# Patient Record
Sex: Male | Born: 1960 | Race: Asian | Hispanic: No | Marital: Married | Smoking: Current every day smoker
Health system: Southern US, Community
[De-identification: ages and names within clinical notes are randomized; demographics above are authoritative.]

## PROBLEM LIST (undated history)

## (undated) DIAGNOSIS — G4700 Insomnia, unspecified: Secondary | ICD-10-CM

## (undated) DIAGNOSIS — E119 Type 2 diabetes mellitus without complications: Secondary | ICD-10-CM

## (undated) DIAGNOSIS — E041 Nontoxic single thyroid nodule: Secondary | ICD-10-CM

## (undated) DIAGNOSIS — E785 Hyperlipidemia, unspecified: Secondary | ICD-10-CM

## (undated) DIAGNOSIS — I2510 Atherosclerotic heart disease of native coronary artery without angina pectoris: Secondary | ICD-10-CM

## (undated) DIAGNOSIS — E039 Hypothyroidism, unspecified: Secondary | ICD-10-CM

## (undated) DIAGNOSIS — K219 Gastro-esophageal reflux disease without esophagitis: Secondary | ICD-10-CM

## (undated) HISTORY — DX: Type 2 diabetes mellitus without complications: E11.9

## (undated) HISTORY — DX: Hypothyroidism, unspecified: E03.9

## (undated) HISTORY — DX: Hyperlipidemia, unspecified: E78.5

## (undated) HISTORY — DX: Gastro-esophageal reflux disease without esophagitis: K21.9

---

## 2002-05-21 ENCOUNTER — Emergency Department (HOSPITAL_COMMUNITY): Admission: EM | Admit: 2002-05-21 | Discharge: 2002-05-21 | Payer: Self-pay | Admitting: Emergency Medicine

## 2002-10-15 ENCOUNTER — Encounter: Payer: Self-pay | Admitting: Emergency Medicine

## 2002-10-15 ENCOUNTER — Emergency Department (HOSPITAL_COMMUNITY): Admission: EM | Admit: 2002-10-15 | Discharge: 2002-10-15 | Payer: Self-pay | Admitting: Emergency Medicine

## 2020-02-25 ENCOUNTER — Other Ambulatory Visit: Payer: Self-pay | Admitting: Internal Medicine

## 2020-02-25 DIAGNOSIS — E042 Nontoxic multinodular goiter: Secondary | ICD-10-CM

## 2020-03-02 ENCOUNTER — Ambulatory Visit
Admission: RE | Admit: 2020-03-02 | Discharge: 2020-03-02 | Disposition: A | Discharge status: Home / Self Care | Payer: BC Managed Care – PPO | Source: Ambulatory Visit | Attending: Internal Medicine | Admitting: Internal Medicine

## 2020-03-02 DIAGNOSIS — E042 Nontoxic multinodular goiter: Secondary | ICD-10-CM

## 2020-03-09 NOTE — Progress Notes (Signed)
Patient referred by Julian Hy, PA* for chest pain  Subjective:   Charles Dorsey, male    DOB: 11/28/1960, 59 y.o.   MRN: [id number]   Chief Complaint  Patient presents with  . Chest Pain  . New Patient (Initial Visit)     HPI  59 y.o. [place] male with hypertension, hyperlipidemia, type 2 DM, family history of CAD, referred for evaluation of chest pain.  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  He has noticed left-sided chest pain only with physical activity such as climbing up stairs or lifting any heavy objects.  Episodes last for 1 to 2 minutes, and resolve with rest.  While he has had these episodes about once a month for last year or so, they have been frequent, occurring on a daily basis for last week or 2.  Patient is on treatment for diabetes with Metformin, with improvement in his A1c, as reported by him.  He has been off his lipid-lowering pravastatin medication for 2 weeks.  He is not on any antihypertensive therapy at baseline, and states that his blood pressure is usually normal.  He smokes 3 cigarettes a day for last 35 years.  His father had CABG at age 47.   Past Medical History:  Diagnosis Date  . Acid reflux   . Diabetes mellitus without complication ([place])   . Hyperlipidemia   . Hypothyroidism      History reviewed. No pertinent surgical history.   Social History   Tobacco Use  Smoking Status Current Every Day Smoker  Smokeless Tobacco Never Used    Social History   Substance and Sexual Activity  Alcohol Use Not on file     Family History  Problem Relation Age of Onset  . Heart disease Father   . Diabetes Father   . Hyperlipidemia Father      Current Outpatient Medications on File Prior to Visit  Medication Sig Dispense Refill  . DEXILANT 60 MG capsule Take 1 capsule by mouth daily.    Marland Kitchen levothyroxine (SYNTHROID) 50 MCG tablet Take 50 mcg by mouth every morning.     . metFORMIN (GLUCOPHAGE) 500 MG tablet Take 500 mg by mouth 2 (two) times daily.    . pravastatin (PRAVACHOL) 10 MG tablet Take 10 mg by mouth at bedtime.     No current facility-administered medications on file prior to visit.    Cardiovascular and other pertinent studies:  EKG 03/10/2020: Sinus rhythm 72 bpm. Nonspecific T wave changes anteroseptal leads.   Recent labs: 02/01/2020: Glucose 142, BUN/Cr 19/1.0. EGFR 77. Na/K 137/4.3. Rest of the CMP normal H/H 15/47. MCV 94. Platelets 254 HbA1C N/A Chol 199, TG 112, HDL 44, LDL 133 TSH 4.3 normal    Review of Systems  Cardiovascular: Positive for chest pain. Negative for dyspnea on exertion, leg swelling, palpitations and syncope.         Vitals:   03/10/20 0831 03/10/20 0837  BP: (!) 153/98 (!) 140/92  Pulse: 73 74  Resp: 17   Temp: (!) 93.5 F (34.2 C)   SpO2: 97%     Body mass index is 23.89 kg/m. Filed Weights   03/10/20 0831  Weight: 148 lb (67.1 kg)     Objective:   Physical Exam  Constitutional: He appears well-developed and well-nourished.  Neck: No JVD present.  Cardiovascular: Normal rate, regular rhythm, normal heart sounds and intact distal pulses.  No murmur heard.  Pulmonary/Chest: Effort normal and breath sounds normal. He has no wheezes. He has no rales.  Musculoskeletal:        General: No edema.  Nursing note and vitals reviewed.        Assessment & Recommendations:   59 y.o. [place] male with hypertension, hyperlipidemia, type 2 DM, family history of CAD, referred for evaluation of chest pain.  Angina: Symptoms very typical of angina.  While he likely has stable angina for several months, his recent increase in frequency of symptoms are suggestive of unstable angina. Started aspirin 81 mg daily, Lipitor 40 mg daily, indoor 30 mg daily, and as needed sublingual nitroglycerin. I discussed management options including medical therapy and noninvasive testing versus medical therapy  and early invasive approach.  Given his unstable angina picture, early invasive approach is recommended. I have informed the patient that if he has resolution of symptoms on above medical therapy, he can contact us.  In that case, I will obtain noninvasive testing and see him for follow-up.  Otherwise, proceed with coronary angiography and possible intervention for next week.   Continue management of DM as per PCP recommendations.   Thank you for referring the patient to [place]. Please feel free to contact with any questions.  [name], MD [facility] Pager: [phone] Office: [phone]

## 2020-03-10 ENCOUNTER — Other Ambulatory Visit: Payer: Self-pay | Admitting: Internal Medicine

## 2020-03-10 ENCOUNTER — Encounter: Payer: Self-pay | Admitting: Cardiology

## 2020-03-10 ENCOUNTER — Ambulatory Visit: Payer: BC Managed Care – PPO | Admitting: Cardiology

## 2020-03-10 ENCOUNTER — Other Ambulatory Visit: Payer: Self-pay

## 2020-03-10 VITALS — BP 140/92 | HR 74 | Temp 93.5°F | Resp 17 | Ht 66.0 in | Wt 148.0 lb

## 2020-03-10 DIAGNOSIS — I200 Unstable angina: Secondary | ICD-10-CM

## 2020-03-10 DIAGNOSIS — R079 Chest pain, unspecified: Secondary | ICD-10-CM | POA: Insufficient documentation

## 2020-03-10 DIAGNOSIS — E042 Nontoxic multinodular goiter: Secondary | ICD-10-CM

## 2020-03-10 MED ORDER — ISOSORBIDE MONONITRATE ER 30 MG PO TB24: 30.0000 mg | ORAL_TABLET | Freq: Every day | ORAL | 3 refills | Status: DC

## 2020-03-10 MED ORDER — ASPIRIN EC 81 MG PO TBEC: 81.0000 mg | DELAYED_RELEASE_TABLET | Freq: Every day | ORAL | 2 refills | Status: DC

## 2020-03-10 MED ORDER — ATORVASTATIN CALCIUM 40 MG PO TABS: 40.0000 mg | ORAL_TABLET | Freq: Every day | ORAL | 2 refills | Status: DC

## 2020-03-10 MED ORDER — NITROGLYCERIN 0.4 MG SL SUBL: 0.4000 mg | SUBLINGUAL_TABLET | SUBLINGUAL | 3 refills | Status: DC | PRN

## 2020-03-15 ENCOUNTER — Other Ambulatory Visit (HOSPITAL_COMMUNITY)
Admission: RE | Admit: 2020-03-15 | Discharge: 2020-03-15 | Disposition: A | Discharge status: Home / Self Care | Payer: BC Managed Care – PPO | Source: Ambulatory Visit | Attending: Cardiology | Admitting: Cardiology

## 2020-03-15 DIAGNOSIS — Z01812 Encounter for preprocedural laboratory examination: Secondary | ICD-10-CM | POA: Diagnosis present

## 2020-03-15 DIAGNOSIS — Z20822 Contact with and (suspected) exposure to covid-19: Secondary | ICD-10-CM | POA: Diagnosis not present

## 2020-03-15 LAB — SARS CORONAVIRUS 2 (TAT 6-24 HRS): SARS Coronavirus 2: NEGATIVE

## 2020-03-16 LAB — CBC
Hematocrit: 45.9 % (ref 37.5–51.0)
Hemoglobin: 15.5 g/dL (ref 13.0–17.7)
MCH: 30.9 pg (ref 26.6–33.0)
MCHC: 33.8 g/dL (ref 31.5–35.7)
MCV: 92 fL (ref 79–97)
Platelets: 282 10*3/uL (ref 150–450)
RBC: 5.01 x10E6/uL (ref 4.14–5.80)
RDW: 12 % (ref 11.6–15.4)
WBC: 5.9 10*3/uL (ref 3.4–10.8)

## 2020-03-18 ENCOUNTER — Ambulatory Visit (HOSPITAL_COMMUNITY)
Admission: RE | Admit: 2020-03-18 | Discharge: 2020-03-19 | Disposition: A | Discharge status: Home / Self Care | Payer: BC Managed Care – PPO | Attending: Cardiology | Admitting: Cardiology

## 2020-03-18 ENCOUNTER — Encounter (HOSPITAL_COMMUNITY)
Admission: RE | Disposition: A | Discharge status: Home / Self Care | Payer: Self-pay | Source: Home / Self Care | Attending: Cardiology

## 2020-03-18 ENCOUNTER — Other Ambulatory Visit: Payer: Self-pay

## 2020-03-18 DIAGNOSIS — R079 Chest pain, unspecified: Secondary | ICD-10-CM

## 2020-03-18 DIAGNOSIS — I10 Essential (primary) hypertension: Secondary | ICD-10-CM | POA: Insufficient documentation

## 2020-03-18 DIAGNOSIS — K219 Gastro-esophageal reflux disease without esophagitis: Secondary | ICD-10-CM | POA: Diagnosis not present

## 2020-03-18 DIAGNOSIS — Z8249 Family history of ischemic heart disease and other diseases of the circulatory system: Secondary | ICD-10-CM | POA: Diagnosis not present

## 2020-03-18 DIAGNOSIS — F17210 Nicotine dependence, cigarettes, uncomplicated: Secondary | ICD-10-CM | POA: Diagnosis not present

## 2020-03-18 DIAGNOSIS — Z7982 Long term (current) use of aspirin: Secondary | ICD-10-CM | POA: Insufficient documentation

## 2020-03-18 DIAGNOSIS — I25110 Atherosclerotic heart disease of native coronary artery with unstable angina pectoris: Secondary | ICD-10-CM | POA: Diagnosis present

## 2020-03-18 DIAGNOSIS — E785 Hyperlipidemia, unspecified: Secondary | ICD-10-CM

## 2020-03-18 DIAGNOSIS — Z79890 Hormone replacement therapy (postmenopausal): Secondary | ICD-10-CM | POA: Diagnosis not present

## 2020-03-18 DIAGNOSIS — S55091A Other specified injury of ulnar artery at forearm level, right arm, initial encounter: Secondary | ICD-10-CM | POA: Diagnosis not present

## 2020-03-18 DIAGNOSIS — E039 Hypothyroidism, unspecified: Secondary | ICD-10-CM | POA: Diagnosis not present

## 2020-03-18 DIAGNOSIS — E119 Type 2 diabetes mellitus without complications: Secondary | ICD-10-CM | POA: Insufficient documentation

## 2020-03-18 DIAGNOSIS — Z79899 Other long term (current) drug therapy: Secondary | ICD-10-CM | POA: Diagnosis not present

## 2020-03-18 DIAGNOSIS — S5011XA Contusion of right forearm, initial encounter: Secondary | ICD-10-CM | POA: Insufficient documentation

## 2020-03-18 DIAGNOSIS — Z955 Presence of coronary angioplasty implant and graft: Secondary | ICD-10-CM

## 2020-03-18 DIAGNOSIS — X58XXXA Exposure to other specified factors, initial encounter: Secondary | ICD-10-CM | POA: Insufficient documentation

## 2020-03-18 DIAGNOSIS — Z7984 Long term (current) use of oral hypoglycemic drugs: Secondary | ICD-10-CM | POA: Insufficient documentation

## 2020-03-18 DIAGNOSIS — I200 Unstable angina: Secondary | ICD-10-CM

## 2020-03-18 DIAGNOSIS — Z9861 Coronary angioplasty status: Secondary | ICD-10-CM

## 2020-03-18 HISTORY — PX: LEFT HEART CATH AND CORONARY ANGIOGRAPHY: CATH118249

## 2020-03-18 HISTORY — PX: CORONARY STENT INTERVENTION: CATH118234

## 2020-03-18 HISTORY — PX: INTRAVASCULAR ULTRASOUND/IVUS: CATH118244

## 2020-03-18 LAB — BASIC METABOLIC PANEL
Anion gap: 8 (ref 5–15)
BUN: 16 mg/dL (ref 6–20)
CO2: 26 mmol/L (ref 22–32)
Calcium: 9.2 mg/dL (ref 8.9–10.3)
Chloride: 105 mmol/L (ref 98–111)
Creatinine, Ser: 0.73 mg/dL (ref 0.61–1.24)
GFR calc Af Amer: >60 mL/min (ref >60)
GFR calc non Af Amer: >60 mL/min (ref >60)
Glucose, Bld: 138 mg/dL — ABNORMAL HIGH (ref 70–99)
Potassium: 4.1 mmol/L (ref 3.5–5.1)
Sodium: 139 mmol/L (ref 135–145)

## 2020-03-18 LAB — GLUCOSE, CAPILLARY
Glucose-Capillary: 127 mg/dL — ABNORMAL HIGH (ref 70–99)
Glucose-Capillary: 81 mg/dL (ref 70–99)

## 2020-03-18 SURGERY — LEFT HEART CATH AND CORONARY ANGIOGRAPHY
Anesthesia: LOCAL

## 2020-03-18 MED ORDER — LIDOCAINE HCL (PF) 1 % IJ SOLN
INTRAMUSCULAR | Status: AC
  Filled 2020-03-18: qty 30

## 2020-03-18 MED ORDER — ACETAMINOPHEN 325 MG PO TABS: 650.0000 mg | ORAL_TABLET | ORAL | Status: DC | PRN

## 2020-03-18 MED ORDER — METOPROLOL TARTRATE 25 MG PO TABS
25.0000 mg | ORAL_TABLET | Freq: Two times a day (BID) | ORAL | Status: DC
  Administered 2020-03-18 – 2020-03-19 (×2): 25 mg via ORAL
  Filled 2020-03-18 (×2): qty 1

## 2020-03-18 MED ORDER — HEPARIN (PORCINE) IN NACL 1000-0.9 UT/500ML-% IV SOLN
INTRAVENOUS | Status: DC | PRN
  Administered 2020-03-18 (×2): 500 mL

## 2020-03-18 MED ORDER — NITROGLYCERIN 1 MG/10 ML FOR IR/CATH LAB
INTRA_ARTERIAL | Status: DC | PRN
  Administered 2020-03-18 (×3): 200 ug via INTRACORONARY

## 2020-03-18 MED ORDER — TICAGRELOR 90 MG PO TABS
ORAL_TABLET | ORAL | Status: AC
  Filled 2020-03-18: qty 1

## 2020-03-18 MED ORDER — TICAGRELOR 90 MG PO TABS
ORAL_TABLET | ORAL | Status: DC | PRN
  Administered 2020-03-18: 180 mg via ORAL

## 2020-03-18 MED ORDER — LEVOTHYROXINE SODIUM 50 MCG PO TABS
50.0000 ug | ORAL_TABLET | Freq: Every day | ORAL | Status: DC
  Administered 2020-03-19: 50 ug via ORAL
  Filled 2020-03-18: qty 1

## 2020-03-18 MED ORDER — HEPARIN SODIUM (PORCINE) 1000 UNIT/ML IJ SOLN
INTRAMUSCULAR | Status: AC
  Filled 2020-03-18: qty 1

## 2020-03-18 MED ORDER — DIPHENHYDRAMINE HCL 50 MG/ML IJ SOLN
INTRAMUSCULAR | Status: DC | PRN
  Administered 2020-03-18: 25 mg via INTRAVENOUS

## 2020-03-18 MED ORDER — SODIUM CHLORIDE 0.9% FLUSH: 3.0000 mL | INTRAVENOUS | Status: DC | PRN

## 2020-03-18 MED ORDER — ASPIRIN 81 MG PO CHEW
81.0000 mg | CHEWABLE_TABLET | ORAL | Status: AC
  Administered 2020-03-18: 81 mg via ORAL
  Filled 2020-03-18: qty 1

## 2020-03-18 MED ORDER — HEPARIN (PORCINE) IN NACL 1000-0.9 UT/500ML-% IV SOLN
INTRAVENOUS | Status: AC
  Filled 2020-03-18: qty 1000

## 2020-03-18 MED ORDER — SODIUM CHLORIDE 0.9 % WEIGHT BASED INFUSION: 1.0000 mL/kg/h | INTRAVENOUS | Status: DC

## 2020-03-18 MED ORDER — NITROGLYCERIN 1 MG/10 ML FOR IR/CATH LAB
INTRA_ARTERIAL | Status: AC
  Filled 2020-03-18: qty 10

## 2020-03-18 MED ORDER — PANTOPRAZOLE SODIUM 40 MG PO TBEC
40.0000 mg | DELAYED_RELEASE_TABLET | Freq: Every day | ORAL | Status: DC
  Administered 2020-03-18 – 2020-03-19 (×2): 40 mg via ORAL
  Filled 2020-03-18 (×2): qty 1

## 2020-03-18 MED ORDER — FENTANYL CITRATE (PF) 100 MCG/2ML IJ SOLN
INTRAMUSCULAR | Status: AC
  Filled 2020-03-18: qty 2

## 2020-03-18 MED ORDER — MIDAZOLAM HCL 2 MG/2ML IJ SOLN
INTRAMUSCULAR | Status: AC
  Filled 2020-03-18: qty 2

## 2020-03-18 MED ORDER — VERAPAMIL HCL 2.5 MG/ML IV SOLN: INTRAVENOUS | Status: DC | PRN

## 2020-03-18 MED ORDER — HEPARIN SODIUM (PORCINE) 1000 UNIT/ML IJ SOLN
INTRAMUSCULAR | Status: DC | PRN
  Administered 2020-03-18: 3500 [IU] via INTRAVENOUS
  Administered 2020-03-18: 3000 [IU] via INTRAVENOUS
  Administered 2020-03-18: 4000 [IU] via INTRAVENOUS
  Administered 2020-03-18: 3500 [IU] via INTRAVENOUS

## 2020-03-18 MED ORDER — SODIUM CHLORIDE 0.9 % WEIGHT BASED INFUSION
3.0000 mL/kg/h | INTRAVENOUS | Status: AC
  Administered 2020-03-18: 3 mL/kg/h via INTRAVENOUS

## 2020-03-18 MED ORDER — ONDANSETRON HCL 4 MG/2ML IJ SOLN: 4.0000 mg | Freq: Four times a day (QID) | INTRAMUSCULAR | Status: DC | PRN

## 2020-03-18 MED ORDER — SODIUM CHLORIDE 0.9 % IV SOLN: INTRAVENOUS | Status: AC

## 2020-03-18 MED ORDER — TICAGRELOR 90 MG PO TABS
90.0000 mg | ORAL_TABLET | Freq: Two times a day (BID) | ORAL | Status: DC
  Administered 2020-03-18 – 2020-03-19 (×2): 90 mg via ORAL
  Filled 2020-03-18 (×2): qty 1

## 2020-03-18 MED ORDER — ATORVASTATIN CALCIUM 40 MG PO TABS
40.0000 mg | ORAL_TABLET | Freq: Every day | ORAL | Status: DC
  Administered 2020-03-19: 40 mg via ORAL
  Filled 2020-03-18: qty 1

## 2020-03-18 MED ORDER — VERAPAMIL HCL 2.5 MG/ML IV SOLN
INTRAVENOUS | Status: AC
  Filled 2020-03-18: qty 2

## 2020-03-18 MED ORDER — LIDOCAINE HCL (PF) 1 % IJ SOLN
INTRAMUSCULAR | Status: DC | PRN
  Administered 2020-03-18: 2 mL

## 2020-03-18 MED ORDER — SODIUM CHLORIDE 0.9 % IV SOLN: 250.0000 mL | INTRAVENOUS | Status: DC | PRN

## 2020-03-18 MED ORDER — MIDAZOLAM HCL 2 MG/2ML IJ SOLN
INTRAMUSCULAR | Status: DC | PRN
  Administered 2020-03-18 (×3): 1 mg via INTRAVENOUS

## 2020-03-18 MED ORDER — IOHEXOL 350 MG/ML SOLN
INTRAVENOUS | Status: DC | PRN
  Administered 2020-03-18: 180 mL

## 2020-03-18 MED ORDER — HYDRALAZINE HCL 20 MG/ML IJ SOLN: 10.0000 mg | INTRAMUSCULAR | Status: AC | PRN

## 2020-03-18 MED ORDER — ASPIRIN EC 81 MG PO TBEC
81.0000 mg | DELAYED_RELEASE_TABLET | Freq: Every day | ORAL | Status: DC
  Administered 2020-03-19: 81 mg via ORAL
  Filled 2020-03-18: qty 1

## 2020-03-18 MED ORDER — SODIUM CHLORIDE 0.9% FLUSH: 3.0000 mL | Freq: Two times a day (BID) | INTRAVENOUS | Status: DC

## 2020-03-18 MED ORDER — NITROGLYCERIN 0.4 MG SL SUBL: 0.4000 mg | SUBLINGUAL_TABLET | SUBLINGUAL | Status: DC | PRN

## 2020-03-18 MED ORDER — FENTANYL CITRATE (PF) 100 MCG/2ML IJ SOLN
INTRAMUSCULAR | Status: DC | PRN
  Administered 2020-03-18 (×2): 25 ug via INTRAVENOUS
  Administered 2020-03-18: 50 ug via INTRAVENOUS

## 2020-03-18 MED ORDER — LABETALOL HCL 5 MG/ML IV SOLN: 10.0000 mg | INTRAVENOUS | Status: AC | PRN

## 2020-03-18 MED ORDER — SODIUM CHLORIDE 0.9% FLUSH
3.0000 mL | Freq: Two times a day (BID) | INTRAVENOUS | Status: DC
  Administered 2020-03-18: 3 mL via INTRAVENOUS

## 2020-03-18 MED ORDER — DIPHENHYDRAMINE HCL 50 MG/ML IJ SOLN
INTRAMUSCULAR | Status: AC
  Filled 2020-03-18: qty 1

## 2020-03-18 SURGICAL SUPPLY — 30 items
BALLN SAPPHIRE 2.0X10 (BALLOONS) ×2
BALLN SAPPHIRE 2.5X15 (BALLOONS) ×2
BALLN SAPPHIRE ~~LOC~~ 2.25X10 (BALLOONS) ×2 IMPLANT
BALLN SAPPHIRE ~~LOC~~ 3.0X12 (BALLOONS) ×2 IMPLANT
BALLN WOLVERINE 2.00X6 (BALLOONS) ×2
BALLN WOLVERINE 2.50X6 (BALLOONS) ×2
BALLOON SAPPHIRE 2.0X10 (BALLOONS) ×1 IMPLANT
BALLOON SAPPHIRE 2.5X15 (BALLOONS) ×1 IMPLANT
BALLOON WOLVERINE 2.00X6 (BALLOONS) ×1 IMPLANT
BALLOON WOLVERINE 2.50X6 (BALLOONS) ×1 IMPLANT
CATH INFINITI 5FR ANG PIGTAIL (CATHETERS) ×2 IMPLANT
CATH OPTICROSS HD (CATHETERS) ×2 IMPLANT
CATH OPTITORQUE TIG 4.0 5F (CATHETERS) ×2 IMPLANT
CATH VISTA GUIDE 6FR XBLAD3.5 (CATHETERS) ×2 IMPLANT
DEVICE RAD COMP TR BAND LRG (VASCULAR PRODUCTS) ×2 IMPLANT
GLIDESHEATH SLEND A-KIT 6F 22G (SHEATH) ×2 IMPLANT
GUIDEWIRE INQWIRE 1.5J.035X260 (WIRE) ×1 IMPLANT
INQWIRE 1.5J .035X260CM (WIRE) ×2
KIT ENCORE 26 ADVANTAGE (KITS) ×2 IMPLANT
KIT HEART LEFT (KITS) ×2 IMPLANT
KIT HEMO VALVE WATCHDOG (MISCELLANEOUS) ×2 IMPLANT
PACK CARDIAC CATHETERIZATION (CUSTOM PROCEDURE TRAY) ×2 IMPLANT
SHEATH GLIDE SLENDER 4/5FR (SHEATH) ×2 IMPLANT
SHEATH PROBE COVER 6X72 (BAG) ×2 IMPLANT
SLED PULL BACK IVUS (MISCELLANEOUS) ×2 IMPLANT
STENT RESOLUTE ONYX 2.0X12 (Permanent Stent) ×2 IMPLANT
STENT RESOLUTE ONYX 2.25X26 (Permanent Stent) ×2 IMPLANT
TRANSDUCER W/STOPCOCK (MISCELLANEOUS) ×2 IMPLANT
TUBING CIL FLEX 10 FLL-RA (TUBING) ×2 IMPLANT
WIRE ASAHI PROWATER 180CM (WIRE) ×4 IMPLANT

## 2020-03-18 NOTE — Progress Notes (Signed)
Dr. Clotilde Dieter from vascular at bedside.

## 2020-03-18 NOTE — Consult Note (Signed)
Hospital Consult    Reason for Consult:  Concern for arterial injury to right hand after ulnar artery access for PCI Referring Physician:  Dr. Rosemary Holms MRN #:  [id number]  History of Present Illness: This is a 59 y.o. male with history of hypertension, hyperlipidemia, diabetes that vascular surgery has been consulted with concern for right upper extremity arterial injury following percutaneous ulnar access for cath and PCI.  Most of the history obtained from the RN at bedside.  She reports that left ulnar access was performed for percutaneous coronary intervention.  A TR band was placed at completion of the case.  Apparently radial access was attempted? Patient was noted to have a hematoma in PACU in right forearm and a blood pressure cuff was inflated to 80 mmHg for at least 1 hour.  Vascular surgery was consulted.  He reports some weakness and numbness in left hand improving since blood pressure cuff was deflated.   Past Medical History:  Diagnosis Date  . Acid reflux   . Diabetes mellitus without complication ([facility])   . Hyperlipidemia   . Hypothyroidism     No past surgical history on file.  No Known Allergies  Prior to Admission medications   Medication Sig Start Date End Date Taking? Authorizing Provider  aspirin EC 81 MG tablet Take 1 tablet (81 mg total) by mouth daily. 03/10/20  Yes Patwardhan, Manish J, MD  DEXILANT 60 MG capsule Take 60 mg by mouth daily.  02/09/20  Yes [provider]  isosorbide mononitrate (IMDUR) 30 MG 24 hr tablet Take 1 tablet (30 mg total) by mouth daily. 03/10/20 06/08/20 Yes Patwardhan, Anabel Bene, MD  levothyroxine (SYNTHROID) 50 MCG tablet Take 50 mcg by mouth daily before breakfast.  02/20/20  Yes [provider]  metFORMIN (GLUCOPHAGE) 500 MG tablet Take 500 mg by mouth 2 (two) times daily. 03/07/20  Yes [provider]  nitroGLYCERIN (NITROSTAT) 0.4 MG SL tablet Place 1 tablet (0.4 mg total) under the tongue every 5 (five)  minutes as needed for chest pain. 03/10/20 06/08/20 Yes Patwardhan, Manish J, MD  pravastatin (PRAVACHOL) 10 MG tablet Take 10 mg by mouth at bedtime. 03/07/20  Yes [provider]  atorvastatin (LIPITOR) 40 MG tablet Take 1 tablet (40 mg total) by mouth daily. 03/10/20 03/10/21  Elder Negus, MD    Social History   Socioeconomic History  . Marital status: Married    Spouse name: Not on file  . Number of children: 5  . Years of education: Not on file  . Highest education level: Not on file  Occupational History  . Not on file  Tobacco Use  . Smoking status: Current Every Day Smoker  . Smokeless tobacco: Never Used  Substance and Sexual Activity  . Alcohol use: Yes    Comment: occ  . Drug use: Never  . Sexual activity: Not on file  Other Topics Concern  . Not on file  Social History Narrative  . Not on file   Social Determinants of Health   Financial Resource Strain:   . Difficulty of Paying Living Expenses:   Food Insecurity:   . Worried About Programme researcher, broadcasting/film/video in the Last Year:   . Barista in the Last Year:   Transportation Needs:   . Freight forwarder (Medical):   Marland Kitchen Lack of Transportation (Non-Medical):   Physical Activity:   . Days of Exercise per Week:   . Minutes of Exercise per Session:  Stress:   . Feeling of Stress :   Social Connections:   . Frequency of Communication with Friends and Family:   . Frequency of Social Gatherings with Friends and Family:   . Attends Religious Services:   . Active Member of Clubs or Organizations:   . Attends Archivist Meetings:   Marland Kitchen Marital Status:   Intimate Partner Violence:   . Fear of Current or Ex-Partner:   . Emotionally Abused:   Marland Kitchen Physically Abused:   . Sexually Abused:      Family History  Problem Relation Age of Onset  . Heart disease Father   . Diabetes Father   . Hyperlipidemia Father     ROS: [x]  Positive   [ ]  Negative   [ ]  All sytems reviewed and are  negative  Cardiovascular: []  chest pain/pressure []  palpitations []  SOB lying flat []  DOE []  pain in legs while walking []  pain in legs at rest []  pain in legs at night []  non-healing ulcers []  hx of DVT []  swelling in legs  Pulmonary: []  productive cough []  asthma/wheezing []  home O2  Neurologic: []  weakness in []  arms []  legs []  numbness in []  arms []  legs []  hx of CVA []  mini stroke [] difficulty speaking or slurred speech []  temporary loss of vision in one eye []  dizziness  Hematologic: []  hx of cancer []  bleeding problems []  problems with blood clotting easily  Endocrine:   []  diabetes []  thyroid disease  GI []  vomiting blood []  blood in stool  GU: []  CKD/renal failure []  HD--[]  M/W/F or []  T/T/S []  burning with urination []  blood in urine  Psychiatric: []  anxiety []  depression  Musculoskeletal: []  arthritis []  joint pain  Integumentary: []  rashes []  ulcers  Constitutional: []  fever []  chills   Physical Examination  Vitals:   03/18/20 1800 03/18/20 1805  BP: (!) 162/65 (!) 149/73  Pulse: (!) 59 63  Resp: 18 17  Temp:    SpO2: 100% 100%   Body mass index is 23.08 kg/m.  General:  NAD Gait: Not observed HENT: WNL, normocephalic Pulmonary: normal non-labored breathing, without Rales, rhonchi,  wheezing Cardiac: regular, without  Murmurs, rubs or gallops Abdomen: soft, NT/ND, no masses Vascular Exam/Pulses: Right radial and ulnar artery are palpable at the wrist just before the TR band.  The hand is warm with good cap refill.  There is a palmar arch signal.  His motor and sensation are improving with better grip strength since the blood pressure cuff was deflated to the forearm. Does have a hematoma in the forearm likely given fullness.  I do not think he has a compartment syndrome at the same time.  He has no pain with passive range of motion of the fingers. Musculoskeletal: no muscle wasting or atrophy     CBC    Component Value  Date/Time   WBC 5.9 03/15/2020 0830   RBC 5.01 03/15/2020 0830   HGB 15.5 03/15/2020 0830   HCT 45.9 03/15/2020 0830   PLT 282 03/15/2020 0830   MCV 92 03/15/2020 0830   MCH 30.9 03/15/2020 0830   MCHC 33.8 03/15/2020 0830   RDW 12.0 03/15/2020 0830    BMET    Component Value Date/Time   NA 139 03/18/2020 1202   K 4.1 03/18/2020 1202   CL 105 03/18/2020 1202   CO2 26 03/18/2020 1202   GLUCOSE 138 (H) 03/18/2020 1202   BUN 16 03/18/2020 1202   CREATININE 0.73 03/18/2020 1202  CALCIUM 9.2 03/18/2020 1202   GFRNONAA >60 03/18/2020 1202   GFRAA >60 03/18/2020 1202    COAGS: No results found for: INR, PROTIME   Non-Invasive Vascular Imaging:    None   ASSESSMENT/PLAN: This is a 59 y.o. male status post PCI via right ulnar artery access.  Vascular surgery was consulted with concern for vascular injury.  Noted to have forearm hematoma in recovery.  On my exam he has a palpable radial and ulnar pulse just before the TR band.  There is a good palmar arch signal.  His hand is warm.  I think his right hand weakness and numbness was from prolonged inflation of BP cuff on right forearm with likely some ischemic insult during that time.  This is improving. I have asked Dr. Merlyn Lot with hand surgery to look at his arm given concern for compartment syndrome in the forearm and I do not perform upper extremity fasciotomies.  Continue to keep his arm elevated.  We will follow closely.    Cephus Shelling, MD [facility] Office: [phone]

## 2020-03-18 NOTE — Progress Notes (Signed)
Subjective: Patient sitting up eating dinner.  States arm still with some pain, but improving.  Able to move fingers.   Objective: Vital signs in last 24 hours: Temp:  [98 F (36.7 C)-99.6 F (37.6 C)] 99.6 F (37.6 C) (04/22 2018) Pulse Rate:  [53-88] 59 (04/22 2018) Resp:  [0-88] 16 (04/22 2018) BP: (94-162)/(59-81) 118/59 (04/22 2018) SpO2:  [0 %-100 %] 97 % (04/22 2018) Weight:  [64.9 kg] 64.9 kg (04/22 1144)  Intake/Output from previous day: No intake/output data recorded. Intake/Output this shift: No intake/output data recorded.  No results for input(s): HGB in the last 72 hours. No results for input(s): WBC, RBC, HCT, PLT in the last 72 hours. Recent Labs    03/18/20 1202  NA 139  K 4.1  CL 105  CO2 26  BUN 16  CREATININE 0.73  GLUCOSE 138*  CALCIUM 9.2   No results for input(s): LABPT, INR in the last 72 hours.  Right UE: Intact sensation and capillary refill in digits.  Able to flex fingers into palm and fully extend.  Notes mild pain in forearm with full flexion or full extension.  Proximal forearm with swelling at muscle belly, but not tense.    Assessment/Plan: Right forearm swelling.  No compartment syndrome at this time.  Has had some improvement.  Will follow.     Betha Loa 03/18/2020, 8:32 PM

## 2020-03-18 NOTE — H&P (Signed)
OV 4/14     Patient referred by No ref. provider found for chest pain  Subjective:   Charles Dorsey, male    DOB: [birth year], 59 y.o.   MRN: [id number]   No chief complaint on file.    HPI  59 y.o. [place] male with hypertension, hyperlipidemia, type 2 DM, family history of CAD, referred for evaluation of chest pain.  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  He has noticed left-sided chest pain only with physical activity such as climbing up stairs or lifting any heavy objects.  Episodes last for 1 to 2 minutes, and resolve with rest.  While he has had these episodes about once a month for last year or so, they have been frequent, occurring on a daily basis for last week or 2.  Patient is on treatment for diabetes with Metformin, with improvement in his A1c, as reported by him.  He has been off his lipid-lowering pravastatin medication for 2 weeks.  He is not on any antihypertensive therapy at baseline, and states that his blood pressure is usually normal.  He smokes 3 cigarettes a day for last 35 years.  His father had CABG at age 50.   Past Medical History:  Diagnosis Date  . Acid reflux   . Diabetes mellitus without complication ([place])   . Hyperlipidemia   . Hypothyroidism      No past surgical history on file.   Social History   Tobacco Use  Smoking Status Current Every Day Smoker  Smokeless Tobacco Never Used    Social History   Substance and Sexual Activity  Alcohol Use Yes   Comment: occ     Family History  Problem Relation Age of Onset  . Heart disease Father   . Diabetes Father   . Hyperlipidemia Father      No current facility-administered medications on file prior to encounter.   Current Outpatient Medications on File Prior to Encounter  Medication Sig Dispense Refill  . DEXILANT 60 MG capsule Take 60 mg by mouth daily.     Marland Kitchen levothyroxine (SYNTHROID) 50 MCG tablet Take 50 mcg by  mouth daily before breakfast.     . metFORMIN (GLUCOPHAGE) 500 MG tablet Take 500 mg by mouth 2 (two) times daily.    . pravastatin (PRAVACHOL) 10 MG tablet Take 10 mg by mouth at bedtime.    Marland Kitchen aspirin EC 81 MG tablet Take 1 tablet (81 mg total) by mouth daily. 30 tablet 2  . atorvastatin (LIPITOR) 40 MG tablet Take 1 tablet (40 mg total) by mouth daily. 30 tablet 2  . isosorbide mononitrate (IMDUR) 30 MG 24 hr tablet Take 1 tablet (30 mg total) by mouth daily. 30 tablet 3  . nitroGLYCERIN (NITROSTAT) 0.4 MG SL tablet Place 1 tablet (0.4 mg total) under the tongue every 5 (five) minutes as needed for chest pain. 30 tablet 3    Cardiovascular and other pertinent studies:  EKG 03/10/2020: Sinus rhythm 72 bpm. Nonspecific T wave changes anteroseptal leads.   Recent labs: 02/01/2020: Glucose 142, BUN/Cr 19/1.0. EGFR 77. Na/K 137/4.3. Rest of the CMP normal H/H 15/47. MCV 94. Platelets 254 HbA1C N/A Chol 199, TG 112, HDL 44, LDL 133 TSH 4.3 normal    Review of Systems  Cardiovascular: Positive for chest pain. Negative for dyspnea on exertion, leg swelling, palpitations and syncope.         There were no vitals filed  for this visit.  There is no height or weight on file to calculate BMI. There were no vitals filed for this visit.   Objective:   Physical Exam  Constitutional: He appears well-developed and well-nourished.  Neck: No JVD present.  Cardiovascular: Normal rate, regular rhythm, normal heart sounds and intact distal pulses.  No murmur heard. Pulmonary/Chest: Effort normal and breath sounds normal. He has no wheezes. He has no rales.  Musculoskeletal:        General: No edema.  Nursing note and vitals reviewed.        Assessment & Recommendations:   59 y.o. [place] male with hypertension, hyperlipidemia, type 2 DM, family history of CAD, referred for evaluation of chest pain.  Angina: Symptoms very typical of angina.  While he likely has stable angina for  several months, his recent increase in frequency of symptoms are suggestive of unstable angina. Started aspirin 81 mg daily, Lipitor 40 mg daily, indoor 30 mg daily, and as needed sublingual nitroglycerin. I discussed management options including medical therapy and noninvasive testing versus medical therapy and early invasive approach.  Given his unstable angina picture, early invasive approach is recommended. I have informed the patient that if he has resolution of symptoms on above medical therapy, he can contact us.  In that case, I will obtain noninvasive testing and see him for follow-up.  Otherwise, proceed with coronary angiography and possible intervention for next week.   Continue management of DM as per PCP recommendations.   Thank you for referring the patient to [place]. Please feel free to contact with any questions.  [name], MD [facility] [place] Pager: [phone] Office: [phone]

## 2020-03-18 NOTE — Progress Notes (Signed)
Subjective: States arm feeling better.  Some pain, but minimal.  Objective: Vital signs in last 24 hours: Temp:  [98 F (36.7 C)-99.6 F (37.6 C)] 99.6 F (37.6 C) (04/22 2018) Pulse Rate:  [53-88] 65 (04/22 2129) Resp:  [0-88] 16 (04/22 2018) BP: (94-162)/(59-81) 129/63 (04/22 2129) SpO2:  [0 %-100 %] 97 % (04/22 2018) Weight:  [64 kg-64.9 kg] 64 kg (04/22 2018)  Intake/Output from previous day: No intake/output data recorded. Intake/Output this shift: No intake/output data recorded.  No results for input(s): HGB in the last 72 hours. No results for input(s): WBC, RBC, HCT, PLT in the last 72 hours. Recent Labs    03/18/20 1202  NA 139  K 4.1  CL 105  CO2 26  BUN 16  CREATININE 0.73  GLUCOSE 138*  CALCIUM 9.2   No results for input(s): LABPT, INR in the last 72 hours.  Intact sensation and capillary refill all digits.  Moving all fingers with nearly full ROM with small amount of pain.  Fully passively extensible fingers with minimal pain.    Assessment/Plan: No compartment syndrome.  Continues to improve.  TR band being removed by nursing staff.  Call if issues.     Charles Dorsey 03/18/2020, 11:46 PM

## 2020-03-18 NOTE — Progress Notes (Signed)
On arrival to holding area from procedural room, patient began complaining of right forearm tightness and pain. Right lower forearm tight, swollen in comparison to left forearm. Blood pressure cuff applied over entire right forearm and inflated. Notified Dr. Rosemary Holms who evaluated patient and consulted vascular surgery and ortho. Vascular surgeon coming to evaluate patient once out of surgery. Dr. Rosemary Holms asked for blood pressure cuff to be inflated to 10 mmHg above diastolic pressure. Inflated cuff to 85 mmHg. Radial and ulnar pulses are both able to be dopplered and have been marked. Dr. Rosemary Holms decreased TR band pressure to 5 cc air, no bleed back visible. Will await vascular evaluation and continue to monitor.

## 2020-03-18 NOTE — Progress Notes (Signed)
Dr. Chestine Spore and Dr. Merlyn Lot at bedside. Rt arm elevated on pillow. BP to remain off of rt arm per doctors. Sensation present rt hand and fingers. Rt hand warm. Good pleth w/probe on rt thumb.

## 2020-03-18 NOTE — Consult Note (Signed)
Charles Dorsey is an 59 y.o. male.   Chief Complaint: right forearm swelling HPI: 59 yo male s/p catheterization via ulnar artery this afternoon.  Concern arose regarding swelling in right forearm.  Consult called for evaluation possible compartment syndrome.  Has intermittently had blood pressure cuff inflated on right forearm.  Has pressure dressing at catheterization site at wrist.  He reports some discomfort in right forearm.  Seen by vascular surgery approximately 30 minutes ago.  He states the arm was more swollen initially after procedure.  Has improved since initial issue was noted.  No significant change in last 30 minutes.  Able to feel fingers and move all digits.  Patient speaks English well.  Case discussed with Monica Martinez, MD and his note from 03/18/2020 reviewed. Xrays viewed and interpreted by me: none Labs reviewed: none  Allergies: No Known Allergies  Past Medical History:  Diagnosis Date  . Acid reflux   . Diabetes mellitus without complication ([place])   . Hyperlipidemia   . Hypothyroidism     No past surgical history on file.  Family History: Family History  Problem Relation Age of Onset  . Heart disease Father   . Diabetes Father   . Hyperlipidemia Father     Social History:   reports that he has been smoking. He has never used smokeless tobacco. He reports current alcohol use. He reports that he does not use drugs.  Medications: Medications Prior to Admission  Medication Sig Dispense Refill  . aspirin EC 81 MG tablet Take 1 tablet (81 mg total) by mouth daily. 30 tablet 2  . DEXILANT 60 MG capsule Take 60 mg by mouth daily.     . isosorbide mononitrate (IMDUR) 30 MG 24 hr tablet Take 1 tablet (30 mg total) by mouth daily. 30 tablet 3  . levothyroxine (SYNTHROID) 50 MCG tablet Take 50 mcg by mouth daily before breakfast.     . metFORMIN (GLUCOPHAGE) 500 MG tablet Take 500 mg by mouth 2 (two) times daily.    . nitroGLYCERIN (NITROSTAT) 0.4 MG SL tablet  Place 1 tablet (0.4 mg total) under the tongue every 5 (five) minutes as needed for chest pain. 30 tablet 3  . pravastatin (PRAVACHOL) 10 MG tablet Take 10 mg by mouth at bedtime.    Marland Kitchen atorvastatin (LIPITOR) 40 MG tablet Take 1 tablet (40 mg total) by mouth daily. 30 tablet 2    Results for orders placed or performed during the hospital encounter of 03/18/20 (from the past 48 hour(s))  Glucose, capillary     Status: Abnormal   Collection Time: 03/18/20 11:47 AM  Result Value Ref Range   Glucose-Capillary 127 (H) 70 - 99 mg/dL    Comment: Glucose reference range applies only to samples taken after fasting for at least 8 hours.   Comment 1 Notify RN   Basic metabolic panel     Status: Abnormal   Collection Time: 03/18/20 12:02 PM  Result Value Ref Range   Sodium 139 135 - 145 mmol/L   Potassium 4.1 3.5 - 5.1 mmol/L   Chloride 105 98 - 111 mmol/L   CO2 26 22 - 32 mmol/L   Glucose, Bld 138 (H) 70 - 99 mg/dL    Comment: Glucose reference range applies only to samples taken after fasting for at least 8 hours.   BUN 16 6 - 20 mg/dL   Creatinine, Ser 0.73 0.61 - 1.24 mg/dL   Calcium 9.2 8.9 - 10.3 mg/dL   GFR calc  non Af Amer >60 >60 mL/min   GFR calc Af Amer >60 >60 mL/min   Anion gap 8 5 - 15    Comment: Performed at [facility], [address], [place], [place] [address]  Glucose, capillary     Status: None   Collection Time: 03/18/20  6:34 PM  Result Value Ref Range   Glucose-Capillary 81 70 - 99 mg/dL    Comment: Glucose reference range applies only to samples taken after fasting for at least 8 hours.    No results found.   A comprehensive review of systems was negative except for occasional headache Review of Systems: No fevers, chills, night sweats, chest pain, shortness of breath, nausea, vomiting, diarrhea, constipation, easy bleeding or bruising, dizziness, vision changes, fainting.   Blood pressure (!) 144/75, pulse 60, temperature 98 F (36.7 C), temperature  source Tympanic, resp. rate 20, height 5\' 6"  (1.676 m), weight 64.9 kg, SpO2 100 %.  General appearance: alert, cooperative and appears stated age Head: Normocephalic, without obvious abnormality, atraumatic Neck: supple, symmetrical, trachea midline Extremities: Intact sensation and capillary refill all digits.  +epl/fpl/io.  Able to move all digits.  Mild pain in forearm with full passive extension of digits.  Overall minimal pain with active or passive motion of fingers.  Proximal forearm with swelling but compartments do not feel tight. Pulses: 2+ and symmetric Skin: Skin color, texture, turgor normal. No rashes or lesions Neurologic: Grossly normal Incision/Wound: catheterization site at wrist with pressure dressing in place  Assessment/Plan Right forearm swelling s/p catheterization ulnar artery.  Do not suspect active compartment syndrome at this time.  Good motion in digits with minimal pain and minimal pain at rest.  Recommend observation at this time.  Will follow.  03/18/2020, 6:51 PM

## 2020-03-18 NOTE — Interval H&P Note (Signed)
History and Physical Interval Note:  03/18/2020 1:38 PM  TASHEEM ELMS  has presented today for surgery, with the diagnosis of unstable angina.  The various methods of treatment have been discussed with the patient and family. After consideration of risks, benefits and other options for treatment, the patient has consented to  Procedure(s): LEFT HEART CATH AND CORONARY ANGIOGRAPHY (N/A) as a surgical intervention.  The patient's history has been reviewed, patient examined, no change in status, stable for surgery.  I have reviewed the patient's chart and labs.  Questions were answered to the patient's satisfaction.    2016 Appropriate Use Criteria for Coronary Revascularization in Patients With Acute Coronary Syndrome NSTEMI/UA Intermediate Risk (TIMI Score 3-4) NSTEMI/Unstable angina, stabilized patient at Intermediate Risk (TIMI Score 3-4) Link Here: [employer] Indication:  Revascularization by PCI or CABG of 1 or more arteries in a patient with NSTEMI or unstable angina with Stabilization after presentation Intermediate risk for clinical events  A (7) Indication: 16; Score 7    Manish J Patwardhan

## 2020-03-18 NOTE — Progress Notes (Signed)
Subjective: Sleeping on arrival to room.  Easily woken.  States arm is feeling better.   Objective: Vital signs in last 24 hours: Temp:  [98 F (36.7 C)-99.6 F (37.6 C)] 99.6 F (37.6 C) (04/22 2018) Pulse Rate:  [53-88] 65 (04/22 2129) Resp:  [0-88] 16 (04/22 2018) BP: (94-162)/(59-81) 129/63 (04/22 2129) SpO2:  [0 %-100 %] 97 % (04/22 2018) Weight:  [64.9 kg] 64.9 kg (04/22 1144)  Intake/Output from previous day: No intake/output data recorded. Intake/Output this shift: No intake/output data recorded.  No results for input(s): HGB in the last 72 hours. No results for input(s): WBC, RBC, HCT, PLT in the last 72 hours. Recent Labs    03/18/20 1202  NA 139  K 4.1  CL 105  CO2 26  BUN 16  CREATININE 0.73  GLUCOSE 138*  CALCIUM 9.2   No results for input(s): LABPT, INR in the last 72 hours.  Intact sensation and capillary refill all digits.  Moving all fingers with minimal pain. Swelling in proximal forearm but compartments soft.    Assessment/Plan: No compartment syndrome.  Still with some swelling.  Improving.  Will follow.     Charles Dorsey 03/18/2020, 10:28 PM

## 2020-03-19 DIAGNOSIS — I10 Essential (primary) hypertension: Secondary | ICD-10-CM | POA: Diagnosis not present

## 2020-03-19 DIAGNOSIS — E119 Type 2 diabetes mellitus without complications: Secondary | ICD-10-CM | POA: Diagnosis not present

## 2020-03-19 DIAGNOSIS — I25110 Atherosclerotic heart disease of native coronary artery with unstable angina pectoris: Secondary | ICD-10-CM | POA: Diagnosis not present

## 2020-03-19 DIAGNOSIS — E785 Hyperlipidemia, unspecified: Secondary | ICD-10-CM | POA: Diagnosis not present

## 2020-03-19 LAB — BASIC METABOLIC PANEL
Anion gap: 10 (ref 5–15)
BUN: 12 mg/dL (ref 6–20)
CO2: 24 mmol/L (ref 22–32)
Calcium: 9.1 mg/dL (ref 8.9–10.3)
Chloride: 105 mmol/L (ref 98–111)
Creatinine, Ser: 0.86 mg/dL (ref 0.61–1.24)
GFR calc Af Amer: >60 mL/min (ref >60)
GFR calc non Af Amer: >60 mL/min (ref >60)
Glucose, Bld: 124 mg/dL — ABNORMAL HIGH (ref 70–99)
Potassium: 3.5 mmol/L (ref 3.5–5.1)
Sodium: 139 mmol/L (ref 135–145)

## 2020-03-19 LAB — CBC
HCT: 44.4 % (ref 39.0–52.0)
Hemoglobin: 15.2 g/dL (ref 13.0–17.0)
MCH: 31.1 pg (ref 26.0–34.0)
MCHC: 34.2 g/dL (ref 30.0–36.0)
MCV: 91 fL (ref 80.0–100.0)
Platelets: 262 10*3/uL (ref 150–400)
RBC: 4.88 MIL/uL (ref 4.22–5.81)
RDW: 12.1 % (ref 11.5–15.5)
WBC: 7.1 10*3/uL (ref 4.0–10.5)
nRBC: 0 % (ref 0.0–0.2)

## 2020-03-19 LAB — POCT ACTIVATED CLOTTING TIME
Activated Clotting Time: 351 seconds
Activated Clotting Time: 252 seconds
Activated Clotting Time: 235 seconds
Activated Clotting Time: 345 seconds

## 2020-03-19 MED ORDER — METOPROLOL TARTRATE 25 MG PO TABS: 25.0000 mg | ORAL_TABLET | Freq: Two times a day (BID) | ORAL | 2 refills | Status: DC

## 2020-03-19 MED ORDER — ASPIRIN EC 81 MG PO TBEC: 81.0000 mg | DELAYED_RELEASE_TABLET | Freq: Every day | ORAL | 2 refills | Status: DC

## 2020-03-19 MED ORDER — THE SENSUOUS HEART BOOK
Freq: Once | Status: AC
  Filled 2020-03-19: qty 1

## 2020-03-19 MED ORDER — ATORVASTATIN CALCIUM 40 MG PO TABS: 40.0000 mg | ORAL_TABLET | Freq: Every day | ORAL | 2 refills | Status: DC

## 2020-03-19 MED ORDER — TICAGRELOR 90 MG PO TABS: 90.0000 mg | ORAL_TABLET | Freq: Two times a day (BID) | ORAL | 2 refills | Status: DC

## 2020-03-19 MED ORDER — ANGIOPLASTY BOOK
Freq: Once | Status: AC
  Filled 2020-03-19: qty 1

## 2020-03-19 MED ORDER — NITROGLYCERIN 0.4 MG SL SUBL: 0.4000 mg | SUBLINGUAL_TABLET | SUBLINGUAL | 2 refills | Status: DC | PRN

## 2020-03-19 MED FILL — METOPROLOL TARTRATE 25 MG T: 25 | 30 days supply | Qty: 60 | Fill #0

## 2020-03-19 MED FILL — BRILINTA 90 MG TABLET: 90 | 30 days supply | Qty: 60 | Fill #0

## 2020-03-19 MED FILL — ASPIRIN LOW DOSE 81 MG TBEC: 81 | 30 days supply | Qty: 30 | Fill #0

## 2020-03-19 NOTE — Progress Notes (Signed)
TR BAND REMOVAL  LOCATION:    right radial  DEFLATED PER PROTOCOL:    Yes.    TIME BAND OFF / DRESSING APPLIED:   2300   SITE UPON ARRIVAL:    Level 2  SITE AFTER BAND REMOVAL:    Level 2  CIRCULATION SENSATION AND MOVEMENT:    Within Normal Limits   Yes.    COMMENTS:  Rt.arm with palpable pulses ,still with swelling to proximal forearm ,warm to touch ,with sensation & with capillary refill & with minimal pain .Will continue to monitor pt.

## 2020-03-19 NOTE — Care Management (Signed)
1019 03-19-20 Benefits check submitted for Brilinta. Case Manager will provide Brilinta discount card. No further needs from Case Manager at this time. Gala Lewandowsky, RN,BSN Case Manager

## 2020-03-19 NOTE — Progress Notes (Signed)
[facility]  Subjective  - numbness and weakness in right hand improved.   Objective 107/72 64 98.1 F (36.7 C) (Oral) 16 97% No intake or output data in the 24 hours ending 03/19/20 0802  Right radial and ulnar pulse palpable Hand warm Good cap refill Forearm softer - no signs of compartment syndrome  Laboratory Lab Results: Recent Labs    03/19/20 0355  WBC 7.1  HGB 15.2  HCT 44.4  PLT 262   BMET Recent Labs    03/18/20 1202 03/19/20 0355  NA 139 139  K 4.1 3.5  CL 105 105  CO2 26 24  GLUCOSE 138* 124*  BUN 16 12  CREATININE 0.73 0.86  CALCIUM 9.2 9.1    COAG No results found for: INR, PROTIME No results found for: PTT  Assessment/Planning:  Concern for arterial injury after ulnar access for PCI yesterday evening.  Continues to have palpable radial and ulnar pulse with palmar arch signal since TR band removed.  Motor and sensation improved since BP cuff removed from forearm last night.  Hand well perfused.  No signs of compartment syndrome, although some forearm hematoma as previously noted.  Forearm actually a bit softer.  Instructed to continue to elevate arm.  Discharge per cardiology.    Cephus Shelling 03/19/2020 8:02 AM --

## 2020-03-19 NOTE — Care Management (Signed)
Per Clarisse Gouge T.w/[facility]: co-pay amount for Brinlinta 90mg ,bid for a 30 day supply $47.00.  No PA required Deductible  Tier ? [facility]

## 2020-03-19 NOTE — Progress Notes (Signed)
Received pt.from cath.lab nurse ,AAO x4 ;with rt.arm swelling noted at the proximal forearm .Sensation present on rt.hand & fingers ,warm to touch & with capillary refill.Pt.is able to flex & extend fingers .,but with pain when flexion & extension of forearm.Will continue to elevated rt.arm with 2 pillows & to monitor rt.arm.

## 2020-03-19 NOTE — Discharge Instructions (Signed)
Angina  Angina is very bad discomfort or pain in the chest, neck, arm, jaw, or back. The discomfort is caused by a lack of blood in the middle layer of the heart wall (myocardium). What are the causes? This condition is caused by a buildup of fat and cholesterol (plaque) in your arteries (atherosclerosis). This buildup narrows the arteries and makes it hard for blood to flow. What increases the risk? You are more likely to develop this condition if:  You have high levels of cholesterol in your blood.  You have high blood pressure (hypertension).  You have diabetes.  You have a family history of heart disease.  You are not active, or you do not exercise enough.  You feel sad (depressed).  You have been treated with high energy rays (radiation) on the left side of your chest. Other risk factors are:  Using tobacco.  Being very overweight (obese).  Eating a diet high in unhealthy fats (saturated fats).  Having stress, or being exposed to things that cause stress.  Using drugs, such as cocaine. Women have a greater risk for angina if:  They are older than 55.  They have stopped having their period (are in postmenopause). What are the signs or symptoms? Common symptoms of this condition in both men and women may include:  Chest pain, which may: ? Feel like a crushing or squeezing in the chest. ? Feel like a tightness, pressure, fullness, or heaviness in the chest. ? Last for more than a few minutes at a time. ? Stop and come back (recur) after a few minutes.  Pain in the neck, arm, jaw, or back.  Heartburn or upset stomach (indigestion) for no reason.  Being short of breath.  Feeling sick to your stomach (nauseous).  Sudden cold sweats. Women and people with diabetes may have other symptoms that are not usual, such as feeling:  Tired (fatigue).  Worried or nervous (anxious) for no reason.  Weak for no reason.  Dizzy or passing out (fainting). How is this  treated? This condition may be treated with:  Medicines. These are given to: ? Prevent blood clots. ? Prevent heart attack. ? Relax blood vessels and improve blood flow to the heart (nitrates). ? Reduce blood pressure. ? Improve the pumping action of the heart. ? Reduce fat and cholesterol in the blood.  A procedure to widen a narrowed or blocked artery in the heart (angioplasty).  Surgery to allow blood to go around a blocked artery (coronary artery bypass surgery). Follow these instructions at home: Medicines  Take over-the-counter and prescription medicines only as told by your doctor.  Do not take these medicines unless your doctor says that you can: ? NSAIDs. These include:  Ibuprofen.  Naproxen. ? Vitamin supplements that have vitamin A, vitamin E, or both. ? Hormone therapy that contains estrogen with or without progestin. Eating and drinking   Eat a heart-healthy diet that includes: ? Lots of fresh fruits and vegetables. ? Whole grains. ? Low-fat (lean) protein. ? Low-fat dairy products.  Follow instructions from your doctor about what you cannot eat or drink. Activity  Follow an exercise program that your doctor tells you.  Talk with your doctor about joining a program to help improve the health of your heart (cardiac rehab).  When you feel tired, take a break. Plan breaks if you know you are going to feel tired. Lifestyle   Do not use any products that contain nicotine or tobacco. This includes cigarettes, e-cigarettes, and   chewing tobacco. If you need help quitting, ask your doctor.  If your doctor says you can drink alcohol: ? Limit how much you use to:  0-1 drink a day for women who are not pregnant.  0-2 drinks a day for men. ? Be aware of how much alcohol is in your drink. In the [place] one drink equals:  One 12 oz bottle of beer (355 mL).  One 5 oz glass of wine (148 mL).  One 1 oz glass of hard liquor (44 mL). General instructions  Stay  at a healthy weight. If your doctor tells you to do so, work with him or her to lose weight.  Learn to deal with stress. If you need help, ask your doctor.  Keep your vaccines up to date. Get a flu shot every year.  Talk with your doctor if you feel sad. Take a screening test to see if you are at risk for depression.  Work with your doctor to manage any other health problems that you have. These may include diabetes or high blood pressure.  Keep all follow-up visits as told by your doctor. This is important. Get help right away if:  You have pain in your chest, neck, arm, jaw, or back, and the pain: ? Lasts more than a few minutes. ? Comes back. ? Does not get better after you take medicine under your tongue (sublingual nitroglycerin). ? Keeps getting worse. ? Comes more often.  You have any of these problems for no reason: ? Sweating a lot. ? Heartburn or upset stomach. ? Shortness of breath. ? Trouble breathing. ? Feeling sick to your stomach. ? Throwing up (vomiting). ? Feeling more tired than normal. ? Feeling nervous or worrying more than normal. ? Weakness.  You are suddenly dizzy or light-headed.  You pass out. These symptoms may be an emergency. Do not wait to see if the symptoms will go away. Get medical help right away. Call your local emergency services (911 in the [place] Do not drive yourself to the hospital. Summary  Angina is very bad discomfort or pain in the chest, neck, arm, neck, or back.  Symptoms include chest pain, heartburn or upset stomach for no reason, and shortness of breath.  Women or people with diabetes may have symptoms that are not usual, such as feeling nervous or worried for no reason, weak for no reason, or tired.  Take all medicines only as told by your doctor.  You should eat a heart-healthy diet and follow an exercise program. This information is not intended to replace advice given to you by your health care provider. Make sure you  discuss any questions you have with your health care provider. Document Revised: 07/01/2018 Document Reviewed: 07/01/2018 Elsevier Patient Education  2020 [employer].  Acute Compartment Syndrome  Compartment syndrome is a painful condition that occurs when swelling and pressure build up in a body space (compartment) of the arms or legs. Groups of muscles, nerves, and blood vessels in the arms and legs are separated into various compartments. Each compartment is surrounded by tough layers of tissue (fascia). In compartment syndrome, pressure builds up within the layers of fascia and begins to push on the structures within that compartment. In acute compartment syndrome, the pressure builds up suddenly, often as the result of an injury. If pressure continues to increase, it can block the flow of blood in the smallest blood vessels (capillaries). Then the muscles in the compartment cannot get enough oxygen and nutrients and  will start to die within 4-6 hours. The nerves will begin to die within 12-24 hours. This condition is a medical emergency that must be treated with surgery. What are the causes? This condition may be caused by:  Injury. Some injuries can cause swelling or bleeding in a compartment. This can lead to compartment syndrome. Injuries that may cause this problem include: ? Broken bones, especially the long bones of the arms and legs. ? Crushing injuries. ? Penetrating injuries, such as a knife wound. ? Badly bruised muscles. ? Poisonous bites, such as a snake bite. ? Severe burns.  Blocked blood flow. This could be a result of: ? A cast or bandage that is too tight. ? A surgical procedure. Blood flow sometimes has to be stopped for a while during a surgery, usually with a tourniquet. ? Lying for too long in a position that restricts blood flow. This can happen in people who have nerve damage or if a person is unconscious for a long time. ? Medicines used to build up muscles  (anabolic steroids). ? Medicines that keep the blood from forming clots (blood thinners). What are the signs or symptoms? The most common symptom of this condition is pain. The pain:  May be far more severe than it should be for the injury you have.  May get worse: ? When moving or stretching the affected body part. ? When the area is pushed or squeezed. ? When raising (elevating) affected body part above the level of the heart.  May come with a feeling of tingling or burning.  May not get better when you take pain medicine. Other symptoms include:  A feeling of tightness or fullness in the affected area.  A loss of feeling.  Weakness in the area.  Loss of movement.  Skin becoming pale, tight, and shiny over the painful area.  Warmth and tenderness.  Tensing when the affected area is touched. How is this diagnosed? This condition may be diagnosed based on:  Your physical exam and symptoms.  Measuring the pressure in the affected area (compartment pressure measurement).  Tests to rule out other problems, such as: ? X-rays. ? Blood tests. ? Ultrasound. How is this treated? Treatment for this condition uses a procedure called fasciotomy. In this procedure, incisions are made through the fascia to relieve the pressure in the compartment and to prevent permanent damage. Before the surgery, first-aid treatment is done, which may include:  Treating any injury.  Loosening or removing any cast, bandage, or external wrap that may be causing pain.  Elevating the painful arm or leg to the same level as the heart.  Giving oxygen.  Giving fluids through an IV tube.  Pain medicine. Summary  Compartment syndrome occurs when swelling and pressure build up in a body space (compartment) of the arms or legs.  First aid treatment may include loosening or removing a cast, bandage, or wrap and elevating the painful arm or leg at the level of the heart.  In acute compartment  syndrome, the pressure builds up suddenly, often as the result of an injury.  This condition is a medical emergency that must be treated with a surgical procedure called fasciotomy. This procedure relieves the pressure and prevents permanent damage. This information is not intended to replace advice given to you by your health care provider. Make sure you discuss any questions you have with your health care provider. Document Revised: 10/26/2017 Document Reviewed: 11/02/2016 Elsevier Patient Education  2020 [employer].

## 2020-03-19 NOTE — Progress Notes (Signed)
CARDIAC REHAB PHASE I   PRE:  Rate/Rhythm: 65 SR    BP: sitting 118/61    SaO2: 99 RA  MODE:  Ambulation: 700 ft   POST:  Rate/Rhythm: 76 SR    BP: sitting 104/66     SaO2:    Tolerated ambulation well, no c/o. Discussed stent, restrictions, Brilinta, diet, smoking cessation, exercise, NTG and CRPII with pt and wife. Pt receptive. He is thinking of quitting smoking, sts he has tried numerous times. Will refer to [place] CRPII. He understands importance of Brilinta. [id number]  Harriet Masson CES, ACSM 03/19/2020 8:49 AM

## 2020-03-19 NOTE — Progress Notes (Signed)
Primary Physician/Referring:  Julian Hy, PA-C  Patient ID: Charles Dorsey, male    DOB: 04-09-1961, 59 y.o.   MRN: [id number]  Chief Complaint  Patient presents with  . Chest Pain  . Hospitalization Follow-up   HPI:    Charles Dorsey  is a 59 y.o. [place] male patient with diabetes mellitus, hypertension, hyperlipidemia, tobacco use disorder who was seen by [place] on 03/10/2020, due to symptoms suggestive of unstable angina, underwent relatively urgent angiography on 03/18/2020 and underwent angioplasty and stenting to the proximal LAD.  He now presents for follow-up.  Preprocedurally he had developed right forearm hematoma.  He was evaluated by surgery, fortunately did not develop compartment syndrome.  He now presents for a 4-day office visit, states that he feels well and has started to walk and has noticed has not had any further angina.  He is also wondering whether he could stop his Dexilant that was started for chest burning which he thinks was his angina.  Past Medical History:  Diagnosis Date  . Acid reflux   . Diabetes mellitus without complication ([place])   . Hyperlipidemia   . Hypothyroidism    Past Surgical History:  Procedure Laterality Date  . CORONARY STENT INTERVENTION N/A 03/18/2020   Procedure: CORONARY STENT INTERVENTION;  Surgeon: [name], MD;  Location: [facility];  Service: Cardiovascular;  Laterality: N/A;  . INTRAVASCULAR ULTRASOUND/IVUS N/A 03/18/2020   Procedure: Intravascular Ultrasound/IVUS;  Surgeon: [name], MD;  Location: [facility];  Service: Cardiovascular;  Laterality: N/A;  . LEFT HEART CATH AND CORONARY ANGIOGRAPHY N/A 03/18/2020   Procedure: LEFT HEART CATH AND CORONARY ANGIOGRAPHY;  Surgeon: [name], MD;  Location: [facility];  Service: Cardiovascular;  Laterality: N/A;   Family History  Problem Relation Age of Onset  . Heart disease Father   . Diabetes Father   . Hyperlipidemia  Father     Social History   Tobacco Use  . Smoking status: Current Some Day Smoker  . Smokeless tobacco: Never Used  Substance Use Topics  . Alcohol use: Yes    Comment: occ   Marital Status: Married  ROS  Review of Systems  Cardiovascular: Negative for chest pain, dyspnea on exertion and leg swelling.  Gastrointestinal: Negative for melena.   Objective  Blood pressure 133/77, pulse 73, temperature 98.2 F (36.8 C), temperature source Temporal, resp. rate 17, height 5' 6"  (1.676 m), weight 143 lb (64.9 kg), SpO2 98 %.  Vitals with BMI 03/22/2020 03/19/2020 03/19/2020  Height 5' 6"  - -  Weight 143 lbs - 141 lbs 3 oz  BMI 63.84 - 66.5  Systolic 993 570 177  Diastolic 77 61 72  Pulse 73 64 64     Physical Exam  Neck: Thyromegaly present.  Cardiovascular: Normal rate, regular rhythm, normal heart sounds and intact distal pulses. Exam reveals no gallop.  No murmur heard. No leg edema, no JVD.  Right forearm hematoma noted. Radial pulse is normal. No compartment syndrome  Pulmonary/Chest: Effort normal and breath sounds normal.  Abdominal: Soft. Bowel sounds are normal.   Laboratory examination:   Recent Labs    03/18/20 1202 03/19/20 0355  NA 139 139  K 4.1 3.5  CL 105 105  CO2 26 24  GLUCOSE 138* 124*  BUN 16 12  CREATININE 0.73 0.86  CALCIUM 9.2 9.1  GFRNONAA >60 >60  GFRAA >60 >60   estimated creatinine clearance is 85.5 mL/min (by C-G  formula based on SCr of 0.86 mg/dL).  CMP Latest Ref Rng & Units 03/19/2020 03/18/2020  Glucose 70 - 99 mg/dL 124(H) 138(H)  BUN 6 - 20 mg/dL 12 16  Creatinine 0.61 - 1.24 mg/dL 0.86 0.73  Sodium 135 - 145 mmol/L 139 139  Potassium 3.5 - 5.1 mmol/L 3.5 4.1  Chloride 98 - 111 mmol/L 105 105  CO2 22 - 32 mmol/L 24 26  Calcium 8.9 - 10.3 mg/dL 9.1 9.2   CBC Latest Ref Rng & Units 03/19/2020 03/15/2020  WBC 4.0 - 10.5 K/uL 7.1 5.9  Hemoglobin 13.0 - 17.0 g/dL 15.2 15.5  Hematocrit 39.0 - 52.0 % 44.4 45.9  Platelets 150 - 400  K/uL 262 282   Lipid Panel  No results found for: CHOL, TRIG, HDL, CHOLHDL, VLDL, LDLCALC, LDLDIRECT HEMOGLOBIN A1C No results found for: HGBA1C, MPG TSH No results for input(s): TSH in the last 8760 hours.  External labs:   Glucose 142, BUN/Cr 19/1.0. EGFR 77. Na/K 137/4.3. Rest of the CMP normal H/H 15/47. MCV 94. Platelets 254 HbA1C N/A Chol 199, TG 112, HDL 44, LDL 133 TSH 4.3 normal  Medications and allergies  No Known Allergies   Current Outpatient Medications  Medication Instructions  . aspirin EC 81 mg, Oral, Daily  . atorvastatin (LIPITOR) 40 mg, Oral, Daily  . Dexilant 60 mg, Oral, Daily  . levothyroxine (SYNTHROID) 50 mcg, Oral, Daily before breakfast  . metFORMIN (GLUCOPHAGE) 500 mg, Oral, 2 times daily  . metoprolol tartrate (LOPRESSOR) 25 mg, Oral, 2 times daily  . nitroGLYCERIN (NITROSTAT) 0.4 mg, Sublingual, Every 5 min PRN  . ticagrelor (BRILINTA) 90 mg, Oral, 2 times daily   Radiology:   No results found.  Cardiac Studies:   Coronary Angiogram 03/18/2020: LM: Normal LAD: Prox 90%, mid 80% stenoses Ramus: Small caliber, mid 80% stenosis. Not amenable to revascularization due to small caliber LCx: Medium sized OM1 with mild 30% disease        Small AV grove circumflex with mid 50% stenosis RCA: Mid 40% stenosis  Intrasvascular ultrasound (IVUS) guided successful percutaneous coronary intervention      Cutting balloon angioplasty and PTCA and stent placement      2.0 X 12 mm Resolute Onyx drug-eluting stent mid LAD     2.25 X 26 mm Resolute Onyx drug-eluting stent ostial-prox LAD        Proximal post dilatation using 3.0X15 mm [place] balloon  EKG  EKG 03/20/2019: Normal sinus rhythm at the rate of 61 bpm, normal axis.  No evidence of ischemia, normal EKG.    Assessment     ICD-10-CM   1. Coronary artery disease of native artery of native heart with stable angina pectoris (HCC)  I25.118 Lipoprotein A (LPA)    CBC    CBC    Lipoprotein A (LPA)    2. Essential hypertension  I10   3. Post PTCA  Z98.61   4. Type 1 diabetes mellitus without complication (HCC)  X64.6   5. Pure hypercholesterolemia  E78.00 Lipid Panel With LDL/HDL Ratio    Lipoprotein A (LPA)    Lipoprotein A (LPA)    Lipid Panel With LDL/HDL Ratio    No orders of the defined types were placed in this encounter.   There are no discontinued medications.  Recommendations:   Charles Dorsey  is a 59 y.o. [place] male patient with diabetes mellitus, hypertension, hyperlipidemia, tobacco use disorder who was seen by [place] on 03/10/2020, due to symptoms suggestive of  unstable angina, underwent relatively urgent angiography on 03/18/2020 and underwent angioplasty and stenting to the proximal LAD.  He now presents for follow-up.  Patient had developed right forearm hematoma, but no compartment syndrome today.  I have taken him off of work for a week.  His symptoms of angina has improved significantly.  He has not had any recurrence, he still has sublingual nitroglycerin to be taken on a as needed basis.  He is now smoking about 1 cigarette a day and he is planning to completely quit this.  I will set him up to see [place] back in 3 months, will perform CBC, lipid profile testing and LPA levels prior to his next office visit.  Adrian Prows, MD, [facility] 03/22/2020, 10:39 AM [facility] [place] Office: [phone]

## 2020-03-19 NOTE — Progress Notes (Signed)
Subjective: States arm feels better now that TR band removed.   Objective: Vital signs in last 24 hours: Temp:  [98 F (36.7 C)-99.6 F (37.6 C)] 99.6 F (37.6 C) (04/22 2018) Pulse Rate:  [53-88] 65 (04/22 2129) Resp:  [0-88] 16 (04/22 2018) BP: (94-162)/(59-81) 129/63 (04/22 2129) SpO2:  [0 %-100 %] 97 % (04/22 2018) Weight:  [64 kg-64.9 kg] 64 kg (04/22 2018)  Intake/Output from previous day: No intake/output data recorded. Intake/Output this shift: No intake/output data recorded.  No results for input(s): HGB in the last 72 hours. No results for input(s): WBC, RBC, HCT, PLT in the last 72 hours. Recent Labs    03/18/20 1202  NA 139  K 4.1  CL 105  CO2 26  BUN 16  CREATININE 0.73  GLUCOSE 138*  CALCIUM 9.2   No results for input(s): LABPT, INR in the last 72 hours.  Intact sensation and capillary refill.  Radial pulse palpable.  Full motion digits with minimal pain.    Assessment/Plan: No compartment syndrome.  Call if changes.     Betha Loa 03/19/2020, 12:07 AM

## 2020-03-20 ENCOUNTER — Encounter: Payer: Self-pay | Admitting: Cardiology

## 2020-03-20 NOTE — Discharge Summary (Signed)
Physician Discharge Summary  Patient ID: YANNIS BROCE MRN: [id number] DOB/AGE: August 07, 1961 59 y.o.  Admit date: 03/18/2020 Discharge date: 03/20/2020  Primary Discharge Diagnosis: Unstable angina Coronary artery disease Right forearm hematoma  Secondary Discharge Diagnosis: Hypertension Type 2 Diabetes mellitus Family history of early CAD   Hospital Course:   59 y.o. [place] male with hypertension, hyperlipidemia, type 2 DM, family history of CAD, with CAD with unstable angina  Patient underwent successful Prox and mid LAD intervention. Mild to moderate disease in RCA, LCx, and severe stenosis in small caliber ramus best treated medically. Post-op, patient developed right forearm hematoma with rapid increase in size. Vascular surgery and hand surgery were consultedwho monitored the patient and recommended conservative management. No compartment syndrome or indication for surgery. Patient ambulated without any chest pain.    Discharge Exam: Blood pressure 118/61, pulse 64, temperature 98.1 F (36.7 C), temperature source Oral, resp. rate 16, height 5\' 6"  (1.676 m), weight 64 kg, SpO2 97 %.  Physical Exam  Constitutional: He is oriented to person, place, and time. He appears well-developed and well-nourished. No distress.  HENT:  Head: Normocephalic and atraumatic.  Eyes: Pupils are equal, round, and reactive to light. Conjunctivae are normal.  Neck: No JVD present.  Cardiovascular: Normal rate, regular rhythm, normal heart sounds and intact distal pulses.  No murmur heard. Right forearm diffuse hematoma. With minimal tenderness 2+ radial and ulnar pulses.   Pulmonary/Chest: Effort normal and breath sounds normal. He has no wheezes. He has no rales.  Abdominal: Soft. Bowel sounds are normal. There is no rebound.  Musculoskeletal:        General: No edema.  Lymphadenopathy:    He has no cervical adenopathy.  Neurological: He is alert and oriented to person, place, and time.  No cranial nerve deficit.  Skin: Skin is warm and dry.  Psychiatric: He has a normal mood and affect.  Nursing note and vitals reviewed.   Significant Diagnostic Studies:  Coronary angiography/intervention 03/18/2020: LM: Normal LAD: Prox 90%, mid 80% stenoses Ramus: Small caliber, mid 80% stenosis. Not amenable to revascularization due to small caliber LCx: Medium sized OM1 with mild 30% disease        Small AV grove circumflex with mid 50% stenosis RCA: Mid 40% stenosis  Intrasvascular ultrasound (IVUS) guided successful percutaneous coronary intervention      Cutting balloon angioplasty and PTCA and stent placement      2.0 X 12 mm Resolute Onyx drug-eluting stent mid LAD     2.25 X 26 mm Resolute Onyx drug-eluting stent ostial-prox LAD        Proximal post dilatation using 3.0X15 mm [place] balloon   EKG 03/19/2020: Sinus rhythm. Probable left atrial enlargement.   Labs:   Lab Results  Component Value Date   WBC 7.1 03/19/2020   HGB 15.2 03/19/2020   HCT 44.4 03/19/2020   MCV 91.0 03/19/2020   PLT 262 03/19/2020    Recent Labs  Lab 03/19/20 0355  NA 139  K 3.5  CL 105  CO2 24  BUN 12  CREATININE 0.86  CALCIUM 9.1  GLUCOSE 124*   Radiology: CARDIAC CATHETERIZATION  Result Date: 03/19/2020  The left ventricular ejection fraction is 50-55% by visual estimate.  LM: Normal LAD: Prox 90%, mid 80% stenoses Ramus: Small caliber, mid 80% stenosis. Not amenable to revascularization due to small caliber LCx: Medium sized OM1 with no dsiease        Small AV grove circumflex with mid 50% stenosis  RCA: Mid 40% stenosis Intrasvascular ultrasound (IVUS) guided successful percutaneous coronary intervention     Cutting balloon angioplasty and PTCA and stent placement     2.0 X 12 mm Resolute Onyx drug-eluting stent mid LAD     2.25 X 26 mm Resolute Onyx drug-eluting stent ostial-prox LAD       Proximal post dilatation using 3.0X15 mm [place] balloon  US THYROID  Result Date:  03/02/2020 CLINICAL DATA:  Goiter. EXAM: THYROID ULTRASOUND TECHNIQUE: Ultrasound examination of the thyroid gland and adjacent soft tissues was performed. COMPARISON:  None. FINDINGS: Parenchymal Echotexture: Markedly heterogenous Isthmus: 0.2 cm Right lobe: 6.2 x 2.2 x 3.1 cm Left lobe: 5.7 x 2.0 x 3.5 cm _________________________________________________________ Estimated total number of nodules >/= 1 cm: 3 Number of spongiform nodules >/=  2 cm not described below (TR1): 0 Number of mixed cystic and solid nodules >/= 1.5 cm not described below (TR2): 0 _________________________________________________________ Nodule # 1: Location: Right; Superior Maximum size: 3.2 cm; Other 2 dimensions: 3.0 x 1.7 cm Composition: solid/almost completely solid (2) Echogenicity: isoechoic (1) Shape: not taller-than-wide (0) Margins: smooth (0) Echogenic foci: none (0) ACR TI-RADS total points: 3. ACR TI-RADS risk category: TR3 (3 points). ACR TI-RADS recommendations: **Given size (>/= 2.5 cm) and appearance, fine needle aspiration of this mildly suspicious nodule should be considered based on TI-RADS criteria. _________________________________________________________ Nodule # 2: Location: Right; Inferior Maximum size: 1.3 cm; Other 2 dimensions: 1.1 x 0.9 cm Composition: solid/almost completely solid (2) Echogenicity: hyperechoic (1) Shape: not taller-than-wide (0) Margins: ill-defined (0) Echogenic foci: none (0) ACR TI-RADS total points: 3. ACR TI-RADS risk category: TR3 (3 points). ACR TI-RADS recommendations: Given size (<1.4 cm) and appearance, this nodule does NOT meet TI-RADS criteria for biopsy or dedicated follow-up. _________________________________________________________ Nodule # 3: Location: Left; Superior Maximum size: 0.7 cm; Other 2 dimensions: 0.7 x 0.6 cm Composition: solid/almost completely solid (2) Echogenicity: hypoechoic (2) Shape: not taller-than-wide (0) Margins: smooth (0) Echogenic foci: none (0) ACR  TI-RADS total points: 4. ACR TI-RADS risk category: TR4 (4-6 points). ACR TI-RADS recommendations: Given size (<0.9 cm) and appearance, this nodule does NOT meet TI-RADS criteria for biopsy or dedicated follow-up. _________________________________________________________ Nodule # 4: Location: Left; Mid Maximum size: 3.2 cm; Other 2 dimensions: 2.9 x 1.6 cm Composition: solid/almost completely solid (2) Echogenicity: isoechoic (1) Shape: not taller-than-wide (0) Margins: ill-defined (0) Echogenic foci: none (0) ACR TI-RADS total points: 3. ACR TI-RADS risk category: TR3 (3 points). ACR TI-RADS recommendations: **Given size (>/= 2.5 cm) and appearance, fine needle aspiration of this mildly suspicious nodule should be considered based on TI-RADS criteria. _________________________________________________________ Area of subtle nodularity measured in the inferior left lobe of 0.8 cm appears likely to be a pseudo nodule. This does not meet criteria for biopsy or follow-up if categorized as a true nodule. No abnormal lymph nodes identified. IMPRESSION: 3.2 cm right superior (nodule 1) and 3.2 cm left mid (nodule 4) thyroid nodules meet criteria for fine-needle aspiration. Additional 1.3 cm right inferior and 0.7 cm left superior nodules do not meet criteria for biopsy or dedicated follow-up. The above is in keeping with the ACR TI-RADS recommendations - J Am Coll Radiol 2017;14:587-595. Electronically Signed   By: Irish Lack M.D.   On: 03/02/2020 13:34      FOLLOW UP PLANS AND APPOINTMENTS Discharge Instructions    Amb Referral to Cardiac Rehabilitation   Complete by: As directed    Diagnosis:  Coronary Stents PTCA     After initial  evaluation and assessments completed: Virtual Based Care may be provided alone or in conjunction with Phase 2 Cardiac Rehab based on patient barriers.: Yes   Diet - low sodium heart healthy   Complete by: As directed    Increase activity slowly   Complete by: As directed       Allergies as of 03/19/2020   No Known Allergies     Medication List    STOP taking these medications   isosorbide mononitrate 30 MG 24 hr tablet Commonly known as: IMDUR   pravastatin 10 MG tablet Commonly known as: PRAVACHOL     TAKE these medications   aspirin EC 81 MG tablet Take 1 tablet (81 mg total) by mouth daily.   atorvastatin 40 MG tablet Commonly known as: Lipitor Take 1 tablet (40 mg total) by mouth daily.   Dexilant 60 MG capsule Generic drug: dexlansoprazole Take 60 mg by mouth daily.   levothyroxine 50 MCG tablet Commonly known as: SYNTHROID Take 50 mcg by mouth daily before breakfast.   metFORMIN 500 MG tablet Commonly known as: GLUCOPHAGE Take 500 mg by mouth 2 (two) times daily.   metoprolol tartrate 25 MG tablet Commonly known as: LOPRESSOR Take 1 tablet (25 mg total) by mouth 2 (two) times daily.   nitroGLYCERIN 0.4 MG SL tablet Commonly known as: NITROSTAT Place 1 tablet (0.4 mg total) under the tongue every 5 (five) minutes as needed for chest pain.   ticagrelor 90 MG Tabs tablet Commonly known as: BRILINTA Take 1 tablet (90 mg total) by mouth 2 (two) times daily.      Follow-up Information    Elder Negus, MD Follow up on 03/22/2020.   Specialties: Cardiology, Radiology Why: 10:00 AM Contact information: [address] [place] [address] [phone]            Truett Mainland MD, [facility] Pager: [phone] Office: [phone] If no answer: [phone]

## 2020-03-22 ENCOUNTER — Encounter: Payer: Self-pay | Admitting: Cardiology

## 2020-03-22 ENCOUNTER — Ambulatory Visit: Payer: BC Managed Care – PPO | Admitting: Cardiology

## 2020-03-22 ENCOUNTER — Other Ambulatory Visit: Payer: Self-pay

## 2020-03-22 VITALS — BP 133/77 | HR 73 | Temp 98.2°F | Resp 17 | Ht 66.0 in | Wt 143.0 lb

## 2020-03-22 DIAGNOSIS — Z9861 Coronary angioplasty status: Secondary | ICD-10-CM

## 2020-03-22 DIAGNOSIS — I25118 Atherosclerotic heart disease of native coronary artery with other forms of angina pectoris: Secondary | ICD-10-CM

## 2020-03-22 DIAGNOSIS — E7800 Pure hypercholesterolemia, unspecified: Secondary | ICD-10-CM

## 2020-03-22 DIAGNOSIS — I10 Essential (primary) hypertension: Secondary | ICD-10-CM

## 2020-03-22 DIAGNOSIS — E109 Type 1 diabetes mellitus without complications: Secondary | ICD-10-CM

## 2020-03-22 NOTE — Patient Instructions (Signed)
Blood work 1 week prior to next OV with Dr. Rosemary Holms

## 2020-03-23 ENCOUNTER — Other Ambulatory Visit: Payer: BC Managed Care – PPO

## 2020-03-24 ENCOUNTER — Telehealth (HOSPITAL_COMMUNITY): Payer: Self-pay

## 2020-03-24 NOTE — Telephone Encounter (Signed)
Called patient to see if he is interested in the Cardiac Rehab Program. Patient stated he is not interested at this time.  Closed referral 

## 2020-03-24 NOTE — Telephone Encounter (Signed)
Pt insurance is active and benefits verified through [employer]. Co-pay $0.00, DED $1,500.00/$483.96 met, out of pocket $5,900.00/$887.73 met, co-insurance 30%. No pre-authorization required. [employer]port, 03/24/20 @ 12:10PM, NMM#[id number]  Will contact patient to see if he is interested in the Cardiac Rehab Program. If interested, patient will need to complete follow up appt. Once completed, patient will be contacted for scheduling upon review by the RN Navigator.

## 2020-04-01 ENCOUNTER — Other Ambulatory Visit (HOSPITAL_COMMUNITY)
Admission: RE | Admit: 2020-04-01 | Discharge: 2020-04-01 | Disposition: A | Discharge status: Home / Self Care | Payer: BC Managed Care – PPO | Source: Ambulatory Visit | Attending: Internal Medicine | Admitting: Internal Medicine

## 2020-04-01 ENCOUNTER — Ambulatory Visit
Admission: RE | Admit: 2020-04-01 | Discharge: 2020-04-01 | Disposition: A | Discharge status: Home / Self Care | Payer: BC Managed Care – PPO | Source: Ambulatory Visit | Attending: Internal Medicine | Admitting: Internal Medicine

## 2020-04-01 DIAGNOSIS — E042 Nontoxic multinodular goiter: Secondary | ICD-10-CM | POA: Diagnosis present

## 2020-04-02 LAB — CYTOLOGY - NON PAP

## 2020-06-14 ENCOUNTER — Ambulatory Visit: Payer: BC Managed Care – PPO | Admitting: Cardiology

## 2020-06-21 ENCOUNTER — Ambulatory Visit: Payer: BC Managed Care – PPO | Admitting: Cardiology

## 2020-09-11 DIAGNOSIS — E109 Type 1 diabetes mellitus without complications: Secondary | ICD-10-CM | POA: Insufficient documentation

## 2020-09-11 DIAGNOSIS — I10 Essential (primary) hypertension: Secondary | ICD-10-CM | POA: Insufficient documentation

## 2020-09-11 NOTE — Progress Notes (Signed)
Patient referred by Charles Hy, PA* for chest pain  Subjective:   Charles Dorsey, male    DOB: Feb 21, 1961, 59 y.o.   MRN: [id number]   Chief Complaint  Patient presents with   Coronary Artery Disease   Follow-up    3 month      HPI  59 y.o. [place] male with hypertension, hyperlipidemia, type 2 DM, family h/o early CAD, CAD s/p LAD PCI 02/2020  Patient underwent successful LAD PCI in 02/2020 due to unstable angina symptoms. He has post cath right forearm hematoma, that resolved with time and did not need any surgical intervention.   Patient has been out of his Brilinta for last couple months.  He did not get any refill filled. He is taking metoprolol, atorvastatin, aspirin.  For last few days, he has noticed pain on left and right side of his chest, lasting for 1-2-minute, unrelated to exertion.  This is different from his episode of chest pain prior to his coronary stenting.  He is able to climb up stairs and perform all work-related physical activity without any difficulty.  However, given his prior cardiac history, he is nervous. Blood pressure is elevated today.  Initial consultation HPI 02/2020: 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  He has noticed left-sided chest pain only with physical activity such as climbing up stairs or lifting any heavy objects.  Episodes last for 1 to 2 minutes, and resolve with rest.  While he has had these episodes about once a month for last year or so, they have been frequent, occurring on a daily basis for last week or 2.  Patient is on treatment for diabetes with Metformin, with improvement in his A1c, as reported by him.  He has been off his lipid-lowering pravastatin medication for 2 weeks.  He is not on any antihypertensive therapy at baseline, and states that his blood pressure is usually normal.  He smokes 3 cigarettes a day for last 35 years.  His father had CABG at age  45.   Current Outpatient Medications on File Prior to Visit  Medication Sig Dispense Refill   aspirin EC 81 MG tablet Take 1 tablet (81 mg total) by mouth daily. 30 tablet 2   atorvastatin (LIPITOR) 40 MG tablet Take 1 tablet (40 mg total) by mouth daily. 30 tablet 2   No current facility-administered medications on file prior to visit.    Cardiovascular and other pertinent studies:  EKG 09/17/2020: Sinus rhythm 79 bpm Normal EKG  Coronary intervention 03/18/2020: LM: Normal LAD: Prox 90%, mid 80% stenoses Ramus: Small caliber, mid 80% stenosis. Not amenable to revascularization due to small caliber LCx: Medium sized OM1 with mild 30% disease        Small AV grove circumflex with mid 50% stenosis RCA: Mid 40% stenosis  Intrasvascular ultrasound (IVUS) guided successful percutaneous coronary intervention      Cutting balloon angioplasty and PTCA and stent placement      2.0 X 12 mm Resolute Onyx drug-eluting stent mid LAD     2.25 X 26 mm Resolute Onyx drug-eluting stent ostial-prox LAD        Proximal post dilatation using 3.0X15 mm [place] balloon    EKG 03/10/2020: Sinus rhythm 72 bpm. Nonspecific T wave changes anteroseptal leads.   Recent labs: 02/01/2020: Glucose 142, BUN/Cr 19/1.0. EGFR 77. Na/K 137/4.3. Rest of the CMP normal H/H 15/47. MCV 94. Platelets 254 HbA1C N/A  Chol 199, TG 112, HDL 44, LDL 133 TSH 4.3 normal    Review of Systems  Cardiovascular: Positive for chest pain. Negative for dyspnea on exertion, leg swelling, palpitations and syncope.         Vitals:   09/17/20 0926  BP: (!) 147/84  Pulse: 81  Resp: 16  SpO2: 97%     Body mass index is 23.57 kg/m. Filed Weights   09/17/20 0926  Weight: 146 lb (66.2 kg)     Objective:   Physical Exam  Constitutional: He appears well-developed and well-nourished.  Neck: No JVD present.  Cardiovascular: Normal rate, regular rhythm, normal heart sounds and intact distal pulses.  No murmur  heard. Pulmonary/Chest: Effort normal and breath sounds normal. He has no wheezes. He has no rales.  Musculoskeletal:        General: No edema.  Nursing note and vitals reviewed.        Assessment & Recommendations:   59 y.o. [place] male with hypertension, hyperlipidemia, type 2 DM, family h/o early CAD, CAD s/p LAD PCI 02/2020  CAD: LAD PCI in 4/202. Small caliber vessel with overlapping stents Patient has inadvertently not been compliant with dual antiplatelet therapy, as recommended.  Since he has been off Brilinta for last couple months, I will go to discontinue it.  I will start him on Plavix 75 mg daily, in addition to aspirin 81 mg daily. Continue Lipitor 40 mg daily.  Check lipid panel. Change metoprolol tartrate 25 mg twice daily to metoprolol succinate 50 mg daily.  Added amlodipine 5 mg daily. Refill sublingual nitroglycerin for as needed use. We will go ahead and obtain exercise nuclear stress testing.  Hypertension: Elevated.  Added amlodipine.  DM: Continue follow-up with PCP.  Follow-up after stress testing and lipid panel checked.   Charles Mormon, MD [facility] Pager: [phone] Office: [phone]

## 2020-09-17 ENCOUNTER — Ambulatory Visit: Payer: BC Managed Care – PPO | Admitting: Cardiology

## 2020-09-17 ENCOUNTER — Encounter: Payer: Self-pay | Admitting: Cardiology

## 2020-09-17 ENCOUNTER — Other Ambulatory Visit: Payer: Self-pay

## 2020-09-17 VITALS — BP 147/84 | HR 81 | Resp 16 | Ht 66.0 in | Wt 146.0 lb

## 2020-09-17 DIAGNOSIS — E109 Type 1 diabetes mellitus without complications: Secondary | ICD-10-CM

## 2020-09-17 DIAGNOSIS — I10 Essential (primary) hypertension: Secondary | ICD-10-CM

## 2020-09-17 DIAGNOSIS — R079 Chest pain, unspecified: Secondary | ICD-10-CM

## 2020-09-17 DIAGNOSIS — I25118 Atherosclerotic heart disease of native coronary artery with other forms of angina pectoris: Secondary | ICD-10-CM

## 2020-09-17 MED ORDER — METOPROLOL SUCCINATE ER 50 MG PO TB24: 50.0000 mg | ORAL_TABLET | Freq: Every day | ORAL | 3 refills | Status: DC

## 2020-09-17 MED ORDER — AMLODIPINE BESYLATE 5 MG PO TABS: 5.0000 mg | ORAL_TABLET | Freq: Every day | ORAL | 3 refills | Status: DC

## 2020-09-17 MED ORDER — NITROGLYCERIN 0.4 MG SL SUBL: 0.4000 mg | SUBLINGUAL_TABLET | SUBLINGUAL | 2 refills | Status: DC | PRN

## 2020-09-17 MED ORDER — CLOPIDOGREL BISULFATE 75 MG PO TABS: 75.0000 mg | ORAL_TABLET | Freq: Every day | ORAL | 3 refills | Status: DC

## 2020-10-01 ENCOUNTER — Other Ambulatory Visit (HOSPITAL_COMMUNITY): Payer: Self-pay

## 2020-10-01 ENCOUNTER — Other Ambulatory Visit: Payer: Self-pay | Admitting: Cardiology

## 2020-10-01 DIAGNOSIS — R079 Chest pain, unspecified: Secondary | ICD-10-CM

## 2020-10-04 ENCOUNTER — Other Ambulatory Visit: Payer: BC Managed Care – PPO

## 2020-10-12 ENCOUNTER — Other Ambulatory Visit: Payer: Self-pay | Admitting: Cardiology

## 2020-10-12 DIAGNOSIS — R079 Chest pain, unspecified: Secondary | ICD-10-CM

## 2020-10-24 ENCOUNTER — Other Ambulatory Visit: Payer: Self-pay | Admitting: Cardiology

## 2020-10-24 DIAGNOSIS — R079 Chest pain, unspecified: Secondary | ICD-10-CM

## 2020-11-05 ENCOUNTER — Other Ambulatory Visit: Payer: Self-pay | Admitting: Cardiology

## 2020-11-05 DIAGNOSIS — R079 Chest pain, unspecified: Secondary | ICD-10-CM

## 2020-12-21 ENCOUNTER — Emergency Department (HOSPITAL_COMMUNITY)
Admission: EM | Admit: 2020-12-21 | Discharge: 2020-12-21 | Disposition: A | Discharge status: Home / Self Care | Payer: BC Managed Care – PPO | Attending: Emergency Medicine | Admitting: Emergency Medicine

## 2020-12-21 ENCOUNTER — Other Ambulatory Visit: Payer: Self-pay

## 2020-12-21 ENCOUNTER — Emergency Department (HOSPITAL_COMMUNITY): Payer: BC Managed Care – PPO

## 2020-12-21 DIAGNOSIS — R10814 Left lower quadrant abdominal tenderness: Secondary | ICD-10-CM | POA: Diagnosis not present

## 2020-12-21 DIAGNOSIS — F17200 Nicotine dependence, unspecified, uncomplicated: Secondary | ICD-10-CM | POA: Insufficient documentation

## 2020-12-21 DIAGNOSIS — E039 Hypothyroidism, unspecified: Secondary | ICD-10-CM | POA: Diagnosis not present

## 2020-12-21 DIAGNOSIS — I10 Essential (primary) hypertension: Secondary | ICD-10-CM | POA: Diagnosis not present

## 2020-12-21 DIAGNOSIS — Z7982 Long term (current) use of aspirin: Secondary | ICD-10-CM | POA: Diagnosis not present

## 2020-12-21 DIAGNOSIS — R109 Unspecified abdominal pain: Secondary | ICD-10-CM

## 2020-12-21 DIAGNOSIS — E119 Type 2 diabetes mellitus without complications: Secondary | ICD-10-CM | POA: Insufficient documentation

## 2020-12-21 DIAGNOSIS — Z79899 Other long term (current) drug therapy: Secondary | ICD-10-CM | POA: Diagnosis not present

## 2020-12-21 DIAGNOSIS — R748 Abnormal levels of other serum enzymes: Secondary | ICD-10-CM | POA: Diagnosis not present

## 2020-12-21 DIAGNOSIS — Z7984 Long term (current) use of oral hypoglycemic drugs: Secondary | ICD-10-CM | POA: Diagnosis not present

## 2020-12-21 LAB — LIPASE, BLOOD: Lipase: 153 U/L — ABNORMAL HIGH (ref 11–51)

## 2020-12-21 LAB — COMPREHENSIVE METABOLIC PANEL WITH GFR
ALT: 42 U/L (ref 0–44)
AST: 30 U/L (ref 15–41)
Albumin: 4.1 g/dL (ref 3.5–5.0)
Alkaline Phosphatase: 143 U/L — ABNORMAL HIGH (ref 38–126)
Anion gap: 13 (ref 5–15)
BUN: 15 mg/dL (ref 6–20)
CO2: 23 mmol/L (ref 22–32)
Calcium: 9.2 mg/dL (ref 8.9–10.3)
Chloride: 97 mmol/L — ABNORMAL LOW (ref 98–111)
Creatinine, Ser: 0.81 mg/dL (ref 0.61–1.24)
GFR, Estimated: >60 mL/min
Glucose, Bld: 225 mg/dL — ABNORMAL HIGH (ref 70–99)
Potassium: 3.9 mmol/L (ref 3.5–5.1)
Sodium: 133 mmol/L — ABNORMAL LOW (ref 135–145)
Total Bilirubin: 0.8 mg/dL (ref 0.3–1.2)
Total Protein: 7.6 g/dL (ref 6.5–8.1)

## 2020-12-21 LAB — CBC
HCT: 48.2 % (ref 39.0–52.0)
Hemoglobin: 16.6 g/dL (ref 13.0–17.0)
MCH: 31.7 pg (ref 26.0–34.0)
MCHC: 34.4 g/dL (ref 30.0–36.0)
MCV: 92 fL (ref 80.0–100.0)
Platelets: 215 10*3/uL (ref 150–400)
RBC: 5.24 MIL/uL (ref 4.22–5.81)
RDW: 12.3 % (ref 11.5–15.5)
WBC: 6.8 10*3/uL (ref 4.0–10.5)
nRBC: 0 % (ref 0.0–0.2)

## 2020-12-21 LAB — URINALYSIS, ROUTINE W REFLEX MICROSCOPIC
Bacteria, UA: NONE SEEN
Bilirubin Urine: NEGATIVE
Glucose, UA: >=500 mg/dL — AB
Hgb urine dipstick: NEGATIVE
Ketones, ur: NEGATIVE mg/dL
Leukocytes,Ua: NEGATIVE
Nitrite: NEGATIVE
Protein, ur: 100 mg/dL — AB
Specific Gravity, Urine: 1.028 (ref 1.005–1.030)
pH: 5 (ref 5.0–8.0)

## 2020-12-21 MED ORDER — HYDROCODONE-ACETAMINOPHEN 5-325 MG PO TABS
1.0000 | ORAL_TABLET | Freq: Once | ORAL | Status: AC
  Administered 2020-12-21: 1 via ORAL
  Filled 2020-12-21: qty 1

## 2020-12-21 MED ORDER — HYDROCODONE-ACETAMINOPHEN 5-325 MG PO TABS: 1.0000 | ORAL_TABLET | Freq: Four times a day (QID) | ORAL | 0 refills | Status: DC | PRN

## 2020-12-21 MED ORDER — PANTOPRAZOLE SODIUM 20 MG PO TBEC
20.0000 mg | DELAYED_RELEASE_TABLET | Freq: Once | ORAL | Status: AC
  Administered 2020-12-21: 20 mg via ORAL
  Filled 2020-12-21: qty 1

## 2020-12-21 MED ORDER — IOHEXOL 300 MG/ML  SOLN
100.0000 mL | Freq: Once | INTRAMUSCULAR | Status: AC | PRN
  Administered 2020-12-21: 100 mL via INTRAVENOUS

## 2020-12-21 MED ORDER — PANTOPRAZOLE SODIUM 20 MG PO TBEC
20.0000 mg | DELAYED_RELEASE_TABLET | Freq: Every day | ORAL | 0 refills | Status: DC
Start: 2020-12-21 — End: 2023-10-24

## 2020-12-21 NOTE — Discharge Instructions (Addendum)
??????   lipase ??????????????????????????????????? ???????????????????????????????? ??????????????????????????????????????????????? ?????????????????????????????? ?????????????? (?????????????????????????????) ?????? 24 ???????????? ???????????? ??????????????????????????????????????? ??????????? Protonix ????????????? ??????????? Vicodin ???????????????????????????????? ???????????????????????????? ??????????????????????????????????????????? ????????????? lipase ???????????????????????????????????????????? ?????????????????????????????? ????????????????????????????? ??????????????????????  ???????????????????? ????????????????????????? GI ???????? ??????????? Charles Dorsey ??????????????????????????????????????????????? kamrit lipase  robsa anak meankar kaenlaeng bantich now [place]  del ach srab nung chomngu rleak lompeng dambaung .  saum an sievphow nenam ampi ptrmean banthem samreab rueng nih .  nih kuchea kar rleak nei lompeng robsa anak .  saum phoek tuk saat ( komrong kar nhea del phcheab mk cheamuoy ) rypel 24  maong banteab .  banteabpi noh  saum nenam aharosamron del min mean cheate khlanh .  saum lebathnam Protonix  chea riengrealthngai  kadauchchea brae Vicodin  samreab kar chhuchok chab tam tamrouvkar .  vea nung mean sarsaamkhan khlang nasa  del anak tamdan cheamuoy kroupety thetam bathm robsa anak  daembi pinity lipase  robsa anak laengvinh knongorypel bonman thngaibanteab  daembi brakd tha vea min bant kaenlaeng .  chomngu rleak lompeng ach thngonthngor nasa  brasenbae minban pyeabeal .   dauchdel ban pipheaksaea haey  saum tamdan cheamuoy vechchobandet GI  robsa anak vechchobandet Charles Dorsey  samreab karvayotamlei banthem ampi kar chhukrapeah robsa anak .     Your lipase was a little bit elevated today, which may be consistent with early pancreatitis.  Please read the handout on more information for this.  This is an inflammation of your pancreas.  Please drink clear fluids (eating plan attached)  for the next 24 hours.  After that, introduce bland foods that are not fatty.  Please take the Protonix daily, as well as use the Vicodin for breakthrough pain as needed.  It will be very important that you follow-up with your primary care doctor to have your lipase rechecked in the next several days to make sure it does not continue increasing.  Pancreatitis can be very serious if not treated.  As discussed, please also follow-up with your GI doctor Dr. Al Dorsey for further evaluation of your stomach pain

## 2020-12-21 NOTE — ED Notes (Signed)
Pt d/c by MD and is provided w/ d/c instructions and follow up care, Pt is out of the ED ambulatory by self  

## 2020-12-21 NOTE — ED Provider Notes (Signed)
[facility] EMERGENCY DEPARTMENT Provider Note   CSN: [id number] Arrival date & time: 12/21/20  4098     History Chief Complaint  Patient presents with  . Abdominal Pain    Charles Dorsey is a 60 y.o. male.  HPI 60 year old male with a history of DM type II, hyperlipidemia, hypothyroidism presents to the ER with complaints of intermittent left-sided abdominal pain x3 weeks.  Patient did not require a Nurse, learning disability.  He states the 1st couple episode started around Christmas time.  States the pain is relieved with Pepto-Bismol, but will return every 4-5 hours.  He has been taking this regularly for his symptoms.  He also states that he has lost 4-5 pounds since Christmas time.  He has some nonbloody non-pussy diarrhea that has been intermittent as well.  He has lost about 4-5 pounds since December.  He is concerned that this may be colon cancer.  He does not have a family history of colon cancer.  He denies any nausea or vomiting.  No fever or chills.  No flank tenderness.  He does endorse some urinary frequency.  He does have diabetes, which he takes Metformin for.  Reports having had a procedure to go down his nose with a stomach doctor, though does not recall where or with whom this was.  Per chart review, this was done in February 2021 with Dr. Al Corpus with gastroenterology.    Past Medical History:  Diagnosis Date  . Acid reflux   . Diabetes mellitus without complication (HCC)   . Hyperlipidemia   . Hypothyroidism     Patient Active Problem List   Diagnosis Date Noted  . Type 1 diabetes mellitus without complication (HCC) 09/11/2020  . Essential hypertension 09/11/2020  . Unstable angina (HCC) 03/18/2020  . Family history of early CAD 03/18/2020  . Hyperlipidemia 03/18/2020  . Post PTCA 03/18/2020  . Exertional chest pain 03/10/2020    Past Surgical History:  Procedure Laterality Date  . CORONARY STENT INTERVENTION N/A 03/18/2020   Procedure: CORONARY STENT  INTERVENTION;  Surgeon: Elder Negus, MD;  Location: [facility] INVASIVE CV LAB;  Service: Cardiovascular;  Laterality: N/A;  . INTRAVASCULAR ULTRASOUND/IVUS N/A 03/18/2020   Procedure: Intravascular Ultrasound/IVUS;  Surgeon: Elder Negus, MD;  Location: [facility] INVASIVE CV LAB;  Service: Cardiovascular;  Laterality: N/A;  . LEFT HEART CATH AND CORONARY ANGIOGRAPHY N/A 03/18/2020   Procedure: LEFT HEART CATH AND CORONARY ANGIOGRAPHY;  Surgeon: Elder Negus, MD;  Location: [facility] INVASIVE CV LAB;  Service: Cardiovascular;  Laterality: N/A;       Family History  Problem Relation Age of Onset  . Heart disease Father   . Diabetes Father   . Hyperlipidemia Father     Social History   Tobacco Use  . Smoking status: Current Some Day Smoker  . Smokeless tobacco: Never Used  Vaping Use  . Vaping Use: Never used  Substance Use Topics  . Alcohol use: Yes    Comment: occ  . Drug use: Never    Home Medications Prior to Admission medications   Medication Sig Start Date End Date Taking? Authorizing Provider  HYDROcodone-acetaminophen (NORCO/VICODIN) 5-325 MG tablet Take 1 tablet by mouth every 6 (six) hours as needed for up to 6 doses for moderate pain. 12/21/20  Yes Mare Ferrari, PA-C  pantoprazole (PROTONIX) 20 MG tablet Take 1 tablet (20 mg total) by mouth daily. 12/21/20  Yes Trudee Grip A, PA-C  amLODipine (NORVASC) 5 MG tablet Take 1 tablet (  5 mg total) by mouth daily. 09/17/20 12/16/20  Patwardhan, Anabel Bene, MD  aspirin EC 81 MG tablet Take 1 tablet (81 mg total) by mouth daily. 03/19/20   Patwardhan, Anabel Bene, MD  atorvastatin (LIPITOR) 40 MG tablet Take 1 tablet (40 mg total) by mouth daily. 03/19/20 03/19/21  Patwardhan, Anabel Bene, MD  clopidogrel (PLAVIX) 75 MG tablet Take 1 tablet (75 mg total) by mouth daily. 09/17/20   Patwardhan, Anabel Bene, MD  metoprolol succinate (TOPROL-XL) 50 MG 24 hr tablet Take 1 tablet (50 mg total) by mouth daily. Take with or immediately following a  meal. 09/17/20 12/16/20  Patwardhan, Manish J, MD  nitroGLYCERIN (NITROSTAT) 0.4 MG SL tablet PLACE 1 TABLET UNDER TONGUE EVERY 5 MINUTES AS NEEDED CHEST PAIN 11/05/20   Patwardhan, Anabel Bene, MD    Allergies    Patient has no known allergies.  Review of Systems   Review of Systems  Constitutional: Negative for chills and fever.  HENT: Negative for ear pain and sore throat.   Eyes: Negative for pain and visual disturbance.  Respiratory: Negative for cough and shortness of breath.   Cardiovascular: Negative for chest pain and palpitations.  Gastrointestinal: Positive for abdominal pain and diarrhea. Negative for nausea, rectal pain and vomiting.  Genitourinary: Negative for dysuria and hematuria.  Musculoskeletal: Negative for arthralgias and back pain.  Skin: Negative for color change and rash.  Neurological: Negative for seizures and syncope.  All other systems reviewed and are negative.   Physical Exam Updated Vital Signs BP (!) 144/85 (BP Location: Right Arm) Comment: Simultaneous filing. User may not have seen previous data. Comment (BP Location): Simultaneous filing. User may not have seen previous data.  Pulse 84 Comment: Simultaneous filing. User may not have seen previous data.  Temp 98.4 F (36.9 C) (Oral) Comment: Simultaneous filing. User may not have seen previous data. Comment (Src): Simultaneous filing. User may not have seen previous data.  Resp 19 Comment: Simultaneous filing. User may not have seen previous data.  Wt 69.4 kg   SpO2 96% Comment: Simultaneous filing. User may not have seen previous data.  BMI 24.69 kg/m   Physical Exam Vitals and nursing note reviewed.  Constitutional:      General: He is not in acute distress.    Appearance: He is well-developed and well-nourished. He is not ill-appearing, toxic-appearing or diaphoretic.  HENT:     Head: Normocephalic and atraumatic.  Eyes:     Conjunctiva/sclera: Conjunctivae normal.  Cardiovascular:      Rate and Rhythm: Normal rate and regular rhythm.     Heart sounds: No murmur heard.   Pulmonary:     Effort: Pulmonary effort is normal. No respiratory distress.     Breath sounds: Normal breath sounds.  Abdominal:     General: Abdomen is flat. Bowel sounds are normal.     Palpations: Abdomen is soft.     Tenderness: There is abdominal tenderness in the left upper quadrant and left lower quadrant. There is no right CVA tenderness or left CVA tenderness.     Hernia: No hernia is present.     Comments: Generalized left-sided abdominal tenderness.  No flank tenderness.  Musculoskeletal:        General: No edema.     Cervical back: Neck supple.  Skin:    General: Skin is warm and dry.  Neurological:     General: No focal deficit present.     Mental Status: He is alert.  Psychiatric:  Mood and Affect: Mood and affect and mood normal.        Behavior: Behavior normal.     ED Results / Procedures / Treatments   Labs (all labs ordered are listed, but only abnormal results are displayed) Labs Reviewed  LIPASE, BLOOD - Abnormal; Notable for the following components:      Result Value   Lipase 153 (*)    All other components within normal limits  COMPREHENSIVE METABOLIC PANEL - Abnormal; Notable for the following components:   Sodium 133 (*)    Chloride 97 (*)    Glucose, Bld 225 (*)    Alkaline Phosphatase 143 (*)    All other components within normal limits  URINALYSIS, ROUTINE W REFLEX MICROSCOPIC - Abnormal; Notable for the following components:   Glucose, UA >=500 (*)    Protein, ur 100 (*)    All other components within normal limits  CBC    EKG None  Radiology CT ABDOMEN PELVIS W CONTRAST  Result Date: 12/21/2020 CLINICAL DATA:  Left upper quadrant abdominal pain. EXAM: CT ABDOMEN AND PELVIS WITH CONTRAST TECHNIQUE: Multidetector CT imaging of the abdomen and pelvis was performed using the standard protocol following bolus administration of intravenous  contrast. CONTRAST:  100 cc of Omnipaque 300 COMPARISON:  None. FINDINGS: Lower chest: 3 mm left lower lobe pulmonary nodule on 08/04. Normal heart size without pericardial or pleural effusion. Suspicion of mild distal esophageal wall thickening on 02/03. Hepatobiliary: Mild hepatic steatosis. Hepatomegaly at 18.5 cm craniocaudal. No focal liver lesion. Normal gallbladder, without biliary ductal dilatation. Pancreas: Normal, without mass or ductal dilatation. Spleen: Normal in size, without focal abnormality. Adrenals/Urinary Tract: Normal adrenal glands. Upper pole right renal 1.0 cm lesion is borderline too small to characterize, but likely a cyst or minimally complex cyst. Normal left kidney. No hydronephrosis. Normal urinary bladder. Stomach/Bowel: The proximal stomach is underdistended. Concurrent wall and fold thickening, including along the lesser curvature on images 19-22 of series 3. There is also gastric antral wall thickening on 25/3. Scattered colonic diverticula. Normal terminal ileum and appendix. Normal small bowel. Vascular/Lymphatic: Aortic atherosclerosis. No abdominopelvic adenopathy. Reproductive: Normal prostate. Other: No significant free fluid.  No free intraperitoneal air. 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 IMPRESSION: 1. Although the stomach is underdistended, apparent wall and fold thickening is felt to be greater than expected secondary to underdistention. Favor gastritis or other gastric pathology. Consider endoscopy. 2. No other explanation for left upper quadrant pain. 3. Distal esophageal wall thickening, suggesting esophagitis. 4. Hepatic steatosis and hepatomegaly. 5.  Aortic Atherosclerosis (ICD10-I70.0). 6. 3 mm left lower lobe pulmonary nodule. No follow-up needed if patient is low-risk. Non-contrast chest CT can be considered in 12 months if patient is high-risk. This recommendation follows the consensus statement: Guidelines for  Management of Incidental Pulmonary Nodules Detected on CT Images: From the Fleischner Society 2017; Radiology 2017; 284:228-243. Electronically Signed   By: Jeronimo GreavesKyle  Talbot M.D.   On: 12/21/2020 08:53    Procedures Procedures   Medications Ordered in ED Medications  pantoprazole (PROTONIX) EC tablet 20 mg (has no administration in time range)  HYDROcodone-acetaminophen (NORCO/VICODIN) 5-325 MG per tablet 1 tablet (has no administration in time range)  iohexol (OMNIPAQUE) 300 MG/ML solution 100 mL (100 mLs Intravenous Contrast Given 12/21/20 0842)    ED Course  I have reviewed the triage vital signs and the nursing notes.  Pertinent labs & imaging results that were available during my care of the patient were reviewed by  me and considered in my medical decision making (see chart for details).    MDM Rules/Calculators/A&P                         60 year old male with complaints of abdominal pain that has been intermittent for the last 3 weeks.  Overall, vitals are reassuring, not hypotensive, tachycardic, hypoxic, afebrile.  Physical exam with some generalized left side abdominal tenderness, no focal tenderness.  No flank tenderness.  Labs ordered in triage reviewed and interpreted by me -CBC without leukocytosis, normal hemoglobin -CMP with mild hyponatremia 133, glucose of 225, no elevated anion gap, normal bicarb. -Lipase of 153, no prior labs to compare this to -UA with glucose of more than 500, proteinuria, no evidence of infection or blood.  Likely consistent with diagnosis of diabetes.  CT of the abdomen ordered in triage, reviewed and interpreted by me and radiology.  This showed an under distended stomach with an apparent wall and fold thickening felt to be greater than expected secondary to under distention.  Possible gastritis or other gastric pathology, recommended endoscopy.  There is also some distal esophageal wall thickening, suggestive esophagitis.  No evidence of acute  pancreatitis.  There was also noted a 3 mm left lower lobe pulmonary nodule.  Given left-sided abdominal pain, will treat as early pancreatitis given elevated lipase.  Will provide Protonix, several pills of Vicodin as needed for breakthrough pain.  PDMP reviewed and is appropriate.  I attempted to reach out to his PCP however they do not appear to be in our messaging system.  I also referred him back to his GI doctor for further evaluation and treatment.  Return precautions discussed.  All of his questions have been answered to his satisfaction, he voiced understanding is agreeable.  At this stage in the ED course, the patient is medically screened and stable for discharge.  As discussed with Dr. Donnald Garre who is agreeable to the plan and disposition. Final Clinical Impression(s) / ED Diagnoses Final diagnoses:  Elevated lipase  Left sided abdominal pain    Rx / DC Orders ED Discharge Orders         Ordered    pantoprazole (PROTONIX) 20 MG tablet  Daily        12/21/20 2024    HYDROcodone-acetaminophen (NORCO/VICODIN) 5-325 MG tablet  Every 6 hours PRN        12/21/20 2024           Leone Brand 12/21/20 2037    Arby Barrette, MD 12/22/20 0045

## 2020-12-21 NOTE — ED Triage Notes (Signed)
C/o left side abdominal pain x3 weeks. Also c/o diarrhea and urinary frequency. Pt states that he has lost weight and is worried that he has colon ca.

## 2020-12-22 ENCOUNTER — Ambulatory Visit: Payer: BC Managed Care – PPO | Admitting: Cardiology

## 2022-01-09 IMAGING — US US THYROID
1 series · 12 of 25 positions shown · non-contrast
Comparison: None.

CLINICAL DATA: Goiter.

EXAM:
THYROID ULTRASOUND
TECHNIQUE: Ultrasound examination of the thyroid gland and adjacent soft
tissues was performed.

[Series 1: us thyroid · 0.04mm/px · 12 of 59 slices shown]
[im 3/59]
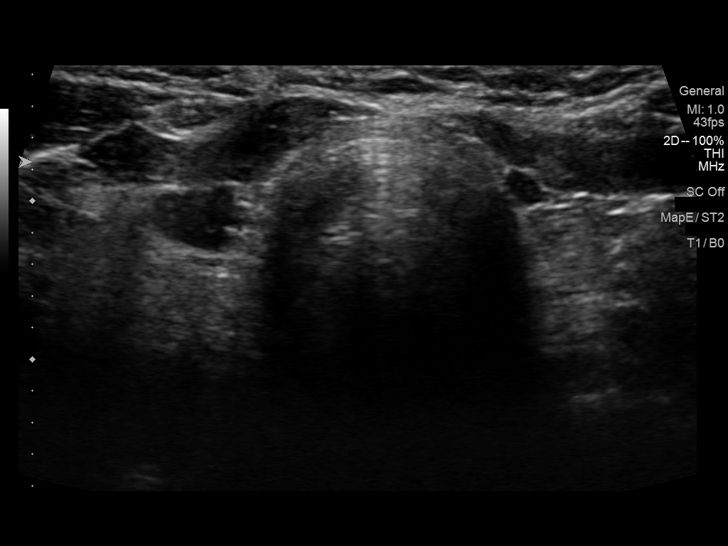
[im 8/59]
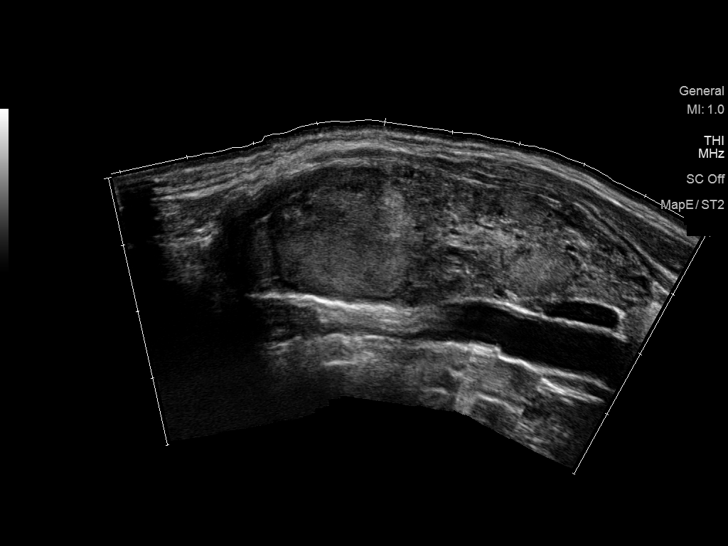
[im 13/59]
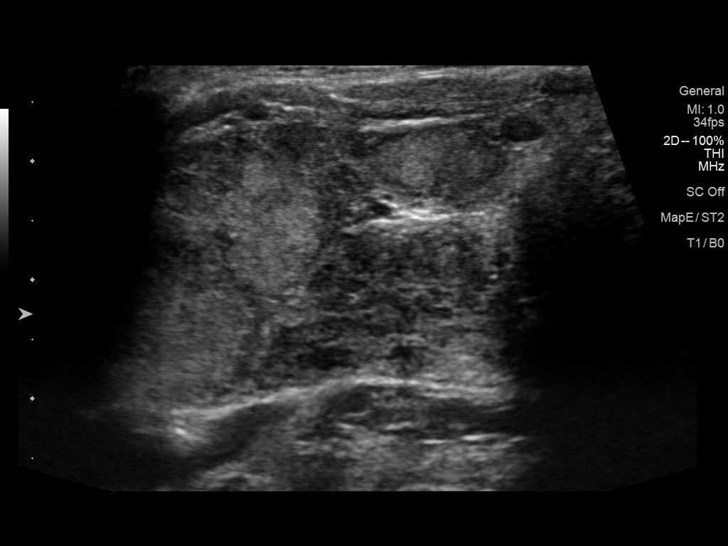
[im 17/59]
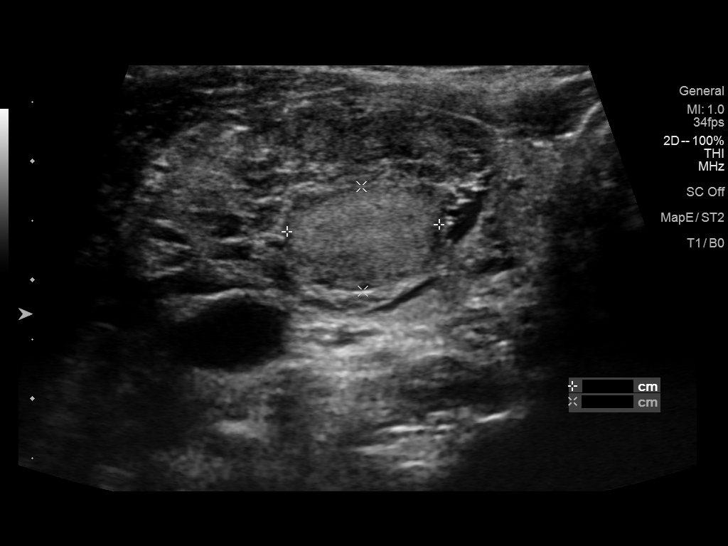
[im 22/59]
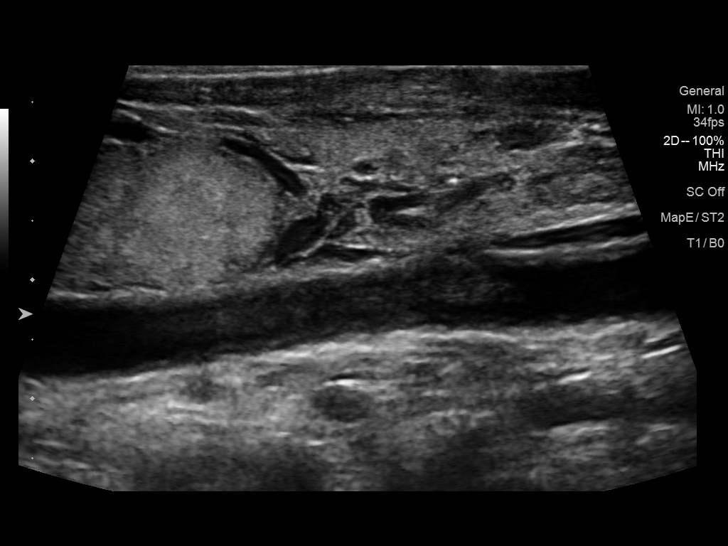
[im 27/59]
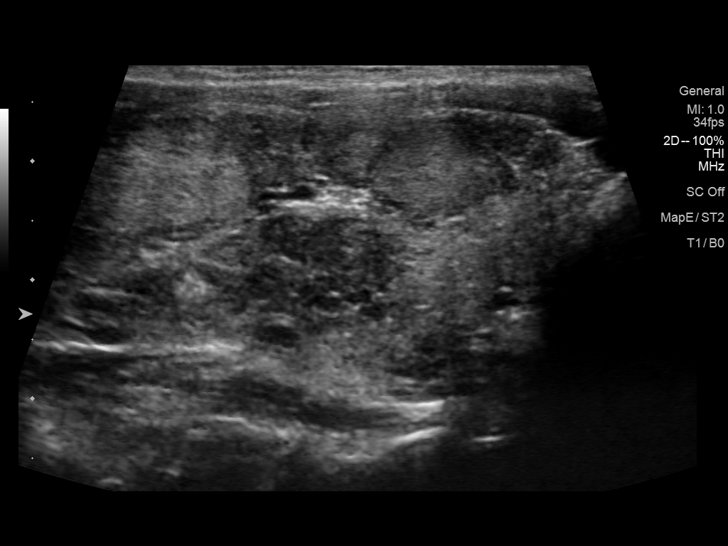
[im 32/59]
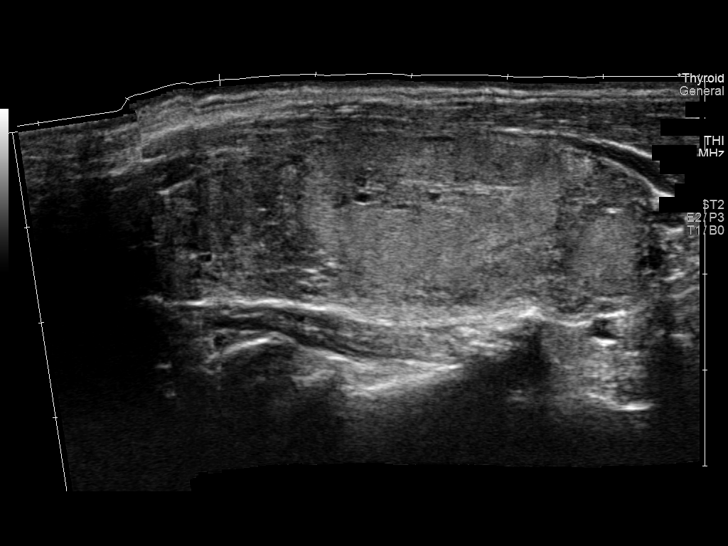
[im 37/59]
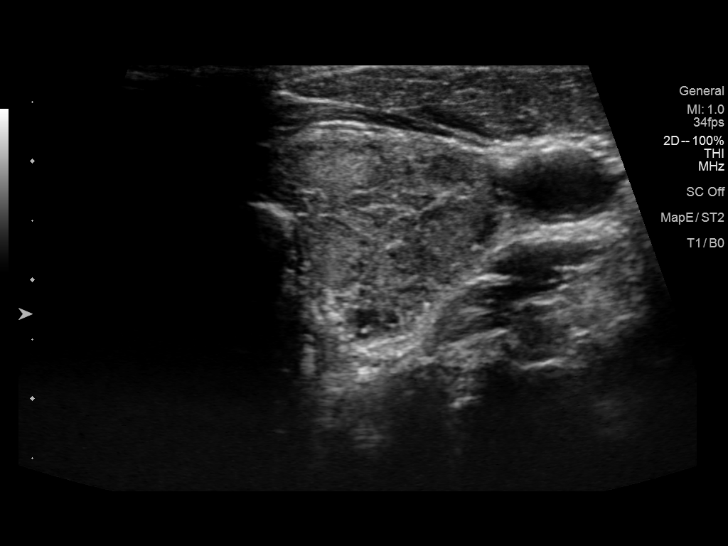
[im 42/59]
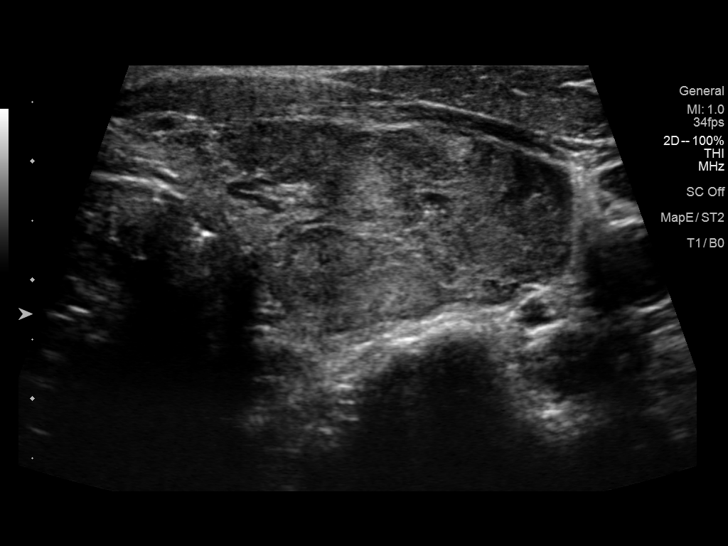
[im 46/59]
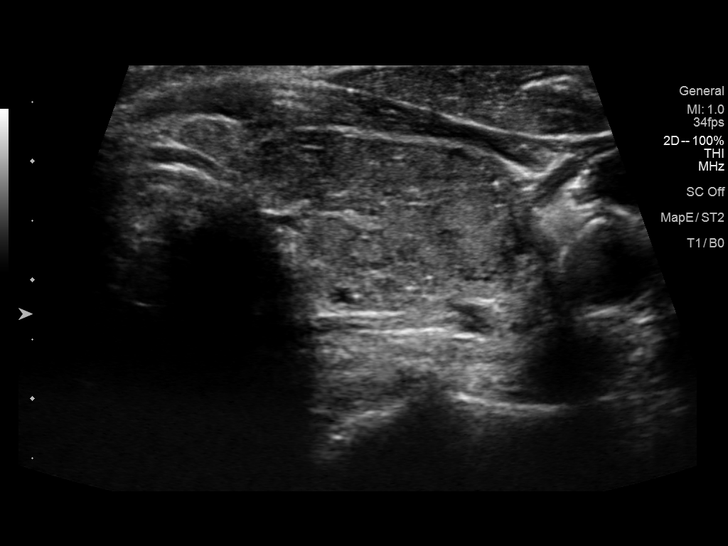
[im 51/59]
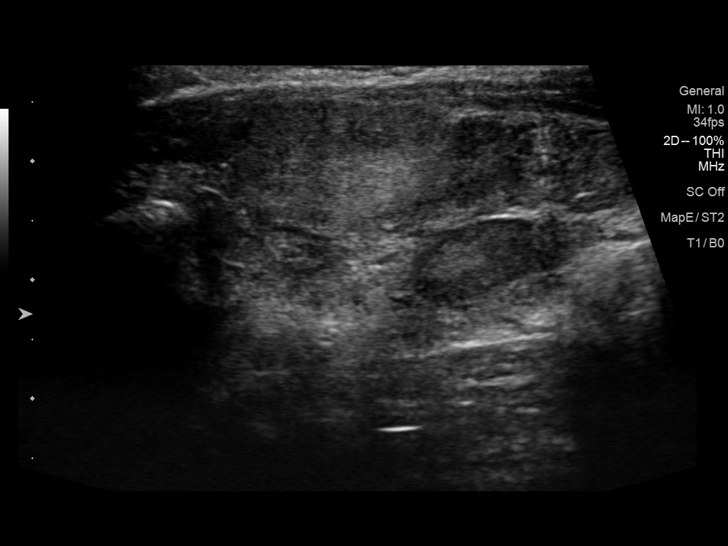
[im 56/59]
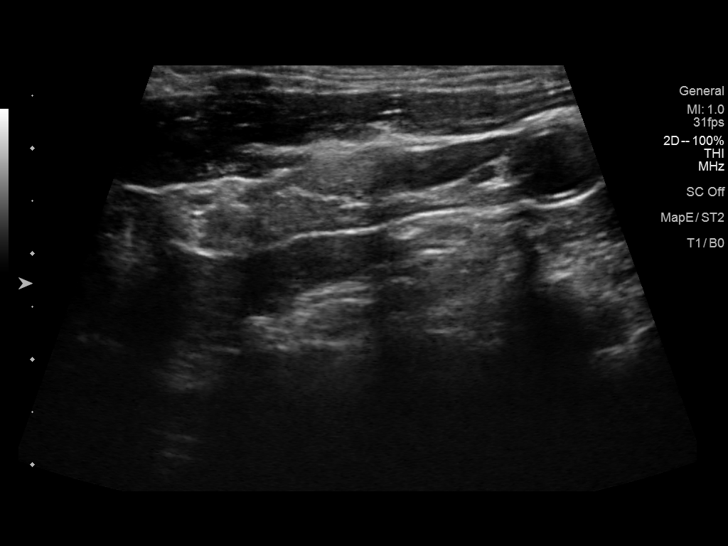

[12 of 25 positions shown; findings below may reference images not displayed]

FINDINGS: Parenchymal Echotexture: Markedly heterogenous

Isthmus: 0.2 cm

Right lobe: 6.2 x 2.2 x 3.1 cm

Left lobe: 5.7 x 2.0 x 3.5 cm

_________________________________________________________

Estimated total number of nodules >/= 1 cm: 3

Number of spongiform nodules >/=  2 cm not described below (TR1): 0

Number of mixed cystic and solid nodules >/= 1.5 cm not described
below (TR2): 0

_________________________________________________________

Nodule # 1:

Location: Right; Superior

Maximum size: 3.2 cm; Other 2 dimensions: 3.0 x 1.7 cm

Composition: solid/almost completely solid (2)

Echogenicity: isoechoic (1)

Shape: not taller-than-wide (0)

Margins: smooth (0)

Echogenic foci: none (0)

ACR TI-RADS total points: 3.

ACR TI-RADS risk category: TR3 (3 points).

ACR TI-RADS recommendations:

**Given size (>/= 2.5 cm) and appearance, fine needle aspiration of
this mildly suspicious nodule should be considered based on TI-RADS
criteria.

_________________________________________________________

Nodule # 2:

Location: Right; Inferior

Maximum size: 1.3 cm; Other 2 dimensions: 1.1 x 0.9 cm

Composition: solid/almost completely solid (2)

Echogenicity: hyperechoic (1)

Shape: not taller-than-wide (0)

Margins: ill-defined (0)

Echogenic foci: none (0)

ACR TI-RADS total points: 3.

ACR TI-RADS risk category: TR3 (3 points).

ACR TI-RADS recommendations:

Given size (<1.4 cm) and appearance, this nodule does NOT meet
TI-RADS criteria for biopsy or dedicated follow-up.

_________________________________________________________

Nodule # 3:

Location: Left; Superior

Maximum size: 0.7 cm; Other 2 dimensions: 0.7 x 0.6 cm

Composition: solid/almost completely solid (2)

Echogenicity: hypoechoic (2)

Shape: not taller-than-wide (0)

Margins: smooth (0)

Echogenic foci: none (0)

ACR TI-RADS total points: 4.

ACR TI-RADS risk category: TR4 (4-6 points).

ACR TI-RADS recommendations:

Given size (<0.9 cm) and appearance, this nodule does NOT meet
TI-RADS criteria for biopsy or dedicated follow-up.

_________________________________________________________

Nodule # 4:

Location: Left; Mid

Maximum size: 3.2 cm; Other 2 dimensions: 2.9 x 1.6 cm

Composition: solid/almost completely solid (2)

Echogenicity: isoechoic (1)

Shape: not taller-than-wide (0)

Margins: ill-defined (0)

Echogenic foci: none (0)

ACR TI-RADS total points: 3.

ACR TI-RADS risk category: TR3 (3 points).

ACR TI-RADS recommendations:

**Given size (>/= 2.5 cm) and appearance, fine needle aspiration of
this mildly suspicious nodule should be considered based on TI-RADS
criteria.

_________________________________________________________

Area of subtle nodularity measured in the inferior left lobe of
cm appears likely to be a pseudo nodule. This does not meet criteria
for biopsy or follow-up if categorized as a true nodule. No abnormal
lymph nodes identified.
IMPRESSION: 3.2 cm right superior (nodule 1) and 3.2 cm left mid (nodule 4)
thyroid nodules meet criteria for fine-needle aspiration. Additional
1.3 cm right inferior and 0.7 cm left superior nodules do not meet
criteria for biopsy or dedicated follow-up.

The above is in keeping with the ACR TI-RADS recommendations - [HOSPITAL] 1192;[DATE].

## 2022-12-25 ENCOUNTER — Ambulatory Visit: Payer: BC Managed Care – PPO | Admitting: Cardiology

## 2022-12-25 ENCOUNTER — Encounter: Payer: Self-pay | Admitting: Cardiology

## 2022-12-25 VITALS — BP 149/94 | HR 92 | Resp 16 | Ht 66.0 in | Wt 145.0 lb

## 2022-12-25 DIAGNOSIS — I10 Essential (primary) hypertension: Secondary | ICD-10-CM

## 2022-12-25 DIAGNOSIS — I25118 Atherosclerotic heart disease of native coronary artery with other forms of angina pectoris: Secondary | ICD-10-CM | POA: Insufficient documentation

## 2022-12-25 DIAGNOSIS — R079 Chest pain, unspecified: Secondary | ICD-10-CM

## 2022-12-25 MED ORDER — METOPROLOL SUCCINATE ER 50 MG PO TB24: 50.0000 mg | ORAL_TABLET | Freq: Every day | ORAL | 3 refills | Status: DC

## 2022-12-25 MED ORDER — LOSARTAN POTASSIUM 25 MG PO TABS: 25.0000 mg | ORAL_TABLET | Freq: Every day | ORAL | 3 refills | Status: DC

## 2022-12-25 MED ORDER — NITROGLYCERIN 0.4 MG SL SUBL: SUBLINGUAL_TABLET | SUBLINGUAL | 1 refills | Status: DC

## 2022-12-25 MED ORDER — ASPIRIN 81 MG PO TBEC: 81.0000 mg | DELAYED_RELEASE_TABLET | Freq: Every day | ORAL | 3 refills | Status: DC

## 2022-12-25 MED ORDER — ATORVASTATIN CALCIUM 40 MG PO TABS: 40.0000 mg | ORAL_TABLET | Freq: Every day | ORAL | 2 refills | Status: DC

## 2022-12-25 NOTE — Progress Notes (Signed)
Follow up visit  Subjective:   Charles Dorsey, male    DOB: 07/30/1961, 62 y.o.   MRN: [id number]     HPI  Chief Complaint  Patient presents with   Coronary Artery Disease   Follow-up    3 month    62 y.o. [place] male with hypertension, hyperlipidemia, type 2 DM, family h/o early CAD, CAD s/p LAD PCI 02/2020   Patient was last seen by me in 08/2020.  He has come off several medications since then, and has not kept up with follow-up.  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  He is had episodes of left-sided pain lasting for 2 minutes, sometimes at rest, sometimes with heavy physical exertion, associated with mild shortness of breath.  He is not taking any medications regularly at this time.  Unfortunately, he continues to smoke 2-3 cigarettes a day.  Blood pressure elevated today.  No recent labs available for me.  He has changed PCPs, now sees Dr. Sandi Mariscal with [facility] medical, reportedly had labs checked 2 months ago which are not available for me.    Current Outpatient Medications:    aspirin EC 81 MG tablet, Take 1 tablet (81 mg total) by mouth daily., Disp: 30 tablet, Rfl: 2   amLODipine (NORVASC) 5 MG tablet, Take 1 tablet (5 mg total) by mouth daily. (Patient not taking: Reported on 12/25/2022), Disp: 180 tablet, Rfl: 3   atorvastatin (LIPITOR) 40 MG tablet, Take 1 tablet (40 mg total) by mouth daily. (Patient not taking: Reported on 12/25/2022), Disp: 30 tablet, Rfl: 2   clopidogrel (PLAVIX) 75 MG tablet, Take 1 tablet (75 mg total) by mouth daily. (Patient not taking: Reported on 12/25/2022), Disp: 90 tablet, Rfl: 3   metoprolol succinate (TOPROL-XL) 50 MG 24 hr tablet, Take 1 tablet (50 mg total) by mouth daily. Take with or immediately following a meal. (Patient not taking: Reported on 12/25/2022), Disp: 90 tablet, Rfl: 3   nitroGLYCERIN (NITROSTAT) 0.4 MG SL tablet, PLACE 1 TABLET UNDER TONGUE EVERY 5 MINUTES  AS NEEDED CHEST PAIN (Patient not taking: Reported on 12/25/2022), Disp: 25 tablet, Rfl: 1   pantoprazole (PROTONIX) 20 MG tablet, Take 1 tablet (20 mg total) by mouth daily. (Patient not taking: Reported on 12/25/2022), Disp: 30 tablet, Rfl: 0   Cardiovascular & other pertient studies:  Reviewed external labs and tests, independently interpreted  EKG 12/25/2022: Sinus rhythm 90 bpm Normal EKG  Coronary intervention 03/18/2020: LM: Normal LAD: Prox 90%, mid 80% stenoses Ramus: Small caliber, mid 80% stenosis. Not amenable to revascularization due to small caliber LCx: Medium sized OM1 with mild 30% disease        Small AV grove circumflex with mid 50% stenosis RCA: Mid 40% stenosis   Intrasvascular ultrasound (IVUS) guided successful percutaneous coronary intervention      Cutting balloon angioplasty and PTCA and stent placement      2.0 X 12 mm Resolute Onyx drug-eluting stent mid LAD     2.25 X 26 mm Resolute Onyx drug-eluting stent ostial-prox LAD        Proximal post dilatation using 3.0X15 mm [place] balloon     Recent labs: 12/21/2020: Glucose 225, BUN/Cr 15/0.81. EGFR >60. Na/K 133/3.9. AlKP 143. Lipase 153. Rest of the CMP normal H/H 16/48. MCV 92. Platelets 215   Review of Systems  Cardiovascular:  Positive for chest pain and dyspnea on exertion. Negative for leg  swelling, palpitations and syncope.         Vitals:   12/25/22 1044 12/25/22 1050  BP: (!) 156/90 (!) 149/94  Pulse: 90 92  Resp: 16   SpO2: 96%     Body mass index is 23.4 kg/m. Filed Weights   12/25/22 1044  Weight: 145 lb (65.8 kg)     Objective:   Physical Exam Vitals and nursing note reviewed.  Constitutional:      General: He is not in acute distress. Neck:     Vascular: No JVD.  Cardiovascular:     Rate and Rhythm: Normal rate and regular rhythm.     Heart sounds: Normal heart sounds. No murmur heard. Pulmonary:     Effort: Pulmonary effort is normal.     Breath sounds: Normal breath  sounds. No wheezing or rales.  Musculoskeletal:     Right lower leg: No edema.     Left lower leg: No edema.            Visit diagnoses:   ICD-10-CM   1. Coronary artery disease of native artery of native heart with stable angina pectoris (HCC)  I25.118 EKG 12-Lead    metoprolol succinate (TOPROL-XL) 50 MG 24 hr tablet    atorvastatin (LIPITOR) 40 MG tablet    aspirin EC 81 MG tablet    losartan (COZAAR) 25 MG tablet    Lipid panel    HgB A1c    PCV ECHOCARDIOGRAM COMPLETE    PCV MYOCARDIAL PERFUSION WO LEXISCAN    nitroGLYCERIN (NITROSTAT) 0.4 MG SL tablet    Comprehensive Metabolic Panel (CMET)    2. Unstable angina (HCC)  I20.0 atorvastatin (LIPITOR) 40 MG tablet    3. Exertional chest pain  R07.9 atorvastatin (LIPITOR) 40 MG tablet    nitroGLYCERIN (NITROSTAT) 0.4 MG SL tablet       Orders Placed This Encounter  Procedures   Lipid panel   HgB A1c   Comprehensive Metabolic Panel (CMET)   PCV MYOCARDIAL PERFUSION WO LEXISCAN   EKG 12-Lead   PCV ECHOCARDIOGRAM COMPLETE     Meds ordered this encounter  Medications   metoprolol succinate (TOPROL-XL) 50 MG 24 hr tablet    Sig: Take 1 tablet (50 mg total) by mouth daily. Take with or immediately following a meal.    Dispense:  90 tablet    Refill:  3   atorvastatin (LIPITOR) 40 MG tablet    Sig: Take 1 tablet (40 mg total) by mouth daily.    Dispense:  90 tablet    Refill:  2   aspirin EC 81 MG tablet    Sig: Take 1 tablet (81 mg total) by mouth daily. Swallow whole.    Dispense:  90 tablet    Refill:  3   losartan (COZAAR) 25 MG tablet    Sig: Take 1 tablet (25 mg total) by mouth daily.    Dispense:  90 tablet    Refill:  3   nitroGLYCERIN (NITROSTAT) 0.4 MG SL tablet    Sig: PLACE 1 TABLET UNDER TONGUE EVERY 5 MINUTES AS NEEDED CHEST PAIN    Dispense:  25 tablet    Refill:  1     Assessment & Recommendations:   62 y.o.  [place] male with hypertension, hyperlipidemia, type 2 DM, family h/o early  CAD, CAD s/p LAD PCI 02/2020   CAD: Multivessel CAD s/p LAD PCI in 2021. Recent angina, dyspnea symptoms. He is not on any medical management at this time.  Resume Aspirin 81 mg daily, metoprolol succinate 50 mg daily, losartan 25 mg daily, Crestor 40 mg daily. Also prescribed prn SL NTG. Check echocardiogram, exercise nuclear stress test. Check CMP, lipid panel, A1C   PCP: Sandi Mariscal, MD Shanon Rosser, MD Pager: [phone] Office: [phone]

## 2023-01-05 ENCOUNTER — Ambulatory Visit: Payer: BC Managed Care – PPO

## 2023-01-05 DIAGNOSIS — I25118 Atherosclerotic heart disease of native coronary artery with other forms of angina pectoris: Secondary | ICD-10-CM

## 2023-01-15 ENCOUNTER — Ambulatory Visit: Payer: BC Managed Care – PPO

## 2023-01-15 DIAGNOSIS — I25118 Atherosclerotic heart disease of native coronary artery with other forms of angina pectoris: Secondary | ICD-10-CM

## 2023-02-12 ENCOUNTER — Ambulatory Visit: Payer: BC Managed Care – PPO | Admitting: Cardiology

## 2023-02-16 ENCOUNTER — Encounter: Payer: Self-pay | Admitting: Cardiology

## 2023-02-16 ENCOUNTER — Ambulatory Visit: Payer: BC Managed Care – PPO | Admitting: Cardiology

## 2023-02-16 VITALS — BP 153/76 | HR 85 | Resp 16 | Ht 66.0 in | Wt 146.0 lb

## 2023-02-16 DIAGNOSIS — I10 Essential (primary) hypertension: Secondary | ICD-10-CM

## 2023-02-16 DIAGNOSIS — R079 Chest pain, unspecified: Secondary | ICD-10-CM

## 2023-02-16 DIAGNOSIS — I25118 Atherosclerotic heart disease of native coronary artery with other forms of angina pectoris: Secondary | ICD-10-CM

## 2023-02-16 MED ORDER — ATORVASTATIN CALCIUM 40 MG PO TABS: 40.0000 mg | ORAL_TABLET | Freq: Every day | ORAL | 3 refills | Status: DC

## 2023-02-16 MED ORDER — NITROGLYCERIN 0.4 MG SL SUBL: SUBLINGUAL_TABLET | SUBLINGUAL | 3 refills | Status: AC

## 2023-02-16 MED ORDER — LOSARTAN POTASSIUM 25 MG PO TABS: 25.0000 mg | ORAL_TABLET | Freq: Every day | ORAL | 3 refills | Status: DC

## 2023-02-16 MED ORDER — ASPIRIN 81 MG PO TBEC: 81.0000 mg | DELAYED_RELEASE_TABLET | Freq: Every day | ORAL | 3 refills | Status: DC

## 2023-02-16 MED ORDER — METOPROLOL SUCCINATE ER 50 MG PO TB24: 50.0000 mg | ORAL_TABLET | Freq: Every day | ORAL | 3 refills | Status: DC

## 2023-02-16 NOTE — Progress Notes (Signed)
Follow up visit  Subjective:   Charles Dorsey, male    DOB: [birth year], 62 y.o.   MRN: [id number]     HPI  Chief Complaint  Patient presents with   Coronary Artery Disease   Follow-up    4-6 week    62 y.o. [place] male with hypertension, hyperlipidemia, type 2 DM, family h/o early CAD, CAD s/p LAD PCI 02/2020   Paitet has not had any recurrent chest pain or dyspnea symptoms. Reviewed recent test results with the patient, details below.   He has noticed abdominal discomfort at 3 AM. He has f/u w/GI at [facility].   OV 11/2022: Patient was last seen by me in 08/2020.  He has come off several medications since then, and has not kept up with follow-up.  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  He is had episodes of left-sided pain lasting for 2 minutes, sometimes at rest, sometimes with heavy physical exertion, associated with mild shortness of breath.  He is not taking any medications regularly at this time.  Unfortunately, he continues to smoke 2-3 cigarettes a day.  Blood pressure elevated today.  No recent labs available for me.  He has changed PCPs, now sees Dr. Sandi Mariscal with [facility] medical, reportedly had labs checked 2 months ago which are not available for me.    Current Outpatient Medications:    aspirin EC 81 MG tablet, Take 1 tablet (81 mg total) by mouth daily. Swallow whole., Disp: 90 tablet, Rfl: 3   atorvastatin (LIPITOR) 40 MG tablet, Take 1 tablet (40 mg total) by mouth daily., Disp: 90 tablet, Rfl: 2   losartan (COZAAR) 25 MG tablet, Take 1 tablet (25 mg total) by mouth daily., Disp: 90 tablet, Rfl: 3   metoprolol succinate (TOPROL-XL) 50 MG 24 hr tablet, Take 1 tablet (50 mg total) by mouth daily. Take with or immediately following a meal., Disp: 90 tablet, Rfl: 3   nitroGLYCERIN (NITROSTAT) 0.4 MG SL tablet, PLACE 1 TABLET UNDER TONGUE EVERY 5 MINUTES AS NEEDED CHEST PAIN, Disp: 25 tablet, Rfl: 1    pantoprazole (PROTONIX) 20 MG tablet, Take 1 tablet (20 mg total) by mouth daily., Disp: 30 tablet, Rfl: 0   Cardiovascular & other pertient studies:  Reviewed external labs and tests, independently interpreted  Exercise nuclear stress test 01/15/2023: Myocardial perfusion is normal. Overall LV systolic function is normal without regional wall motion abnormalities. Stress LV EF: 81%. Low risk study. Normal ECG stress. The patient exercised for 9 minutes and 22 seconds of a Bruce protocol, achieving approximately 10.77 METs and 87% MPHR. Peak EKG/ECG demonstrated sinus tachycardia. The heart rate response was normal. The blood pressure response was normal. No previous exam available for comparison.  Echocardiogram 01/06/2023:  Left ventricle cavity is normal in size and wall thickness. Normal global  wall motion. Normal LV systolic function with EF 64%. Normal diastolic  filling pattern.  No significant valvular abnormality.  No evidence of pulmonary hypertension.    EKG 12/25/2022: Sinus rhythm 90 bpm Normal EKG  Coronary intervention 03/18/2020: LM: Normal LAD: Prox 90%, mid 80% stenoses Ramus: Small caliber, mid 80% stenosis. Not amenable to revascularization due to small caliber LCx: Medium sized OM1 with mild 30% disease        Small AV grove circumflex with mid 50% stenosis RCA: Mid 40% stenosis   Intrasvascular ultrasound (IVUS) guided successful percutaneous coronary intervention  Cutting balloon angioplasty and PTCA and stent placement      2.0 X 12 mm Resolute Onyx drug-eluting stent mid LAD     2.25 X 26 mm Resolute Onyx drug-eluting stent ostial-prox LAD        Proximal post dilatation using 3.0X15 mm [place] balloon     Recent labs: 12/21/2020: Glucose 225, BUN/Cr 15/0.81. EGFR >60. Na/K 133/3.9. AlKP 143. Lipase 153. Rest of the CMP normal H/H 16/48. MCV 92. Platelets 215   Review of Systems  Cardiovascular:  Positive for chest pain and dyspnea on exertion.  Negative for leg swelling, palpitations and syncope.         Vitals:   02/16/23 1317  BP: (!) 153/76  Pulse: 85  Resp: 16  SpO2: 98%    There is no height or weight on file to calculate BMI. Filed Weights   02/16/23 1317  Weight: 146 lb (66.2 kg)     Objective:   Physical Exam Vitals and nursing note reviewed.  Constitutional:      General: He is not in acute distress. Neck:     Vascular: No JVD.  Cardiovascular:     Rate and Rhythm: Normal rate and regular rhythm.     Heart sounds: Normal heart sounds. No murmur heard. Pulmonary:     Effort: Pulmonary effort is normal.     Breath sounds: Normal breath sounds. No wheezing or rales.  Musculoskeletal:     Right lower leg: No edema.     Left lower leg: No edema.             Visit diagnoses:   ICD-10-CM   1. Coronary artery disease of native artery of native heart with stable angina pectoris ([place])  I25.118     2. Essential hypertension  I10         Meds ordered this encounter  Medications   metoprolol succinate (TOPROL-XL) 50 MG 24 hr tablet    Sig: Take 1 tablet (50 mg total) by mouth daily. Take with or immediately following a meal.    Dispense:  90 tablet    Refill:  3   losartan (COZAAR) 25 MG tablet    Sig: Take 1 tablet (25 mg total) by mouth daily.    Dispense:  90 tablet    Refill:  3   atorvastatin (LIPITOR) 40 MG tablet    Sig: Take 1 tablet (40 mg total) by mouth daily.    Dispense:  90 tablet    Refill:  3   aspirin EC 81 MG tablet    Sig: Take 1 tablet (81 mg total) by mouth daily. Swallow whole.    Dispense:  90 tablet    Refill:  3   nitroGLYCERIN (NITROSTAT) 0.4 MG SL tablet    Sig: PLACE 1 TABLET UNDER TONGUE EVERY 5 MINUTES AS NEEDED CHEST PAIN    Dispense:  30 tablet    Refill:  3     Assessment & Recommendations:   62 y.o.  [place] male with hypertension, hyperlipidemia, type 2 DM, family h/o early CAD, CAD s/p LAD PCI 02/2020   CAD: Multivessel CAD s/p LAD PCI in  2021. Normal EF, no ischemia on stress test (12/2022). Continue Aspirin 81 mg daily, metoprolol succinate 50 mg daily, losartan 25 mg daily, Crestor 40 mg daily. Also prescribed prn SL NTG. Labs pending F/u w/GI re: diffuse abdominal pain  Hypertension: Depending on lab and subsequent BOP readings, losartan could be increased to 50 mg daily.  F/u in 3 months    [name], MD Pager: [phone] Office: [phone]

## 2023-10-07 ENCOUNTER — Inpatient Hospital Stay (HOSPITAL_COMMUNITY)
Admission: EM | Admit: 2023-10-07 | Discharge: 2023-10-19 | DRG: 082 | Disposition: A | Discharge status: Rehabilitation Facility | Payer: BC Managed Care – PPO | Attending: Surgery | Admitting: Surgery

## 2023-10-07 ENCOUNTER — Emergency Department (HOSPITAL_COMMUNITY): Payer: Self-pay

## 2023-10-07 ENCOUNTER — Encounter (HOSPITAL_COMMUNITY): Payer: Self-pay

## 2023-10-07 DIAGNOSIS — Z833 Family history of diabetes mellitus: Secondary | ICD-10-CM

## 2023-10-07 DIAGNOSIS — S02602A Fracture of unspecified part of body of left mandible, initial encounter for closed fracture: Secondary | ICD-10-CM | POA: Diagnosis not present

## 2023-10-07 DIAGNOSIS — I10 Essential (primary) hypertension: Secondary | ICD-10-CM | POA: Diagnosis present

## 2023-10-07 DIAGNOSIS — E871 Hypo-osmolality and hyponatremia: Secondary | ICD-10-CM | POA: Diagnosis present

## 2023-10-07 DIAGNOSIS — S066XAA Traumatic subarachnoid hemorrhage with loss of consciousness status unknown, initial encounter: Secondary | ICD-10-CM | POA: Diagnosis present

## 2023-10-07 DIAGNOSIS — Z8249 Family history of ischemic heart disease and other diseases of the circulatory system: Secondary | ICD-10-CM

## 2023-10-07 DIAGNOSIS — E785 Hyperlipidemia, unspecified: Secondary | ICD-10-CM | POA: Diagnosis present

## 2023-10-07 DIAGNOSIS — F1010 Alcohol abuse, uncomplicated: Secondary | ICD-10-CM | POA: Diagnosis present

## 2023-10-07 DIAGNOSIS — S0219XA Other fracture of base of skull, initial encounter for closed fracture: Secondary | ICD-10-CM | POA: Diagnosis present

## 2023-10-07 DIAGNOSIS — Z7982 Long term (current) use of aspirin: Secondary | ICD-10-CM | POA: Diagnosis not present

## 2023-10-07 DIAGNOSIS — K219 Gastro-esophageal reflux disease without esophagitis: Secondary | ICD-10-CM | POA: Diagnosis present

## 2023-10-07 DIAGNOSIS — W11XXXA Fall on and from ladder, initial encounter: Secondary | ICD-10-CM | POA: Diagnosis present

## 2023-10-07 DIAGNOSIS — E1165 Type 2 diabetes mellitus with hyperglycemia: Secondary | ICD-10-CM | POA: Diagnosis not present

## 2023-10-07 DIAGNOSIS — E876 Hypokalemia: Secondary | ICD-10-CM | POA: Diagnosis present

## 2023-10-07 DIAGNOSIS — G936 Cerebral edema: Secondary | ICD-10-CM | POA: Diagnosis present

## 2023-10-07 DIAGNOSIS — S069X9S Unspecified intracranial injury with loss of consciousness of unspecified duration, sequela: Secondary | ICD-10-CM | POA: Diagnosis not present

## 2023-10-07 DIAGNOSIS — Z83438 Family history of other disorder of lipoprotein metabolism and other lipidemia: Secondary | ICD-10-CM

## 2023-10-07 DIAGNOSIS — E039 Hypothyroidism, unspecified: Secondary | ICD-10-CM | POA: Diagnosis present

## 2023-10-07 DIAGNOSIS — E440 Moderate protein-calorie malnutrition: Secondary | ICD-10-CM | POA: Insufficient documentation

## 2023-10-07 DIAGNOSIS — I2510 Atherosclerotic heart disease of native coronary artery without angina pectoris: Secondary | ICD-10-CM | POA: Diagnosis present

## 2023-10-07 DIAGNOSIS — Y903 Blood alcohol level of 60-79 mg/100 ml: Secondary | ICD-10-CM | POA: Diagnosis present

## 2023-10-07 DIAGNOSIS — R451 Restlessness and agitation: Secondary | ICD-10-CM

## 2023-10-07 DIAGNOSIS — G44311 Acute post-traumatic headache, intractable: Secondary | ICD-10-CM | POA: Diagnosis not present

## 2023-10-07 DIAGNOSIS — Z794 Long term (current) use of insulin: Secondary | ICD-10-CM | POA: Diagnosis not present

## 2023-10-07 DIAGNOSIS — S065XAA Traumatic subdural hemorrhage with loss of consciousness status unknown, initial encounter: Secondary | ICD-10-CM | POA: Diagnosis present

## 2023-10-07 DIAGNOSIS — R410 Disorientation, unspecified: Secondary | ICD-10-CM | POA: Diagnosis present

## 2023-10-07 DIAGNOSIS — E119 Type 2 diabetes mellitus without complications: Secondary | ICD-10-CM | POA: Diagnosis present

## 2023-10-07 DIAGNOSIS — S069XAD Unspecified intracranial injury with loss of consciousness status unknown, subsequent encounter: Secondary | ICD-10-CM | POA: Diagnosis not present

## 2023-10-07 DIAGNOSIS — R7989 Other specified abnormal findings of blood chemistry: Secondary | ICD-10-CM | POA: Diagnosis not present

## 2023-10-07 DIAGNOSIS — G510 Bell's palsy: Secondary | ICD-10-CM | POA: Diagnosis present

## 2023-10-07 DIAGNOSIS — S069X9A Unspecified intracranial injury with loss of consciousness of unspecified duration, initial encounter: Secondary | ICD-10-CM | POA: Diagnosis not present

## 2023-10-07 DIAGNOSIS — Y92009 Unspecified place in unspecified non-institutional (private) residence as the place of occurrence of the external cause: Secondary | ICD-10-CM | POA: Diagnosis not present

## 2023-10-07 DIAGNOSIS — W19XXXA Unspecified fall, initial encounter: Principal | ICD-10-CM

## 2023-10-07 DIAGNOSIS — S062XAA Diffuse traumatic brain injury with loss of consciousness status unknown, initial encounter: Secondary | ICD-10-CM | POA: Diagnosis present

## 2023-10-07 DIAGNOSIS — F17210 Nicotine dependence, cigarettes, uncomplicated: Secondary | ICD-10-CM | POA: Diagnosis present

## 2023-10-07 DIAGNOSIS — I609 Nontraumatic subarachnoid hemorrhage, unspecified: Secondary | ICD-10-CM

## 2023-10-07 DIAGNOSIS — Z23 Encounter for immunization: Secondary | ICD-10-CM | POA: Diagnosis not present

## 2023-10-07 DIAGNOSIS — S069X9D Unspecified intracranial injury with loss of consciousness of unspecified duration, subsequent encounter: Secondary | ICD-10-CM | POA: Diagnosis not present

## 2023-10-07 DIAGNOSIS — R5381 Other malaise: Secondary | ICD-10-CM | POA: Diagnosis not present

## 2023-10-07 DIAGNOSIS — Z789 Other specified health status: Secondary | ICD-10-CM

## 2023-10-07 HISTORY — DX: Nontoxic single thyroid nodule: E04.1

## 2023-10-07 HISTORY — DX: Insomnia, unspecified: G47.00

## 2023-10-07 HISTORY — DX: Atherosclerotic heart disease of native coronary artery without angina pectoris: I25.10

## 2023-10-07 LAB — GLUCOSE, CAPILLARY
Glucose-Capillary: 220 mg/dL — ABNORMAL HIGH (ref 70–99)
Glucose-Capillary: 220 mg/dL — ABNORMAL HIGH (ref 70–99)

## 2023-10-07 LAB — COMPREHENSIVE METABOLIC PANEL
ALT: 56 U/L — ABNORMAL HIGH (ref 0–44)
AST: 78 U/L — ABNORMAL HIGH (ref 15–41)
Albumin: 3.8 g/dL (ref 3.5–5.0)
Alkaline Phosphatase: 134 U/L — ABNORMAL HIGH (ref 38–126)
Anion gap: 11 (ref 5–15)
BUN: 16 mg/dL (ref 6–20)
CO2: 20 mmol/L — ABNORMAL LOW (ref 22–32)
Calcium: 8.8 mg/dL — ABNORMAL LOW (ref 8.9–10.3)
Chloride: 105 mmol/L (ref 98–111)
Creatinine, Ser: 0.71 mg/dL (ref 0.61–1.24)
GFR, Estimated: >60 mL/min (ref >60)
Glucose, Bld: 238 mg/dL — ABNORMAL HIGH (ref 70–99)
Potassium: 3.6 mmol/L (ref 3.5–5.1)
Sodium: 136 mmol/L (ref 135–145)
Total Bilirubin: 1 mg/dL (ref <1.2)
Total Protein: 6.8 g/dL (ref 6.5–8.1)

## 2023-10-07 LAB — I-STAT CHEM 8, ED
BUN: 20 mg/dL (ref 6–20)
Calcium, Ion: 1 mmol/L — ABNORMAL LOW (ref 1.15–1.40)
Chloride: 105 mmol/L (ref 98–111)
Creatinine, Ser: 0.8 mg/dL (ref 0.61–1.24)
Glucose, Bld: 239 mg/dL — ABNORMAL HIGH (ref 70–99)
HCT: 46 % (ref 39.0–52.0)
Hemoglobin: 15.6 g/dL (ref 13.0–17.0)
Potassium: 3.5 mmol/L (ref 3.5–5.1)
Sodium: 139 mmol/L (ref 135–145)
TCO2: 21 mmol/L — ABNORMAL LOW (ref 22–32)

## 2023-10-07 LAB — CBC
HCT: 43.2 % (ref 39.0–52.0)
Hemoglobin: 15.2 g/dL (ref 13.0–17.0)
MCH: 31.7 pg (ref 26.0–34.0)
MCHC: 35.2 g/dL (ref 30.0–36.0)
MCV: 90 fL (ref 80.0–100.0)
Platelets: 228 10*3/uL (ref 150–400)
RBC: 4.8 MIL/uL (ref 4.22–5.81)
RDW: 11.9 % (ref 11.5–15.5)
WBC: 8 10*3/uL (ref 4.0–10.5)
nRBC: 0 % (ref 0.0–0.2)

## 2023-10-07 LAB — PROTIME-INR
INR: 0.9 (ref 0.8–1.2)
Prothrombin Time: 12.7 s (ref 11.4–15.2)

## 2023-10-07 LAB — MRSA NEXT GEN BY PCR, NASAL: MRSA by PCR Next Gen: NOT DETECTED

## 2023-10-07 LAB — SAMPLE TO BLOOD BANK

## 2023-10-07 LAB — I-STAT CG4 LACTIC ACID, ED: Lactic Acid, Venous: 3.4 mmol/L (ref 0.5–1.9)

## 2023-10-07 LAB — ETHANOL: Alcohol, Ethyl (B): 62 mg/dL — ABNORMAL HIGH (ref <10)

## 2023-10-07 LAB — CBG MONITORING, ED: Glucose-Capillary: 238 mg/dL — ABNORMAL HIGH (ref 70–99)

## 2023-10-07 MED ORDER — DIPHENHYDRAMINE HCL 50 MG/ML IJ SOLN
INTRAMUSCULAR | Status: AC
  Filled 2023-10-07: qty 1

## 2023-10-07 MED ORDER — DOCUSATE SODIUM 100 MG PO CAPS
100.0000 mg | ORAL_CAPSULE | Freq: Two times a day (BID) | ORAL | Status: DC
Start: 2023-10-07 — End: 2023-10-19
  Administered 2023-10-08 – 2023-10-19 (×19): 100 mg via ORAL
  Filled 2023-10-07 (×21): qty 1

## 2023-10-07 MED ORDER — HALOPERIDOL LACTATE 5 MG/ML IJ SOLN
2.5000 mg | Freq: Once | INTRAMUSCULAR | Status: AC
  Administered 2023-10-07: 2.5 mg via INTRAVENOUS

## 2023-10-07 MED ORDER — METOPROLOL TARTRATE 5 MG/5ML IV SOLN
5.0000 mg | Freq: Four times a day (QID) | INTRAVENOUS | Status: DC | PRN
  Administered 2023-10-13: 5 mg via INTRAVENOUS
  Filled 2023-10-07: qty 5

## 2023-10-07 MED ORDER — DEXMEDETOMIDINE HCL IN NACL 400 MCG/100ML IV SOLN
0.0000 ug/kg/h | INTRAVENOUS | Status: DC
  Administered 2023-10-07 – 2023-10-08 (×2): 0.4 ug/kg/h via INTRAVENOUS
  Administered 2023-10-08: 0.5 ug/kg/h via INTRAVENOUS
  Filled 2023-10-07 (×2): qty 100

## 2023-10-07 MED ORDER — PANTOPRAZOLE SODIUM 40 MG PO TBEC: 40.0000 mg | DELAYED_RELEASE_TABLET | Freq: Every day | ORAL | Status: DC

## 2023-10-07 MED ORDER — LORAZEPAM 2 MG/ML IJ SOLN
2.0000 mg | Freq: Once | INTRAMUSCULAR | Status: AC
  Administered 2023-10-07: 2 mg via INTRAVENOUS

## 2023-10-07 MED ORDER — LORAZEPAM 2 MG/ML IJ SOLN
0.0000 mg | Freq: Two times a day (BID) | INTRAMUSCULAR | Status: AC
  Administered 2023-10-10: 2 mg via INTRAVENOUS
  Administered 2023-10-11: 1 mg via INTRAVENOUS
  Filled 2023-10-07 (×3): qty 1

## 2023-10-07 MED ORDER — ACETAMINOPHEN 650 MG RE SUPP
650.0000 mg | Freq: Four times a day (QID) | RECTAL | Status: DC
  Administered 2023-10-08 (×2): 650 mg via RECTAL
  Filled 2023-10-07 (×2): qty 1

## 2023-10-07 MED ORDER — LORAZEPAM 2 MG/ML IJ SOLN
INTRAMUSCULAR | Status: AC
  Filled 2023-10-07: qty 1

## 2023-10-07 MED ORDER — DIPHENHYDRAMINE HCL 50 MG/ML IJ SOLN
50.0000 mg | Freq: Once | INTRAMUSCULAR | Status: AC
Start: 2023-10-07 — End: 2023-10-07
  Administered 2023-10-07: 50 mg via INTRAVENOUS

## 2023-10-07 MED ORDER — INSULIN ASPART 100 UNIT/ML IJ SOLN
0.0000 [IU] | INTRAMUSCULAR | Status: DC
  Administered 2023-10-07: 3 [IU] via SUBCUTANEOUS
  Administered 2023-10-08 (×2): 2 [IU] via SUBCUTANEOUS
  Administered 2023-10-08: 1 [IU] via SUBCUTANEOUS
  Administered 2023-10-08: 3 [IU] via SUBCUTANEOUS
  Administered 2023-10-08: 2 [IU] via SUBCUTANEOUS
  Administered 2023-10-08 – 2023-10-09 (×2): 3 [IU] via SUBCUTANEOUS
  Administered 2023-10-09: 5 [IU] via SUBCUTANEOUS
  Administered 2023-10-09: 2 [IU] via SUBCUTANEOUS
  Administered 2023-10-09: 1 [IU] via SUBCUTANEOUS
  Administered 2023-10-09: 2 [IU] via SUBCUTANEOUS
  Administered 2023-10-09 (×2): 5 [IU] via SUBCUTANEOUS
  Administered 2023-10-10: 2 [IU] via SUBCUTANEOUS
  Administered 2023-10-10 (×2): 1 [IU] via SUBCUTANEOUS
  Administered 2023-10-10: 2 [IU] via SUBCUTANEOUS
  Administered 2023-10-11 (×2): 1 [IU] via SUBCUTANEOUS
  Administered 2023-10-11 (×2): 2 [IU] via SUBCUTANEOUS
  Administered 2023-10-11: 1 [IU] via SUBCUTANEOUS
  Administered 2023-10-12 – 2023-10-13 (×7): 2 [IU] via SUBCUTANEOUS
  Administered 2023-10-13 (×2): 1 [IU] via SUBCUTANEOUS
  Administered 2023-10-13: 2 [IU] via SUBCUTANEOUS
  Administered 2023-10-13 – 2023-10-14 (×3): 1 [IU] via SUBCUTANEOUS
  Administered 2023-10-14: 2 [IU] via SUBCUTANEOUS
  Administered 2023-10-14: 5 [IU] via SUBCUTANEOUS
  Administered 2023-10-15 (×2): 2 [IU] via SUBCUTANEOUS
  Administered 2023-10-15: 1 [IU] via SUBCUTANEOUS
  Administered 2023-10-15 – 2023-10-16 (×2): 3 [IU] via SUBCUTANEOUS
  Administered 2023-10-16: 1 [IU] via SUBCUTANEOUS
  Administered 2023-10-16: 2 [IU] via SUBCUTANEOUS
  Administered 2023-10-16: 1 [IU] via SUBCUTANEOUS
  Administered 2023-10-17: 2 [IU] via SUBCUTANEOUS
  Administered 2023-10-17: 3 [IU] via SUBCUTANEOUS
  Administered 2023-10-17: 2 [IU] via SUBCUTANEOUS
  Administered 2023-10-17: 3 [IU] via SUBCUTANEOUS
  Administered 2023-10-17: 1 [IU] via SUBCUTANEOUS
  Administered 2023-10-17: 2 [IU] via SUBCUTANEOUS
  Administered 2023-10-18: 1 [IU] via SUBCUTANEOUS
  Administered 2023-10-18: 3 [IU] via SUBCUTANEOUS
  Administered 2023-10-18 (×2): 1 [IU] via SUBCUTANEOUS
  Administered 2023-10-18: 3 [IU] via SUBCUTANEOUS
  Administered 2023-10-18: 2 [IU] via SUBCUTANEOUS
  Administered 2023-10-19: 5 [IU] via SUBCUTANEOUS
  Administered 2023-10-19: 1 [IU] via SUBCUTANEOUS
  Administered 2023-10-19: 2 [IU] via SUBCUTANEOUS

## 2023-10-07 MED ORDER — METHOCARBAMOL 500 MG PO TABS
500.0000 mg | ORAL_TABLET | Freq: Three times a day (TID) | ORAL | Status: AC
  Administered 2023-10-08 – 2023-10-10 (×7): 500 mg via ORAL
  Filled 2023-10-07 (×7): qty 1

## 2023-10-07 MED ORDER — HYDRALAZINE HCL 20 MG/ML IJ SOLN
10.0000 mg | INTRAMUSCULAR | Status: DC | PRN
  Filled 2023-10-07: qty 1

## 2023-10-07 MED ORDER — LABETALOL HCL 5 MG/ML IV SOLN
20.0000 mg | Freq: Once | INTRAVENOUS | Status: AC
  Administered 2023-10-07: 20 mg via INTRAVENOUS
  Filled 2023-10-07: qty 4

## 2023-10-07 MED ORDER — POLYETHYLENE GLYCOL 3350 17 G PO PACK: 17.0000 g | PACK | Freq: Every day | ORAL | Status: DC | PRN

## 2023-10-07 MED ORDER — MORPHINE SULFATE (PF) 2 MG/ML IV SOLN
2.0000 mg | INTRAVENOUS | Status: DC | PRN
  Administered 2023-10-10: 2 mg via INTRAVENOUS
  Filled 2023-10-07: qty 1

## 2023-10-07 MED ORDER — PANTOPRAZOLE SODIUM 40 MG IV SOLR
40.0000 mg | Freq: Every day | INTRAVENOUS | Status: DC
  Administered 2023-10-07: 40 mg via INTRAVENOUS
  Filled 2023-10-07: qty 10

## 2023-10-07 MED ORDER — ONDANSETRON 4 MG PO TBDP: 4.0000 mg | ORAL_TABLET | Freq: Four times a day (QID) | ORAL | Status: DC | PRN

## 2023-10-07 MED ORDER — ACETAMINOPHEN 500 MG PO TABS
1000.0000 mg | ORAL_TABLET | Freq: Four times a day (QID) | ORAL | Status: DC
  Administered 2023-10-08 – 2023-10-09 (×3): 1000 mg via ORAL
  Administered 2023-10-09: 500 mg via ORAL
  Administered 2023-10-09: 1000 mg via ORAL
  Administered 2023-10-09: 500 mg via ORAL
  Administered 2023-10-09 – 2023-10-18 (×31): 1000 mg via ORAL
  Filled 2023-10-07 (×38): qty 2

## 2023-10-07 MED ORDER — METHOCARBAMOL 1000 MG/10ML IJ SOLN
500.0000 mg | Freq: Three times a day (TID) | INTRAMUSCULAR | Status: AC
Start: 2023-10-07 — End: 2023-10-10
  Administered 2023-10-07 – 2023-10-08 (×2): 500 mg via INTRAVENOUS
  Filled 2023-10-07 (×3): qty 10

## 2023-10-07 MED ORDER — IOHEXOL 350 MG/ML SOLN
75.0000 mL | Freq: Once | INTRAVENOUS | Status: AC | PRN
  Administered 2023-10-07: 75 mL via INTRAVENOUS

## 2023-10-07 MED ORDER — LORAZEPAM 2 MG/ML IJ SOLN
INTRAMUSCULAR | Status: AC
Start: 2023-10-07 — End: ?
  Filled 2023-10-07: qty 1

## 2023-10-07 MED ORDER — DIPHENHYDRAMINE HCL 50 MG/ML IJ SOLN
INTRAMUSCULAR | Status: AC
Start: 2023-10-07 — End: ?
  Filled 2023-10-07: qty 1

## 2023-10-07 MED ORDER — SODIUM CHLORIDE 0.9 % IV SOLN: INTRAVENOUS | Status: AC

## 2023-10-07 MED ORDER — DEXMEDETOMIDINE HCL IN NACL 400 MCG/100ML IV SOLN
0.0000 ug/kg/h | INTRAVENOUS | Status: DC
  Administered 2023-10-07: 0.4 ug/kg/h via INTRAVENOUS
  Filled 2023-10-07: qty 100

## 2023-10-07 MED ORDER — CLEVIDIPINE BUTYRATE 0.5 MG/ML IV EMUL
0.0000 mg/h | INTRAVENOUS | Status: DC
  Administered 2023-10-07: 2 mg/h via INTRAVENOUS
  Filled 2023-10-07: qty 100

## 2023-10-07 MED ORDER — MORPHINE SULFATE (PF) 4 MG/ML IV SOLN: 4.0000 mg | INTRAVENOUS | Status: DC | PRN

## 2023-10-07 MED ORDER — ONDANSETRON HCL 4 MG/2ML IJ SOLN
4.0000 mg | Freq: Four times a day (QID) | INTRAMUSCULAR | Status: DC | PRN
  Administered 2023-10-10 – 2023-10-17 (×7): 4 mg via INTRAVENOUS
  Filled 2023-10-07 (×7): qty 2

## 2023-10-07 MED ORDER — LORAZEPAM 2 MG/ML IJ SOLN
1.0000 mg | INTRAMUSCULAR | Status: AC | PRN
  Administered 2023-10-07 (×2): 1 mg via INTRAVENOUS
  Filled 2023-10-07: qty 1

## 2023-10-07 MED ORDER — ACETAMINOPHEN 500 MG PO TABS
1000.0000 mg | ORAL_TABLET | Freq: Four times a day (QID) | ORAL | Status: DC
Start: 2023-10-07 — End: 2023-10-07
  Filled 2023-10-07: qty 2

## 2023-10-07 MED ORDER — CIPROFLOXACIN-DEXAMETHASONE 0.3-0.1 % OT SUSP
4.0000 [drp] | Freq: Once | OTIC | Status: AC
  Administered 2023-10-07: 4 [drp] via OTIC
  Filled 2023-10-07: qty 7.5

## 2023-10-07 MED ORDER — CIPROFLOXACIN-DEXAMETHASONE 0.3-0.1 % OT SUSP
2.0000 [drp] | Freq: Two times a day (BID) | OTIC | Status: DC
  Administered 2023-10-07 – 2023-10-13 (×12): 2 [drp] via OTIC
  Filled 2023-10-07: qty 7.5
  Filled 2023-10-07: qty 0.1

## 2023-10-07 MED ORDER — LORAZEPAM 2 MG/ML IJ SOLN
0.0000 mg | Freq: Four times a day (QID) | INTRAMUSCULAR | Status: AC
  Administered 2023-10-08 – 2023-10-09 (×2): 1 mg via INTRAVENOUS
  Filled 2023-10-07 (×4): qty 1

## 2023-10-07 MED ORDER — TETANUS-DIPHTH-ACELL PERTUSSIS 5-2.5-18.5 LF-MCG/0.5 IM SUSY
0.5000 mL | PREFILLED_SYRINGE | Freq: Once | INTRAMUSCULAR | Status: AC
  Administered 2023-10-07: 0.5 mL via INTRAMUSCULAR
  Filled 2023-10-07: qty 0.5

## 2023-10-07 MED ORDER — CHLORHEXIDINE GLUCONATE CLOTH 2 % EX PADS
6.0000 | MEDICATED_PAD | Freq: Every day | CUTANEOUS | Status: DC
Start: 2023-10-07 — End: 2023-10-19
  Administered 2023-10-07 – 2023-10-19 (×12): 6 via TOPICAL

## 2023-10-07 MED ORDER — LORAZEPAM 1 MG PO TABS
1.0000 mg | ORAL_TABLET | ORAL | Status: AC | PRN
  Filled 2023-10-07: qty 1

## 2023-10-07 NOTE — ED Provider Notes (Signed)
[place] EMERGENCY DEPARTMENT AT [facility] Provider Note   CSN: [id number] Arrival date & time: 10/07/23  1309     History  Chief Complaint  Patient presents with  . Fall    Charles Dorsey is a 62 y.o. male.  62 year old male with a history of CAD on aspirin, type 2 diabetes, hyperlipidemia, hypothyroidism, and alcohol use who presents emergency department after a fall.  History obtained per EMS and the patient's family.  Reports that he was standing on a object with wheels that was approximately 3 feet tall when he slipped and fell.  Did hit his head.  Has bleeding from his left ear.  Was placed in a c-collar by EMS but is not following commands and appears to be confused.  Patient unable to give additional history.  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       Home Medications Prior to Admission medications   Medication Sig Start Date End Date Taking? Authorizing Provider  acetaminophen (TYLENOL) 500 MG tablet Take 500 mg by mouth every 6 (six) hours as needed for headache.   Yes [provider]  aspirin EC 81 MG tablet Take 1 tablet (81 mg total) by mouth daily. Swallow whole. 02/16/23  Yes Patwardhan, Manish J, MD  nitroGLYCERIN (NITROSTAT) 0.4 MG SL tablet PLACE 1 TABLET UNDER TONGUE EVERY 5 MINUTES AS NEEDED CHEST PAIN 02/16/23  Yes Patwardhan, Manish J, MD  atorvastatin (LIPITOR) 40 MG tablet Take 1 tablet (40 mg total) by mouth daily. Patient not taking: Reported on 10/07/2023 02/16/23 11/13/23  Elder Negus, MD  losartan (COZAAR) 25 MG tablet Take 1 tablet (25 mg total) by mouth daily. Patient not taking: Reported on 10/07/2023 02/16/23 02/11/24  Elder Negus, MD  metoprolol succinate (TOPROL-XL) 50 MG 24 hr tablet Take 1 tablet (50 mg total) by mouth daily. Take with or immediately following a meal. Patient not taking: Reported on  10/07/2023 02/16/23 02/11/24  Elder Negus, MD  pantoprazole (PROTONIX) 20 MG tablet Take 1 tablet (20 mg total) by mouth daily. Patient not taking: Reported on 10/07/2023 12/21/20   Mare Ferrari, PA-C      Allergies    Patient has no known allergies.    Review of Systems   Review of Systems  Physical Exam Updated Vital Signs BP 135/80   Pulse 83   Temp 98.7 F (37.1 C) (Axillary)   Resp 20   Ht 5\' 8"  (1.727 m)   Wt 61.2 kg   SpO2 96%   BMI 20.53 kg/m  Physical Exam Vitals and nursing note reviewed.  Constitutional:      General: He is in acute distress.     Appearance: He is well-developed. He is ill-appearing.     Comments: Agitated trying to sit up and get out of stretcher and not cooperating with exam.  Patient airway intact and he is able to state his name and that the c-collar is uncomfortable.   HENT:     Head: Normocephalic.     Right Ear: Ear canal and external ear normal.     Left Ear: External ear normal.     Ears:     Comments: Blood coming from left ear    Nose: Nose normal.  Eyes:     Extraocular Movements: Extraocular movements intact.     Conjunctiva/sclera: Conjunctivae normal.  Pupils: Pupils are equal, round, and reactive to light.     Comments: Pupils 3 mm bilaterally  Neck:     Comments: C-collar in place Cardiovascular:     Rate and Rhythm: Normal rate and regular rhythm.     Heart sounds: Normal heart sounds.     Comments: Radial DP pulses 2+ bilaterally Pulmonary:     Effort: Pulmonary effort is normal. No respiratory distress.     Breath sounds: Normal breath sounds.  Abdominal:     General: There is no distension.     Palpations: Abdomen is soft. There is no mass.     Tenderness: There is no abdominal tenderness. There is no guarding.  Musculoskeletal:     Right lower leg: No edema.     Left lower leg: No edema.     Comments: No midline T-spine or L-spine step-offs.  Patient not cooperating enough to see if he is tender to  palpation in these regions.  Good rectal tone.  Skin:    General: Skin is warm and dry.  Neurological:     Mental Status: He is alert.     Comments: GCS of 14 for confusion.  Moving all 4 extremities.  Not cooperating with neurologic exam otherwise.   Psychiatric:        Mood and Affect: Mood normal.        Behavior: Behavior normal.     ED Results / Procedures / Treatments   Labs (all labs ordered are listed, but only abnormal results are displayed) Labs Reviewed  COMPREHENSIVE METABOLIC PANEL - Abnormal; Notable for the following components:      Result Value   CO2 20 (*)    Glucose, Bld 238 (*)    Calcium 8.8 (*)    AST 78 (*)    ALT 56 (*)    Alkaline Phosphatase 134 (*)    All other components within normal limits  ETHANOL - Abnormal; Notable for the following components:   Alcohol, Ethyl (B) 62 (*)    All other components within normal limits  I-STAT CHEM 8, ED - Abnormal; Notable for the following components:   Glucose, Bld 239 (*)    Calcium, Ion 1.00 (*)    TCO2 21 (*)    All other components within normal limits  I-STAT CG4 LACTIC ACID, ED - Abnormal; Notable for the following components:   Lactic Acid, Venous 3.4 (*)    All other components within normal limits  CBG MONITORING, ED - Abnormal; Notable for the following components:   Glucose-Capillary 238 (*)    All other components within normal limits  MRSA NEXT GEN BY PCR, NASAL  CBC  PROTIME-INR  URINALYSIS, ROUTINE W REFLEX MICROSCOPIC  RAPID URINE DRUG SCREEN, HOSP PERFORMED  CBC  BASIC METABOLIC PANEL  HIV ANTIBODY (ROUTINE TESTING W REFLEX)  HEMOGLOBIN A1C  SAMPLE TO BLOOD BANK    EKG None  Radiology CT Temporal Bones Wo Contrast  Result Date: 10/07/2023 CLINICAL DATA:  L ear bleeding and trauma EXAM: CT TEMPORAL BONES WITHOUT CONTRAST TECHNIQUE: Axial and coronal plane CT imaging of the petrous temporal bones was performed with thin-collimation image reconstruction. No intravenous contrast  was administered. Multiplanar CT image reconstructions were also generated. RADIATION DOSE REDUCTION: This exam was performed according to the departmental dose-optimization program which includes automated exposure control, adjustment of the mA and/or kV according to patient size and/or use of iterative reconstruction technique. COMPARISON:  None Available. FINDINGS: RIGHT TEMPORAL BONE External auditory canal:  Normal. Middle ear cavity: Normally aerated. The scutum and ossicles are normal. The tegmen tympani is intact. Inner ear structures: The cochlea, vestibule and semicircular canals are normal. The vestibular aqueduct is not enlarged. Internal auditory and facial nerve canals:  Normal Mastoid air cells: Normally aerated. No osseous erosion. LEFT TEMPORAL BONE There is a longitudinal fracture through the left temporal bone that extends into the left TMJ. The fracture extends through the scutum (series 15, image 110), but without evidence of ossicular disruption inner ear structures are spared. There are blood products surrounding the ossicles. External auditory canal: There are layering blood products in the left external auditory canal Middle ear cavity: There are blood products in the left middle ear cavity surrounding the ossicles. Ossicles are intact. Fracture extends through the tegmen tympani and tegmen mastoideum. Inner ear structures: The cochlea, vestibule and semicircular canals are normal. The vestibular aqueduct is not enlarged. Internal auditory and facial nerve canals:  Normal. Mastoid air cells: There are blood products in the left mastoid air cells. Vascular: Normal non-contrast appearance of the carotid canals, jugular bulbs and sigmoid plates. Limited intracranial:  See separately dictated CT head Visible orbits/paranasal sinuses: No acute or significant finding. Soft tissues: Normal. IMPRESSION: 1. Longitudinal fracture through the left temporal bone that extends into the left TMJ. The  fracture extends through the scutum, tegmen tympani, and tegmen mastoideum. There is hemotympanum without evidence of ossicular disruption or inner ear involvement. 2. Superiorly the fracture extends across into the left parietal bone. Findings were discussed with Dr. Jarold Motto on 10/07/23 at 3:00 PM. Findings on same day CT head and CT cervical spine were also discussed at this time. Electronically Signed   By: Lorenza Cambridge M.D.   On: 10/07/2023 15:01   CT CHEST ABDOMEN PELVIS W CONTRAST  Result Date: 10/07/2023 CLINICAL DATA:  Patient was on a cart that was about 3 feet tall and fell from a height of about 6p. Confusion with altered mental status. Bleeding from left ear. EXAM: CT CHEST, ABDOMEN, AND PELVIS WITH CONTRAST TECHNIQUE: Multidetector CT imaging of the chest, abdomen and pelvis was performed following the standard protocol during bolus administration of intravenous contrast. RADIATION DOSE REDUCTION: This exam was performed according to the departmental dose-optimization program which includes automated exposure control, adjustment of the mA and/or kV according to patient size and/or use of iterative reconstruction technique. CONTRAST:  75mL OMNIPAQUE IOHEXOL 350 MG/ML SOLN COMPARISON:  CT AP 12/21/20 FINDINGS: CT CHEST FINDINGS Cardiovascular: Heart size is normal. Aortic atherosclerosis and coronary artery calcifications. No pericardial effusion. Thoracic aorta appears intact. Mediastinum/Nodes: Enlarged and heterogeneous thyroid gland is identified with dominant nodule in the left lobe measuring 2.9 cm The trachea appears patent and midline. Normal appearance of the esophagus. No mediastinal or hilar adenopathy. Lungs/Pleura: No pleural effusion. No airspace consolidation, atelectasis, or pneumothorax. Dependent change with ground-glass attenuation within the posterior lower lobes. Small scattered lung nodules are identified, including -5 mm left lower lobe nodule, image 108/5. -4 mm subpleural  nodule in the posterior left base, image 101/5 -5 mm right middle lobe lung nodule., image 81/5. Musculoskeletal: No acute fracture identified thoracic vertebral body heights are well preserved. No rib fractures identified. CT ABDOMEN PELVIS FINDINGS Hepatobiliary: No focal liver abnormality is seen. No gallstones, gallbladder wall thickening, or biliary dilatation. Pancreas: Unremarkable. No pancreatic ductal dilatation or surrounding inflammatory changes. Spleen: Normal in size without focal abnormality. Adrenals/Urinary Tract: No adrenal hemorrhage or renal injury identified. Punctate left renal calculus. Benign Bosniak class  1 cyst arises off the upper pole of right kidney measuring 1.8 cm. No follow-up imaging recommended. Bladder is normal. Bladder is unremarkable. Stomach/Bowel: Stomach is within normal limits. Small bowel loops are normal in course and caliber without evidence for bowel wall thickening, inflammation or distension. The appendix is visualized and appears normal. Decompressed colon with diffuse mucosal enhancement, and mild wall thickening with hyperemia of the Vasa recta. Intramural fatty deposition is identified involving the ascending colon and proximal transverse colon. No surrounding inflammatory fat stranding or free fluid identified. Sparing of the rectum. Vascular/Lymphatic: Aortic atherosclerosis. The abdominal vascularity is patent. No signs of abdominopelvic adenopathy. Reproductive: Prostate is unremarkable. Other: Small fat containing inguinal hernias are noted bilaterally. There is no free fluid identified. No signs of pneumoperitoneum. Musculoskeletal: No acute or significant osseous findings. IMPRESSION: 1. No acute traumatic injury identified within the chest, abdomen or pelvis. 2. Decompressed colon with diffuse mucosal enhancement, mild wall thickening with hyperemia of the Vasa recta. Intramural fatty deposition is identified involving the ascending colon and proximal  transverse colon. No surrounding inflammatory fat stranding or free fluid identified. Findings are favored to represent a nonspecific infectious or inflammatory colitis. 3. Enlarged and heterogeneous thyroid gland with dominant nodule in the left lobe measuring 2.9 cm. This has been evaluated on previous imaging. (ref: J Am Coll Radiol. 2015 Feb;12(2): 143-50). 4. Coronary artery calcifications. 5. Punctate left renal calculus. 6. Multiple pulmonary nodules. Most significant: Left solid pulmonary nodule measuring 5 mm. Per Fleischner Society Guidelines, no routine follow-up imaging is recommended. These guidelines do not apply to immunocompromised patients and patients with cancer. Follow up in patients with significant comorbidities as clinically warranted. For lung cancer screening, adhere to Lung-RADS guidelines. Reference: Radiology. 2017; 284(1):228-43. 7.  Aortic Atherosclerosis (ICD10-I70.0). Electronically Signed   By: Signa Kell M.D.   On: 10/07/2023 14:48   CT HEAD WO CONTRAST  Result Date: 10/07/2023 CLINICAL DATA:  Head trauma, moderate-severe; Polytrauma, blunt EXAM: CT HEAD WITHOUT CONTRAST CT CERVICAL SPINE WITHOUT CONTRAST TECHNIQUE: Multidetector CT imaging of the head and cervical spine was performed following the standard protocol without intravenous contrast. Multiplanar CT image reconstructions of the cervical spine were also generated. RADIATION DOSE REDUCTION: This exam was performed according to the departmental dose-optimization program which includes automated exposure control, adjustment of the mA and/or kV according to patient size and/or use of iterative reconstruction technique. COMPARISON:  None Available. FINDINGS: CT HEAD FINDINGS Brain: There is subarachnoid hemorrhage along the right cerebral convexity. There is also a or mm subdural hematoma along the right cerebral convexity. Subdural blood products extend along the right tentorial leaflet in the posterior falx no  mastoid. No hydrocephalus. No evidence of intraventricular extension. No CT evidence of an acute cortical infarct. Vascular: No hyperdense vessel or unexpected calcification. Skull: There is a longitudinal fracture of the left temporal bone with blood products in the left-sided mastoid air cells and in the middle ear. Fracture extends into the left parietal bone. See separately dictated temporal bone CT for better characterization. Sinuses/Orbits: Pansinus mucosal thickening. Orbits are unremarkable. Other: None. CT CERVICAL SPINE FINDINGS Alignment: Normal. Skull base and vertebrae: No acute fracture. No primary bone lesion or focal pathologic process. Soft tissues and spinal canal: No prevertebral fluid or swelling. No visible canal hematoma. Disc levels: There is an eccentric right disc bulge at C5-C6 with moderate spinal canal narrowing. Upper chest: Negative. Other: The thyroid is enlarged with a dominant thyroid nodule measuring up to 2.7 cm. This was  likely previously biopsied. Recommend correlation with prior biopsy results. IMPRESSION: 1. Subarachnoid hemorrhage along the right cerebral convexity. No evidence of intraventricular extension. No hydrocephalus. 2. Subdural hematoma along the right cerebral convexity measuring up to 0.4 cm. Subdural blood products extend along the right tentorial leaflet and posterior falx. No significant mass effect. 3. Longitudinal fracture of the left temporal bone with blood products in the left-sided mastoid air cells and middle ear. Fracture extends into the left parietal bone. See separately dictated temporal bone CT for better characterization. 4. No acute fracture or traumatic listhesis of the cervical spine. 5. Eccentric right disc bulge at C5-C6 with moderate spinal canal narrowing. Electronically Signed   By: Lorenza Cambridge M.D.   On: 10/07/2023 14:47   CT CERVICAL SPINE WO CONTRAST  Result Date: 10/07/2023 CLINICAL DATA:  Head trauma, moderate-severe;  Polytrauma, blunt EXAM: CT HEAD WITHOUT CONTRAST CT CERVICAL SPINE WITHOUT CONTRAST TECHNIQUE: Multidetector CT imaging of the head and cervical spine was performed following the standard protocol without intravenous contrast. Multiplanar CT image reconstructions of the cervical spine were also generated. RADIATION DOSE REDUCTION: This exam was performed according to the departmental dose-optimization program which includes automated exposure control, adjustment of the mA and/or kV according to patient size and/or use of iterative reconstruction technique. COMPARISON:  None Available. FINDINGS: CT HEAD FINDINGS Brain: There is subarachnoid hemorrhage along the right cerebral convexity. There is also a or mm subdural hematoma along the right cerebral convexity. Subdural blood products extend along the right tentorial leaflet in the posterior falx no mastoid. No hydrocephalus. No evidence of intraventricular extension. No CT evidence of an acute cortical infarct. Vascular: No hyperdense vessel or unexpected calcification. Skull: There is a longitudinal fracture of the left temporal bone with blood products in the left-sided mastoid air cells and in the middle ear. Fracture extends into the left parietal bone. See separately dictated temporal bone CT for better characterization. Sinuses/Orbits: Pansinus mucosal thickening. Orbits are unremarkable. Other: None. CT CERVICAL SPINE FINDINGS Alignment: Normal. Skull base and vertebrae: No acute fracture. No primary bone lesion or focal pathologic process. Soft tissues and spinal canal: No prevertebral fluid or swelling. No visible canal hematoma. Disc levels: There is an eccentric right disc bulge at C5-C6 with moderate spinal canal narrowing. Upper chest: Negative. Other: The thyroid is enlarged with a dominant thyroid nodule measuring up to 2.7 cm. This was likely previously biopsied. Recommend correlation with prior biopsy results. IMPRESSION: 1. Subarachnoid hemorrhage  along the right cerebral convexity. No evidence of intraventricular extension. No hydrocephalus. 2. Subdural hematoma along the right cerebral convexity measuring up to 0.4 cm. Subdural blood products extend along the right tentorial leaflet and posterior falx. No significant mass effect. 3. Longitudinal fracture of the left temporal bone with blood products in the left-sided mastoid air cells and middle ear. Fracture extends into the left parietal bone. See separately dictated temporal bone CT for better characterization. 4. No acute fracture or traumatic listhesis of the cervical spine. 5. Eccentric right disc bulge at C5-C6 with moderate spinal canal narrowing. Electronically Signed   By: Lorenza Cambridge M.D.   On: 10/07/2023 14:47   DG Chest Port 1 View  Result Date: 10/07/2023 CLINICAL DATA:  Trauma fall EXAM: PORTABLE CHEST 1 VIEW COMPARISON:  None Available. FINDINGS: The heart size and mediastinal contours are within normal limits. Both lungs are clear. The visualized skeletal structures are unremarkable. IMPRESSION: No acute abnormality of the lungs in AP portable projection. Electronically Signed   By:  Jearld Lesch M.D.   On: 10/07/2023 14:02   DG Pelvis Portable  Result Date: 10/07/2023 CLINICAL DATA:  Fall. EXAM: PORTABLE PELVIS 1-2 VIEWS COMPARISON:  None Available. FINDINGS: No acute fracture or dislocation.  Sacroiliac joints are symmetric. IMPRESSION: No acute osseous abnormality. Electronically Signed   By: Jeronimo Greaves M.D.   On: 10/07/2023 14:02    Procedures Procedures    Medications Ordered in ED Medications  labetalol (NORMODYNE) injection 20 mg (20 mg Intravenous Given 10/07/23 1400)    And  clevidipine (CLEVIPREX) infusion 0.5 mg/mL (0 mg/hr Intravenous Stopped 10/07/23 1801)  acetaminophen (TYLENOL) tablet 1,000 mg (1,000 mg Oral Not Given 10/07/23 1813)  methocarbamol (ROBAXIN) tablet 500 mg ( Oral See Alternative 10/07/23 1941)    Or  methocarbamol (ROBAXIN)  injection 500 mg (500 mg Intravenous Given 10/07/23 1941)  docusate sodium (COLACE) capsule 100 mg (has no administration in time range)  polyethylene glycol (MIRALAX / GLYCOLAX) packet 17 g (has no administration in time range)  ondansetron (ZOFRAN-ODT) disintegrating tablet 4 mg (has no administration in time range)    Or  ondansetron (ZOFRAN) injection 4 mg (has no administration in time range)  metoprolol tartrate (LOPRESSOR) injection 5 mg (has no administration in time range)  hydrALAZINE (APRESOLINE) injection 10 mg (has no administration in time range)  0.9 %  sodium chloride infusion ( Intravenous Infusion Verify 10/07/23 2000)  morphine (PF) 2 MG/ML injection 2 mg (has no administration in time range)  morphine (PF) 4 MG/ML injection 4 mg (has no administration in time range)  pantoprazole (PROTONIX) EC tablet 40 mg ( Oral See Alternative 10/07/23 1926)    Or  pantoprazole (PROTONIX) injection 40 mg (40 mg Intravenous Given 10/07/23 1926)  LORazepam (ATIVAN) tablet 1-4 mg ( Oral See Alternative 10/07/23 2026)    Or  LORazepam (ATIVAN) injection 1-4 mg (1 mg Intravenous Given 10/07/23 2026)  LORazepam (ATIVAN) injection 0-4 mg ( Intravenous Not Given 10/07/23 1927)    Followed by  LORazepam (ATIVAN) injection 0-4 mg (has no administration in time range)  dexmedetomidine (PRECEDEX) 400 MCG/100ML (4 mcg/mL) infusion (0.4 mcg/kg/hr  61.2 kg Intravenous Infusion Verify 10/07/23 1900)  insulin aspart (novoLOG) injection 0-9 Units (has no administration in time range)  ciprofloxacin-dexamethasone (CIPRODEX) 0.3-0.1 % OTIC (EAR) suspension 2 drop (has no administration in time range)  dexmedetomidine (PRECEDEX) 400 MCG/100ML (4 mcg/mL) infusion (0.4 mcg/kg/hr  61.2 kg Intravenous Infusion Verify 10/07/23 2000)  Chlorhexidine Gluconate Cloth 2 % PADS 6 each (6 each Topical Given 10/07/23 1813)  haloperidol lactate (HALDOL) injection 2.5 mg (2.5 mg Intravenous Given 10/07/23 1320)  Tdap  (BOOSTRIX) injection 0.5 mL (0.5 mLs Intramuscular Given 10/07/23 1409)  haloperidol lactate (HALDOL) injection 2.5 mg (2.5 mg Intravenous Given 10/07/23 1316)  LORazepam (ATIVAN) injection 2 mg (2 mg Intravenous Given 10/07/23 1345)  diphenhydrAMINE (BENADRYL) injection 50 mg (50 mg Intravenous Given 10/07/23 1340)  iohexol (OMNIPAQUE) 350 MG/ML injection 75 mL (75 mLs Intravenous Contrast Given 10/07/23 1401)  ciprofloxacin-dexamethasone (CIPRODEX) 0.3-0.1 % OTIC (EAR) suspension 4 drop (4 drops Left EAR Given 10/07/23 1528)    ED Course/ Medical Decision Making/ A&P Clinical Course as of 10/07/23 2042  Sun Oct 07, 2023  1420 Dr Franky Macho will see the patient.  Does not recommend any strict blood pressure goals or Keppra at this time.  Does not need any medications due to the fact that he is on aspirin. [RP]  1507 Dr Elijah Birk consulted for temporal bone fracture.  Did reevaluate the patient  and think that his face may be moving slightly less on the left side and did communicate this to Dr. Elijah Birk but his exam is severely limited because he is not following commands. Recommends starting ciprodex drops.  [RP]  1602 Dr Janee Morn from trauma surgery to admit the patient. [RP]    Clinical Course User Index [RP] Rondel Baton, MD                                 Medical Decision Making Amount and/or Complexity of Data Reviewed Labs: ordered. Radiology: ordered.  Risk Prescription drug management. Decision regarding hospitalization.   Charles Dorsey is a 62 y.o. male with comorbidities that complicate the patient evaluation including diabetes, alcohol use, and CAD on aspirin who presents to the emergency department with confusion and head trauma after a fall  Initial Ddx:  TBI, concussion, C-spine injury, temporal bone fracture, facial nerve paralysis, intoxication, traumatic thoracic/intra-abdominal injury  MDM/Course:  Patient presents to the emergency department after a fall from a  3 foot object.  Does have bleeding from his left ear.  No other obvious signs of trauma.  Was very agitated on arrival and required Haldol followed by Ativan and Benadryl to facilitate his workup.  Did also require physical restraints.  He had a head CT which shows subdural hematoma and subarachnoid hemorrhage without significant mass effect or intraventricular extension.  Neurosurgery was consulted who will evaluate the patient.  Does not recommend Keppra or any reversal medications at this time.  ENT also consulted for the temporal bone fracture and seems the patient may be moving his left face slightly left though this is a severely limited exam due to has not occasion.  Patient will be admitted to trauma surgery.  Neurosurgery recommends repeat CT scan in the morning.  Patient did become agitated and eventually required Precedex drip to keep him calm.  This patient presents to the ED for concern of complaints listed in HPI, this involves an extensive number of treatment options, and is a complaint that carries with it a high risk of complications and morbidity. Disposition including potential need for admission considered.   Dispo: ICU  Additional history obtained from spouse Records reviewed Outpatient Clinic Notes The following labs were independently interpreted: Chemistry and show  mildly elevated LFTs likely due to alcohol use I independently reviewed the following imaging with scope of interpretation limited to determining acute life threatening conditions related to emergency care: CT Head and agree with the radiologist interpretation with the following exceptions: none I personally reviewed and interpreted cardiac monitoring: normal sinus rhythm  I personally reviewed and interpreted the pt's EKG: see above for interpretation  I have reviewed the patients home medications and made adjustments as needed Consults: Neurosurgery, Trauma Surgery, and ENT Social Determinants of health:  Alcohol  use  Portions of this note were generated with Dragon dictation software. Dictation errors may occur despite best attempts at proofreading.    CRITICAL CARE Performed by: Rondel Baton   Total critical care time: 60 minutes  Critical care time was exclusive of separately billable procedures and treating other patients.  Critical care was necessary to treat or prevent imminent or life-threatening deterioration.  Critical care was time spent personally by me on the following activities: development of treatment plan with patient and/or surrogate as well as nursing, discussions with consultants, evaluation of patient's response to treatment, examination of patient, obtaining history  from patient or surrogate, ordering and performing treatments and interventions, ordering and review of laboratory studies, ordering and review of radiographic studies, pulse oximetry and re-evaluation of patient's condition.   Final Clinical Impression(s) / ED Diagnoses Final diagnoses:  Fall, initial encounter  Subarachnoid hemorrhage (HCC)  Subdural hematoma (HCC)  Agitation  Alcohol use  Closed fracture of temporal bone, initial encounter [facility])    Rx / DC Orders ED Discharge Orders     None         Rondel Baton, MD 10/07/23 2041

## 2023-10-07 NOTE — ED Notes (Signed)
Pt urinated on himself and had liquidy BM. Pt cleaned. Full linen change. Pt resting in bed comfortably.

## 2023-10-07 NOTE — ED Notes (Signed)
Attempted to call 4N for report. Unable to take it at this time.

## 2023-10-07 NOTE — ED Notes (Signed)
RN Aware of elevated BP

## 2023-10-07 NOTE — TOC CAGE-AID Note (Signed)
Transition of Care [facility]) - CAGE-AID Screening   Patient Details  Name: Charles Dorsey MRN: [id number] Date of Birth: November 05, 1961   Hewitt Shorts, RN Trauma Response Nurse Phone Number: [phone] 10/07/2023, 3:45 PM    CAGE-AID Screening: Substance Abuse Screening unable to be completed due to: : Patient unable to participate (confused - has TBI-- hx of ETOH use -- will need resources on discharge)

## 2023-10-07 NOTE — ED Notes (Signed)
Trauma Response Nurse Documentation   Charles Dorsey is a 62 y.o. male arriving to [facility] ED via [facility]  On aspirin 81 mg daily. Trauma was activated as a Level 2 by charge RN based on the following trauma criteria GCS 10-14 associated with trauma or AVPU < A.  Patient cleared for CT by Dr. Eloise Harman . Pt transported to CT with trauma response nurse and primary RN present to monitor. RN remained with the patient throughout their absence from the department for clinical observation.   GCS E-3/ V- 3/ M - 5  -- 11  History   Past Medical History:  Diagnosis Date   Acid reflux    Diabetes mellitus without complication (HCC)    Hyperlipidemia    Hypothyroidism      Past Surgical History:  Procedure Laterality Date   CORONARY STENT INTERVENTION N/A 03/18/2020   Procedure: CORONARY STENT INTERVENTION;  Surgeon: Elder Negus, MD;  Location: [facility] INVASIVE CV LAB;  Service: Cardiovascular;  Laterality: N/A;   CORONARY ULTRASOUND/IVUS N/A 03/18/2020   Procedure: Intravascular Ultrasound/IVUS;  Surgeon: Elder Negus, MD;  Location: [facility] INVASIVE CV LAB;  Service: Cardiovascular;  Laterality: N/A;   LEFT HEART CATH AND CORONARY ANGIOGRAPHY N/A 03/18/2020   Procedure: LEFT HEART CATH AND CORONARY ANGIOGRAPHY;  Surgeon: Elder Negus, MD;  Location: [facility] INVASIVE CV LAB;  Service: Cardiovascular;  Laterality: N/A;       Initial Focused Assessment (If applicable, or please see trauma documentation):  Airway - clear Breathing - unlabored Circulation - 2+ pulses peripherally- Bleeding noted form left ear.  GCS - 11   CT's Completed:   CT Head, CT Maxillofacial, CT C-Spine, CT Chest w/ contrast, and CT abdomen/pelvis w/ contrast   Interventions:  Labs Xrays CT scans Admit  Plan for disposition:  Admission to ICU   Consults completed:  Neurosurgeon at 1600 -- Dr. Franky Macho ENT - Elijah Birk at [phone]  Event Summary:  Charles Dorsey off an approx 55ft ladder per EMS, hit  head on concrete. On arrival, pt was uncooperative, unable to follow commands, GCS 11- Pupils - 2 equal and reactive,  Etoh odor on breath, hx of daily use-- CIWA ordered.   Is able to MAE x 4, will occasionally follow commands when asked to move legs.  Transported to CT with C-collar on, C-spine precautions maintained with all transfers.    [name]  Trauma Response RN  Please call TRN at [phone] for further assistance.

## 2023-10-07 NOTE — H&P (Signed)
Charles MCMANIGAL is an 62 y.o. male.   Chief Complaint: fall HPI: 62yo M with past history as below as well as daily alcohol intake was standing on a cart and staining a deck when he fell down striking his head.  He was brought in by EMS and evaluated as a level 2 trauma.  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  I was asked to see him for admission.  His family is at the bedside and assists with the history.  Past Medical History:  Diagnosis Date   Acid reflux    Diabetes mellitus without complication (HCC)    Hyperlipidemia    Hypothyroidism     Past Surgical History:  Procedure Laterality Date   CORONARY STENT INTERVENTION N/A 03/18/2020   Procedure: CORONARY STENT INTERVENTION;  Surgeon: Elder Negus, MD;  Location: [facility];  Service: Cardiovascular;  Laterality: N/A;   CORONARY ULTRASOUND/IVUS N/A 03/18/2020   Procedure: Intravascular Ultrasound/IVUS;  Surgeon: Elder Negus, MD;  Location: [facility];  Service: Cardiovascular;  Laterality: N/A;   LEFT HEART CATH AND CORONARY ANGIOGRAPHY N/A 03/18/2020   Procedure: LEFT HEART CATH AND CORONARY ANGIOGRAPHY;  Surgeon: Elder Negus, MD;  Location: [facility];  Service: Cardiovascular;  Laterality: N/A;    Family History  Problem Relation Age of Onset   Heart disease Father    Diabetes Father    Hyperlipidemia Father    Social History:  reports that he has been smoking cigarettes. He has never used smokeless tobacco. He reports current alcohol use. He reports that he does not use drugs.  Allergies: No Known Allergies  (Not in a hospital admission)   Results for orders placed or performed during the hospital encounter of 10/07/23 (from the past 48 hour(s))  Comprehensive metabolic panel     Status: Abnormal   Collection Time: 10/07/23  1:15 PM  Result Value Ref Range   Sodium 136 135 - 145 mmol/L    Potassium 3.6 3.5 - 5.1 mmol/L    Comment: HEMOLYSIS AT THIS LEVEL MAY AFFECT RESULT   Chloride 105 98 - 111 mmol/L   CO2 20 (L) 22 - 32 mmol/L   Glucose, Bld 238 (H) 70 - 99 mg/dL    Comment: Glucose reference range applies only to samples taken after fasting for at least 8 hours.   BUN 16 6 - 20 mg/dL   Creatinine, Ser 1.61 0.61 - 1.24 mg/dL   Calcium 8.8 (L) 8.9 - 10.3 mg/dL   Total Protein 6.8 6.5 - 8.1 g/dL   Albumin 3.8 3.5 - 5.0 g/dL   AST 78 (H) 15 - 41 U/L    Comment: HEMOLYSIS AT THIS LEVEL MAY AFFECT RESULT   ALT 56 (H) 0 - 44 U/L    Comment: HEMOLYSIS AT THIS LEVEL MAY AFFECT RESULT   Alkaline Phosphatase 134 (H) 38 - 126 U/L   Total Bilirubin 1.0 <1.2 mg/dL    Comment: HEMOLYSIS AT THIS LEVEL MAY AFFECT RESULT   GFR, Estimated >60 >60 mL/min    Comment: (NOTE) Calculated using the CKD-EPI Creatinine Equation (2021)    Anion gap 11 5 - 15    Comment: Performed at [facility], [address] [place], [place] [address]  CBC     Status: None   Collection Time: 10/07/23  1:15 PM  Result Value Ref Range   WBC 8.0 4.0 -  10.5 K/uL   RBC 4.80 4.22 - 5.81 MIL/uL   Hemoglobin 15.2 13.0 - 17.0 g/dL   HCT 62.1 30.8 - 65.7 %   MCV 90.0 80.0 - 100.0 fL   MCH 31.7 26.0 - 34.0 pg   MCHC 35.2 30.0 - 36.0 g/dL   RDW 84.6 96.2 - 95.2 %   Platelets 228 150 - 400 K/uL   nRBC 0.0 0.0 - 0.2 %    Comment: Performed at [facility], [address], [place], [place] [address]  Ethanol     Status: Abnormal   Collection Time: 10/07/23  1:15 PM  Result Value Ref Range   Alcohol, Ethyl (B) 62 (H) <10 mg/dL    Comment: (NOTE) Lowest detectable limit for serum alcohol is 10 mg/dL.  For medical purposes only. Performed at [facility], [address] [place], [place] [address]   Protime-INR     Status: None   Collection Time: 10/07/23  1:15 PM  Result Value Ref Range   Prothrombin Time 12.7 11.4 - 15.2 seconds   INR 0.9 0.8 - 1.2    Comment: (NOTE) INR goal  varies based on device and disease states. Performed at [facility], [address] [place], [place] [address]   Sample to Blood Bank     Status: None   Collection Time: 10/07/23  1:15 PM  Result Value Ref Range   Blood Bank Specimen SAMPLE AVAILABLE FOR TESTING    Sample Expiration      10/10/2023,2359 Performed at [facility], [address], [place], [place] [address]   CBG monitoring, ED     Status: Abnormal   Collection Time: 10/07/23  1:18 PM  Result Value Ref Range   Glucose-Capillary 238 (H) 70 - 99 mg/dL    Comment: Glucose reference range applies only to samples taken after fasting for at least 8 hours.  I-Stat Chem 8, ED     Status: Abnormal   Collection Time: 10/07/23  1:23 PM  Result Value Ref Range   Sodium 139 135 - 145 mmol/L   Potassium 3.5 3.5 - 5.1 mmol/L   Chloride 105 98 - 111 mmol/L   BUN 20 6 - 20 mg/dL   Creatinine, Ser 3.47 0.61 - 1.24 mg/dL   Glucose, Bld 425 (H) 70 - 99 mg/dL    Comment: Glucose reference range applies only to samples taken after fasting for at least 8 hours.   Calcium, Ion 1.00 (L) 1.15 - 1.40 mmol/L   TCO2 21 (L) 22 - 32 mmol/L   Hemoglobin 15.6 13.0 - 17.0 g/dL   HCT 95.6 38.7 - 56.4 %  I-Stat Lactic Acid, ED     Status: Abnormal   Collection Time: 10/07/23  1:24 PM  Result Value Ref Range   Lactic Acid, Venous 3.4 (HH) 0.5 - 1.9 mmol/L   Comment NOTIFIED PHYSICIAN    CT Temporal Bones Wo Contrast  Result Date: 10/07/2023 CLINICAL DATA:  L ear bleeding and trauma EXAM: CT TEMPORAL BONES WITHOUT CONTRAST TECHNIQUE: Axial and coronal plane CT imaging of the petrous temporal bones was performed with thin-collimation image reconstruction. No intravenous contrast was administered. Multiplanar CT image reconstructions were also generated. RADIATION DOSE REDUCTION: This exam was performed according to the departmental dose-optimization program which includes automated exposure control, adjustment of the mA and/or kV  according to patient size and/or use of iterative reconstruction technique. COMPARISON:  None Available. FINDINGS: RIGHT TEMPORAL BONE External auditory canal:  Normal. Middle ear cavity: Normally aerated. The scutum and ossicles are normal. The tegmen tympani is intact. Inner ear structures: The cochlea, vestibule and semicircular canals are normal. The vestibular aqueduct is not enlarged. Internal auditory and facial nerve canals:  Normal Mastoid air cells: Normally aerated. No osseous erosion. LEFT TEMPORAL BONE There is a longitudinal fracture through the left temporal bone that extends into the left TMJ. The fracture extends through the scutum (series 15, image 110), but without evidence of ossicular disruption inner ear structures are spared. There are blood products surrounding the ossicles. External auditory canal: There are layering blood products in the left external auditory canal Middle ear cavity: There are blood products in the left middle ear cavity surrounding the ossicles. Ossicles are intact. Fracture extends through the tegmen tympani and tegmen mastoideum. Inner ear structures: The cochlea, vestibule and semicircular canals are normal. The vestibular aqueduct is not enlarged. Internal auditory and facial nerve canals:  Normal. Mastoid air cells: There are blood products in the left mastoid air cells. Vascular: Normal non-contrast appearance of the carotid canals, jugular bulbs and sigmoid plates. Limited intracranial:  See separately dictated CT head Visible orbits/paranasal sinuses: No acute or significant finding. Soft tissues: Normal. IMPRESSION: 1. Longitudinal fracture through the left temporal bone that extends into the left TMJ. The fracture extends through the scutum, tegmen tympani, and tegmen mastoideum. There is hemotympanum without evidence of ossicular disruption or inner ear involvement. 2. Superiorly the fracture extends across into the left parietal bone. Findings were discussed  with Dr. Jarold Motto on 10/07/23 at 3:00 PM. Findings on same day CT head and CT cervical spine were also discussed at this time. Electronically Signed   By: Lorenza Cambridge M.D.   On: 10/07/2023 15:01   CT CHEST ABDOMEN PELVIS W CONTRAST  Result Date: 10/07/2023 CLINICAL DATA:  Patient was on a cart that was about 3 feet tall and fell from a height of about 6p. Confusion with altered mental status. Bleeding from left ear. EXAM: CT CHEST, ABDOMEN, AND PELVIS WITH CONTRAST TECHNIQUE: Multidetector CT imaging of the chest, abdomen and pelvis was performed following the standard protocol during bolus administration of intravenous contrast. RADIATION DOSE REDUCTION: This exam was performed according to the departmental dose-optimization program which includes automated exposure control, adjustment of the mA and/or kV according to patient size and/or use of iterative reconstruction technique. CONTRAST:  75mL OMNIPAQUE IOHEXOL 350 MG/ML SOLN COMPARISON:  CT AP 12/21/20 FINDINGS: CT CHEST FINDINGS Cardiovascular: Heart size is normal. Aortic atherosclerosis and coronary artery calcifications. No pericardial effusion. Thoracic aorta appears intact. Mediastinum/Nodes: Enlarged and heterogeneous thyroid gland is identified with dominant nodule in the left lobe measuring 2.9 cm The trachea appears patent and midline. Normal appearance of the esophagus. No mediastinal or hilar adenopathy. Lungs/Pleura: No pleural effusion. No airspace consolidation, atelectasis, or pneumothorax. Dependent change with ground-glass attenuation within the posterior lower lobes. Small scattered lung nodules are identified, including -5 mm left lower lobe nodule, image 108/5. -4 mm subpleural nodule in the posterior left base, image 101/5 -5 mm right middle lobe lung nodule., image 81/5. Musculoskeletal: No acute fracture identified thoracic vertebral body heights are well preserved. No rib fractures identified. CT ABDOMEN PELVIS FINDINGS  Hepatobiliary: No focal liver abnormality is seen. No gallstones, gallbladder wall thickening, or biliary dilatation. Pancreas: Unremarkable. No pancreatic ductal dilatation or surrounding inflammatory changes. Spleen: Normal in size without focal abnormality. Adrenals/Urinary Tract: No adrenal hemorrhage or renal injury identified. Punctate left renal calculus. Benign Bosniak class  1 cyst arises off the upper pole of right kidney measuring 1.8 cm. No follow-up imaging recommended. Bladder is normal. Bladder is unremarkable. Stomach/Bowel: Stomach is within normal limits. Small bowel loops are normal in course and caliber without evidence for bowel wall thickening, inflammation or distension. The appendix is visualized and appears normal. Decompressed colon with diffuse mucosal enhancement, and mild wall thickening with hyperemia of the Vasa recta. Intramural fatty deposition is identified involving the ascending colon and proximal transverse colon. No surrounding inflammatory fat stranding or free fluid identified. Sparing of the rectum. Vascular/Lymphatic: Aortic atherosclerosis. The abdominal vascularity is patent. No signs of abdominopelvic adenopathy. Reproductive: Prostate is unremarkable. Other: Small fat containing inguinal hernias are noted bilaterally. There is no free fluid identified. No signs of pneumoperitoneum. Musculoskeletal: No acute or significant osseous findings. IMPRESSION: 1. No acute traumatic injury identified within the chest, abdomen or pelvis. 2. Decompressed colon with diffuse mucosal enhancement, mild wall thickening with hyperemia of the Vasa recta. Intramural fatty deposition is identified involving the ascending colon and proximal transverse colon. No surrounding inflammatory fat stranding or free fluid identified. Findings are favored to represent a nonspecific infectious or inflammatory colitis. 3. Enlarged and heterogeneous thyroid gland with dominant nodule in the left lobe  measuring 2.9 cm. This has been evaluated on previous imaging. (ref: J Am Coll Radiol. 2015 Feb;12(2): 143-50). 4. Coronary artery calcifications. 5. Punctate left renal calculus. 6. Multiple pulmonary nodules. Most significant: Left solid pulmonary nodule measuring 5 mm. Per Fleischner Society Guidelines, no routine follow-up imaging is recommended. These guidelines do not apply to immunocompromised patients and patients with cancer. Follow up in patients with significant comorbidities as clinically warranted. For lung cancer screening, adhere to Lung-RADS guidelines. Reference: Radiology. 2017; 284(1):228-43. 7.  Aortic Atherosclerosis (ICD10-I70.0). Electronically Signed   By: Signa Kell M.D.   On: 10/07/2023 14:48   CT HEAD WO CONTRAST  Result Date: 10/07/2023 CLINICAL DATA:  Head trauma, moderate-severe; Polytrauma, blunt EXAM: CT HEAD WITHOUT CONTRAST CT CERVICAL SPINE WITHOUT CONTRAST TECHNIQUE: Multidetector CT imaging of the head and cervical spine was performed following the standard protocol without intravenous contrast. Multiplanar CT image reconstructions of the cervical spine were also generated. RADIATION DOSE REDUCTION: This exam was performed according to the departmental dose-optimization program which includes automated exposure control, adjustment of the mA and/or kV according to patient size and/or use of iterative reconstruction technique. COMPARISON:  None Available. FINDINGS: CT HEAD FINDINGS Brain: There is subarachnoid hemorrhage along the right cerebral convexity. There is also a or mm subdural hematoma along the right cerebral convexity. Subdural blood products extend along the right tentorial leaflet in the posterior falx no mastoid. No hydrocephalus. No evidence of intraventricular extension. No CT evidence of an acute cortical infarct. Vascular: No hyperdense vessel or unexpected calcification. Skull: There is a longitudinal fracture of the left temporal bone with blood  products in the left-sided mastoid air cells and in the middle ear. Fracture extends into the left parietal bone. See separately dictated temporal bone CT for better characterization. Sinuses/Orbits: Pansinus mucosal thickening. Orbits are unremarkable. Other: None. CT CERVICAL SPINE FINDINGS Alignment: Normal. Skull base and vertebrae: No acute fracture. No primary bone lesion or focal pathologic process. Soft tissues and spinal canal: No prevertebral fluid or swelling. No visible canal hematoma. Disc levels: There is an eccentric right disc bulge at C5-C6 with moderate spinal canal narrowing. Upper chest: Negative. Other: The thyroid is enlarged with a dominant thyroid nodule measuring up to 2.7 cm. This was  likely previously biopsied. Recommend correlation with prior biopsy results. IMPRESSION: 1. Subarachnoid hemorrhage along the right cerebral convexity. No evidence of intraventricular extension. No hydrocephalus. 2. Subdural hematoma along the right cerebral convexity measuring up to 0.4 cm. Subdural blood products extend along the right tentorial leaflet and posterior falx. No significant mass effect. 3. Longitudinal fracture of the left temporal bone with blood products in the left-sided mastoid air cells and middle ear. Fracture extends into the left parietal bone. See separately dictated temporal bone CT for better characterization. 4. No acute fracture or traumatic listhesis of the cervical spine. 5. Eccentric right disc bulge at C5-C6 with moderate spinal canal narrowing. Electronically Signed   By: Lorenza Cambridge M.D.   On: 10/07/2023 14:47   CT CERVICAL SPINE WO CONTRAST  Result Date: 10/07/2023 CLINICAL DATA:  Head trauma, moderate-severe; Polytrauma, blunt EXAM: CT HEAD WITHOUT CONTRAST CT CERVICAL SPINE WITHOUT CONTRAST TECHNIQUE: Multidetector CT imaging of the head and cervical spine was performed following the standard protocol without intravenous contrast. Multiplanar CT image  reconstructions of the cervical spine were also generated. RADIATION DOSE REDUCTION: This exam was performed according to the departmental dose-optimization program which includes automated exposure control, adjustment of the mA and/or kV according to patient size and/or use of iterative reconstruction technique. COMPARISON:  None Available. FINDINGS: CT HEAD FINDINGS Brain: There is subarachnoid hemorrhage along the right cerebral convexity. There is also a or mm subdural hematoma along the right cerebral convexity. Subdural blood products extend along the right tentorial leaflet in the posterior falx no mastoid. No hydrocephalus. No evidence of intraventricular extension. No CT evidence of an acute cortical infarct. Vascular: No hyperdense vessel or unexpected calcification. Skull: There is a longitudinal fracture of the left temporal bone with blood products in the left-sided mastoid air cells and in the middle ear. Fracture extends into the left parietal bone. See separately dictated temporal bone CT for better characterization. Sinuses/Orbits: Pansinus mucosal thickening. Orbits are unremarkable. Other: None. CT CERVICAL SPINE FINDINGS Alignment: Normal. Skull base and vertebrae: No acute fracture. No primary bone lesion or focal pathologic process. Soft tissues and spinal canal: No prevertebral fluid or swelling. No visible canal hematoma. Disc levels: There is an eccentric right disc bulge at C5-C6 with moderate spinal canal narrowing. Upper chest: Negative. Other: The thyroid is enlarged with a dominant thyroid nodule measuring up to 2.7 cm. This was likely previously biopsied. Recommend correlation with prior biopsy results. IMPRESSION: 1. Subarachnoid hemorrhage along the right cerebral convexity. No evidence of intraventricular extension. No hydrocephalus. 2. Subdural hematoma along the right cerebral convexity measuring up to 0.4 cm. Subdural blood products extend along the right tentorial leaflet and  posterior falx. No significant mass effect. 3. Longitudinal fracture of the left temporal bone with blood products in the left-sided mastoid air cells and middle ear. Fracture extends into the left parietal bone. See separately dictated temporal bone CT for better characterization. 4. No acute fracture or traumatic listhesis of the cervical spine. 5. Eccentric right disc bulge at C5-C6 with moderate spinal canal narrowing. Electronically Signed   By: Lorenza Cambridge M.D.   On: 10/07/2023 14:47   DG Chest Port 1 View  Result Date: 10/07/2023 CLINICAL DATA:  Trauma fall EXAM: PORTABLE CHEST 1 VIEW COMPARISON:  None Available. FINDINGS: The heart size and mediastinal contours are within normal limits. Both lungs are clear. The visualized skeletal structures are unremarkable. IMPRESSION: No acute abnormality of the lungs in AP portable projection. Electronically Signed   By:  Jearld Lesch M.D.   On: 10/07/2023 14:02   DG Pelvis Portable  Result Date: 10/07/2023 CLINICAL DATA:  Fall. EXAM: PORTABLE PELVIS 1-2 VIEWS COMPARISON:  None Available. FINDINGS: No acute fracture or dislocation.  Sacroiliac joints are symmetric. IMPRESSION: No acute osseous abnormality. Electronically Signed   By: Jeronimo Greaves M.D.   On: 10/07/2023 14:02    Review of Systems  Unable to perform ROS: Mental status change    Blood pressure 129/76, pulse 86, temperature 98.8 F (37.1 C), temperature source Axillary, resp. rate (!) 22, height 5\' 8"  (1.727 m), weight 61.2 kg, SpO2 97%. Physical Exam HENT:     Ears:     Comments: Some bleeding from the left ear    Nose: Nose normal.     Mouth/Throat:     Mouth: Mucous membranes are dry.  Eyes:     General: No scleral icterus.    Pupils: Pupils are equal, round, and reactive to light.  Cardiovascular:     Rate and Rhythm: Normal rate and regular rhythm.     Pulses: Normal pulses.  Pulmonary:     Effort: Pulmonary effort is normal.     Breath sounds: Normal breath sounds. No  wheezing.  Abdominal:     General: There is no distension.     Tenderness: There is no abdominal tenderness.  Musculoskeletal:        General: No tenderness or deformity.     Cervical back: No tenderness.  Skin:    General: Skin is warm.  Neurological:     Comments: GCS [id number]=9 Mild agitation, moving all extremities spontaneously      Assessment/Plan Fall from a cart  TBI/SDH/SAH -Dr. Franky Macho is consulting, he does not recommend Keppra, TBI team therapies, follow-up head CT in AM. Left temporal bone fracture, L TMJ FX -Dr. Elijah Birk is consulting, Ciprodex drops ETOH use daily - CIWA DM HTN  Admit to ICU High MDM  Liz Malady, MD 10/07/2023, 4:22 PM

## 2023-10-07 NOTE — Consult Note (Signed)
Reason for Consult:left temporal bone fracture, traumatic subarachnoid hemorrhage Referring Physician: Eloise Harman, robertt  Charles Dorsey is an 62 y.o. male.  HPI: whom while painting his deck this afternoon, fell on concrete striking his head. Bleeding noted from left ear. Heat CT showed traumatic SAH, and fluid in the mastoid along with a left temporal bone fracture. Called for evaluation. Given benadryl and haldol to complete his workup.  Past Medical History:  Diagnosis Date   Acid reflux    Diabetes mellitus without complication (HCC)    Hyperlipidemia    Hypothyroidism     Past Surgical History:  Procedure Laterality Date   CORONARY STENT INTERVENTION N/A 03/18/2020   Procedure: CORONARY STENT INTERVENTION;  Surgeon: Elder Negus, MD;  Location: [facility];  Service: Cardiovascular;  Laterality: N/A;   CORONARY ULTRASOUND/IVUS N/A 03/18/2020   Procedure: Intravascular Ultrasound/IVUS;  Surgeon: Elder Negus, MD;  Location: [facility];  Service: Cardiovascular;  Laterality: N/A;   LEFT HEART CATH AND CORONARY ANGIOGRAPHY N/A 03/18/2020   Procedure: LEFT HEART CATH AND CORONARY ANGIOGRAPHY;  Surgeon: Elder Negus, MD;  Location: [facility];  Service: Cardiovascular;  Laterality: N/A;    Family History  Problem Relation Age of Onset   Heart disease Father    Diabetes Father    Hyperlipidemia Father     Social History:  reports that he has been smoking cigarettes. He has never used smokeless tobacco. He reports current alcohol use. He reports that he does not use drugs.  Allergies: No Known Allergies  Medications: I have reviewed the patient's current medications.  Results for orders placed or performed during the hospital encounter of 10/07/23 (from the past 48 hour(s))  Comprehensive metabolic panel     Status: Abnormal   Collection Time: 10/07/23  1:15 PM  Result Value Ref Range   Sodium 136 135 - 145 mmol/L   Potassium 3.6 3.5 -  5.1 mmol/L    Comment: HEMOLYSIS AT THIS LEVEL MAY AFFECT RESULT   Chloride 105 98 - 111 mmol/L   CO2 20 (L) 22 - 32 mmol/L   Glucose, Bld 238 (H) 70 - 99 mg/dL    Comment: Glucose reference range applies only to samples taken after fasting for at least 8 hours.   BUN 16 6 - 20 mg/dL   Creatinine, Ser 2.44 0.61 - 1.24 mg/dL   Calcium 8.8 (L) 8.9 - 10.3 mg/dL   Total Protein 6.8 6.5 - 8.1 g/dL   Albumin 3.8 3.5 - 5.0 g/dL   AST 78 (H) 15 - 41 U/L    Comment: HEMOLYSIS AT THIS LEVEL MAY AFFECT RESULT   ALT 56 (H) 0 - 44 U/L    Comment: HEMOLYSIS AT THIS LEVEL MAY AFFECT RESULT   Alkaline Phosphatase 134 (H) 38 - 126 U/L   Total Bilirubin 1.0 <1.2 mg/dL    Comment: HEMOLYSIS AT THIS LEVEL MAY AFFECT RESULT   GFR, Estimated >60 >60 mL/min    Comment: (NOTE) Calculated using the CKD-EPI Creatinine Equation (2021)    Anion gap 11 5 - 15    Comment: Performed at [facility], [address] [place], [place] [address]  CBC     Status: None   Collection Time: 10/07/23  1:15 PM  Result Value Ref Range   WBC 8.0 4.0 - 10.5 K/uL   RBC 4.80 4.22 - 5.81 MIL/uL   Hemoglobin 15.2 13.0 - 17.0 g/dL   HCT 25.3 66.4 -  52.0 %   MCV 90.0 80.0 - 100.0 fL   MCH 31.7 26.0 - 34.0 pg   MCHC 35.2 30.0 - 36.0 g/dL   RDW 40.3 47.4 - 25.9 %   Platelets 228 150 - 400 K/uL   nRBC 0.0 0.0 - 0.2 %    Comment: Performed at [facility], [address], [place], [place] [address]  Ethanol     Status: Abnormal   Collection Time: 10/07/23  1:15 PM  Result Value Ref Range   Alcohol, Ethyl (B) 62 (H) <10 mg/dL    Comment: (NOTE) Lowest detectable limit for serum alcohol is 10 mg/dL.  For medical purposes only. Performed at [facility], [address] [place], [place] [address]   Protime-INR     Status: None   Collection Time: 10/07/23  1:15 PM  Result Value Ref Range   Prothrombin Time 12.7 11.4 - 15.2 seconds   INR 0.9 0.8 - 1.2    Comment: (NOTE) INR goal varies based on device  and disease states. Performed at [facility], [address], [place], [place] [address]   Sample to Blood Bank     Status: None   Collection Time: 10/07/23  1:15 PM  Result Value Ref Range   Blood Bank Specimen SAMPLE AVAILABLE FOR TESTING    Sample Expiration      10/10/2023,2359 Performed at [facility], [address] [place], [place] [address]   CBG monitoring, ED     Status: Abnormal   Collection Time: 10/07/23  1:18 PM  Result Value Ref Range   Glucose-Capillary 238 (H) 70 - 99 mg/dL    Comment: Glucose reference range applies only to samples taken after fasting for at least 8 hours.  I-Stat Chem 8, ED     Status: Abnormal   Collection Time: 10/07/23  1:23 PM  Result Value Ref Range   Sodium 139 135 - 145 mmol/L   Potassium 3.5 3.5 - 5.1 mmol/L   Chloride 105 98 - 111 mmol/L   BUN 20 6 - 20 mg/dL   Creatinine, Ser 0.63 0.61 - 1.24 mg/dL   Glucose, Bld 016 (H) 70 - 99 mg/dL    Comment: Glucose reference range applies only to samples taken after fasting for at least 8 hours.   Calcium, Ion 1.00 (L) 1.15 - 1.40 mmol/L   TCO2 21 (L) 22 - 32 mmol/L   Hemoglobin 15.6 13.0 - 17.0 g/dL   HCT 01.0 93.2 - 35.5 %  I-Stat Lactic Acid, ED     Status: Abnormal   Collection Time: 10/07/23  1:24 PM  Result Value Ref Range   Lactic Acid, Venous 3.4 (HH) 0.5 - 1.9 mmol/L   Comment NOTIFIED PHYSICIAN     CT Temporal Bones Wo Contrast  Result Date: 10/07/2023 CLINICAL DATA:  L ear bleeding and trauma EXAM: CT TEMPORAL BONES WITHOUT CONTRAST TECHNIQUE: Axial and coronal plane CT imaging of the petrous temporal bones was performed with thin-collimation image reconstruction. No intravenous contrast was administered. Multiplanar CT image reconstructions were also generated. RADIATION DOSE REDUCTION: This exam was performed according to the departmental dose-optimization program which includes automated exposure control, adjustment of the mA and/or kV according to patient size  and/or use of iterative reconstruction technique. COMPARISON:  None Available. FINDINGS: RIGHT TEMPORAL BONE External auditory canal: Normal. Middle ear cavity: Normally aerated. The scutum and ossicles are normal. The tegmen tympani is intact. Inner ear structures: The cochlea, vestibule  and semicircular canals are normal. The vestibular aqueduct is not enlarged. Internal auditory and facial nerve canals:  Normal Mastoid air cells: Normally aerated. No osseous erosion. LEFT TEMPORAL BONE There is a longitudinal fracture through the left temporal bone that extends into the left TMJ. The fracture extends through the scutum (series 15, image 110), but without evidence of ossicular disruption inner ear structures are spared. There are blood products surrounding the ossicles. External auditory canal: There are layering blood products in the left external auditory canal Middle ear cavity: There are blood products in the left middle ear cavity surrounding the ossicles. Ossicles are intact. Fracture extends through the tegmen tympani and tegmen mastoideum. Inner ear structures: The cochlea, vestibule and semicircular canals are normal. The vestibular aqueduct is not enlarged. Internal auditory and facial nerve canals:  Normal. Mastoid air cells: There are blood products in the left mastoid air cells. Vascular: Normal non-contrast appearance of the carotid canals, jugular bulbs and sigmoid plates. Limited intracranial:  See separately dictated CT head Visible orbits/paranasal sinuses: No acute or significant finding. Soft tissues: Normal. IMPRESSION: 1. Longitudinal fracture through the left temporal bone that extends into the left TMJ. The fracture extends through the scutum, tegmen tympani, and tegmen mastoideum. There is hemotympanum without evidence of ossicular disruption or inner ear involvement. 2. Superiorly the fracture extends across into the left parietal bone. Findings were discussed with Dr. Jarold Dorsey on  10/07/23 at 3:00 PM. Findings on same day CT head and CT cervical spine were also discussed at this time. Electronically Signed   By: Charles Dorsey M.D.   On: 10/07/2023 15:01   CT CHEST ABDOMEN PELVIS W CONTRAST  Result Date: 10/07/2023 CLINICAL DATA:  Patient was on a cart that was about 3 feet tall and fell from a height of about 6p. Confusion with altered mental status. Bleeding from left ear. EXAM: CT CHEST, ABDOMEN, AND PELVIS WITH CONTRAST TECHNIQUE: Multidetector CT imaging of the chest, abdomen and pelvis was performed following the standard protocol during bolus administration of intravenous contrast. RADIATION DOSE REDUCTION: This exam was performed according to the departmental dose-optimization program which includes automated exposure control, adjustment of the mA and/or kV according to patient size and/or use of iterative reconstruction technique. CONTRAST:  75mL OMNIPAQUE IOHEXOL 350 MG/ML SOLN COMPARISON:  CT AP 12/21/20 FINDINGS: CT CHEST FINDINGS Cardiovascular: Heart size is normal. Aortic atherosclerosis and coronary artery calcifications. No pericardial effusion. Thoracic aorta appears intact. Mediastinum/Nodes: Enlarged and heterogeneous thyroid gland is identified with dominant nodule in the left lobe measuring 2.9 cm The trachea appears patent and midline. Normal appearance of the esophagus. No mediastinal or hilar adenopathy. Lungs/Pleura: No pleural effusion. No airspace consolidation, atelectasis, or pneumothorax. Dependent change with ground-glass attenuation within the posterior lower lobes. Small scattered lung nodules are identified, including -5 mm left lower lobe nodule, image 108/5. -4 mm subpleural nodule in the posterior left base, image 101/5 -5 mm right middle lobe lung nodule., image 81/5. Musculoskeletal: No acute fracture identified thoracic vertebral body heights are well preserved. No rib fractures identified. CT ABDOMEN PELVIS FINDINGS Hepatobiliary: No focal liver  abnormality is seen. No gallstones, gallbladder wall thickening, or biliary dilatation. Pancreas: Unremarkable. No pancreatic ductal dilatation or surrounding inflammatory changes. Spleen: Normal in size without focal abnormality. Adrenals/Urinary Tract: No adrenal hemorrhage or renal injury identified. Punctate left renal calculus. Benign Bosniak class 1 cyst arises off the upper pole of right kidney measuring 1.8 cm. No follow-up imaging recommended. Bladder is normal. Bladder is unremarkable.  Stomach/Bowel: Stomach is within normal limits. Small bowel loops are normal in course and caliber without evidence for bowel wall thickening, inflammation or distension. The appendix is visualized and appears normal. Decompressed colon with diffuse mucosal enhancement, and mild wall thickening with hyperemia of the Vasa recta. Intramural fatty deposition is identified involving the ascending colon and proximal transverse colon. No surrounding inflammatory fat stranding or free fluid identified. Sparing of the rectum. Vascular/Lymphatic: Aortic atherosclerosis. The abdominal vascularity is patent. No signs of abdominopelvic adenopathy. Reproductive: Prostate is unremarkable. Other: Small fat containing inguinal hernias are noted bilaterally. There is no free fluid identified. No signs of pneumoperitoneum. Musculoskeletal: No acute or significant osseous findings. IMPRESSION: 1. No acute traumatic injury identified within the chest, abdomen or pelvis. 2. Decompressed colon with diffuse mucosal enhancement, mild wall thickening with hyperemia of the Vasa recta. Intramural fatty deposition is identified involving the ascending colon and proximal transverse colon. No surrounding inflammatory fat stranding or free fluid identified. Findings are favored to represent a nonspecific infectious or inflammatory colitis. 3. Enlarged and heterogeneous thyroid gland with dominant nodule in the left lobe measuring 2.9 cm. This has been  evaluated on previous imaging. (ref: J Am Coll Radiol. 2015 Feb;12(2): 143-50). 4. Coronary artery calcifications. 5. Punctate left renal calculus. 6. Multiple pulmonary nodules. Most significant: Left solid pulmonary nodule measuring 5 mm. Per Fleischner Society Guidelines, no routine follow-up imaging is recommended. These guidelines do not apply to immunocompromised patients and patients with cancer. Follow up in patients with significant comorbidities as clinically warranted. For lung cancer screening, adhere to Lung-RADS guidelines. Reference: Radiology. 2017; 284(1):228-43. 7.  Aortic Atherosclerosis (ICD10-I70.0). Electronically Signed   By: Charles Dorsey M.D.   On: 10/07/2023 14:48   CT HEAD WO CONTRAST  Result Date: 10/07/2023 CLINICAL DATA:  Head trauma, moderate-severe; Polytrauma, blunt EXAM: CT HEAD WITHOUT CONTRAST CT CERVICAL SPINE WITHOUT CONTRAST TECHNIQUE: Multidetector CT imaging of the head and cervical spine was performed following the standard protocol without intravenous contrast. Multiplanar CT image reconstructions of the cervical spine were also generated. RADIATION DOSE REDUCTION: This exam was performed according to the departmental dose-optimization program which includes automated exposure control, adjustment of the mA and/or kV according to patient size and/or use of iterative reconstruction technique. COMPARISON:  None Available. FINDINGS: CT HEAD FINDINGS Brain: There is subarachnoid hemorrhage along the right cerebral convexity. There is also a or mm subdural hematoma along the right cerebral convexity. Subdural blood products extend along the right tentorial leaflet in the posterior falx no mastoid. No hydrocephalus. No evidence of intraventricular extension. No CT evidence of an acute cortical infarct. Vascular: No hyperdense vessel or unexpected calcification. Skull: There is a longitudinal fracture of the left temporal bone with blood products in the left-sided mastoid air  cells and in the middle ear. Fracture extends into the left parietal bone. See separately dictated temporal bone CT for better characterization. Sinuses/Orbits: Pansinus mucosal thickening. Orbits are unremarkable. Other: None. CT CERVICAL SPINE FINDINGS Alignment: Normal. Skull base and vertebrae: No acute fracture. No primary bone lesion or focal pathologic process. Soft tissues and spinal canal: No prevertebral fluid or swelling. No visible canal hematoma. Disc levels: There is an eccentric right disc bulge at C5-C6 with moderate spinal canal narrowing. Upper chest: Negative. Other: The thyroid is enlarged with a dominant thyroid nodule measuring up to 2.7 cm. This was likely previously biopsied. Recommend correlation with prior biopsy results. IMPRESSION: 1. Subarachnoid hemorrhage along the right cerebral convexity. No evidence of intraventricular extension.  No hydrocephalus. 2. Subdural hematoma along the right cerebral convexity measuring up to 0.4 cm. Subdural blood products extend along the right tentorial leaflet and posterior falx. No significant mass effect. 3. Longitudinal fracture of the left temporal bone with blood products in the left-sided mastoid air cells and middle ear. Fracture extends into the left parietal bone. See separately dictated temporal bone CT for better characterization. 4. No acute fracture or traumatic listhesis of the cervical spine. 5. Eccentric right disc bulge at C5-C6 with moderate spinal canal narrowing. Electronically Signed   By: Charles Dorsey M.D.   On: 10/07/2023 14:47   CT CERVICAL SPINE WO CONTRAST  Result Date: 10/07/2023 CLINICAL DATA:  Head trauma, moderate-severe; Polytrauma, blunt EXAM: CT HEAD WITHOUT CONTRAST CT CERVICAL SPINE WITHOUT CONTRAST TECHNIQUE: Multidetector CT imaging of the head and cervical spine was performed following the standard protocol without intravenous contrast. Multiplanar CT image reconstructions of the cervical spine were also  generated. RADIATION DOSE REDUCTION: This exam was performed according to the departmental dose-optimization program which includes automated exposure control, adjustment of the mA and/or kV according to patient size and/or use of iterative reconstruction technique. COMPARISON:  None Available. FINDINGS: CT HEAD FINDINGS Brain: There is subarachnoid hemorrhage along the right cerebral convexity. There is also a or mm subdural hematoma along the right cerebral convexity. Subdural blood products extend along the right tentorial leaflet in the posterior falx no mastoid. No hydrocephalus. No evidence of intraventricular extension. No CT evidence of an acute cortical infarct. Vascular: No hyperdense vessel or unexpected calcification. Skull: There is a longitudinal fracture of the left temporal bone with blood products in the left-sided mastoid air cells and in the middle ear. Fracture extends into the left parietal bone. See separately dictated temporal bone CT for better characterization. Sinuses/Orbits: Pansinus mucosal thickening. Orbits are unremarkable. Other: None. CT CERVICAL SPINE FINDINGS Alignment: Normal. Skull base and vertebrae: No acute fracture. No primary bone lesion or focal pathologic process. Soft tissues and spinal canal: No prevertebral fluid or swelling. No visible canal hematoma. Disc levels: There is an eccentric right disc bulge at C5-C6 with moderate spinal canal narrowing. Upper chest: Negative. Other: The thyroid is enlarged with a dominant thyroid nodule measuring up to 2.7 cm. This was likely previously biopsied. Recommend correlation with prior biopsy results. IMPRESSION: 1. Subarachnoid hemorrhage along the right cerebral convexity. No evidence of intraventricular extension. No hydrocephalus. 2. Subdural hematoma along the right cerebral convexity measuring up to 0.4 cm. Subdural blood products extend along the right tentorial leaflet and posterior falx. No significant mass effect. 3.  Longitudinal fracture of the left temporal bone with blood products in the left-sided mastoid air cells and middle ear. Fracture extends into the left parietal bone. See separately dictated temporal bone CT for better characterization. 4. No acute fracture or traumatic listhesis of the cervical spine. 5. Eccentric right disc bulge at C5-C6 with moderate spinal canal narrowing. Electronically Signed   By: Charles Dorsey M.D.   On: 10/07/2023 14:47   DG Chest Port 1 View  Result Date: 10/07/2023 CLINICAL DATA:  Trauma fall EXAM: PORTABLE CHEST 1 VIEW COMPARISON:  None Available. FINDINGS: The heart size and mediastinal contours are within normal limits. Both lungs are clear. The visualized skeletal structures are unremarkable. IMPRESSION: No acute abnormality of the lungs in AP portable projection. Electronically Signed   By: Charles Dorsey M.D.   On: 10/07/2023 14:02   DG Pelvis Portable  Result Date: 10/07/2023 CLINICAL DATA:  Fall. EXAM:  PORTABLE PELVIS 1-2 VIEWS COMPARISON:  None Available. FINDINGS: No acute fracture or dislocation.  Sacroiliac joints are symmetric. IMPRESSION: No acute osseous abnormality. Electronically Signed   By: Charles Dorsey M.D.   On: 10/07/2023 14:02    Review of Systems Blood pressure 129/76, pulse 86, temperature 98.8 F (37.1 C), temperature source Axillary, resp. rate (!) 22, height 5\' 8"  (1.727 m), weight 61.2 kg, SpO2 97%. Physical Exam Constitutional:      General: He is in acute distress.  HENT:     Head: Normocephalic.     Comments: Blood in left ear Neurological:     Mental Status: He is lethargic.     Comments: Left seventh nerve deficit Unable to test coordination, strength given lethargy Not following all commands Is moving all extremities Able to poorly phonate Charles Dorsey, no spontaneous speech     Assessment/Plan: JESSEN HAILU is a 62 y.o. male Whom fell earlier today. Has sustained a closed head injury evidenced by the Tsah, and the  temporal bone fracture. 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  Repeat head CT in AM unless neurological exam changes  Coletta Memos 10/07/2023, 4:00 PM

## 2023-10-07 NOTE — Progress Notes (Signed)
Orthopedic Tech Progress Note Patient Details:  Charles Dorsey January 27, 1961 [id number]  Patient ID: Charles Dorsey, male   DOB: September 06, 1961, 62 y.o.   MRN: [id number] Level II; not currently needed. [place] A Ladasia Sircy 10/07/2023, 1:42 PM

## 2023-10-07 NOTE — ED Triage Notes (Addendum)
Pt BIB [facility] from home. Pt was on a cart that about 3 feet tall and fell from a height of about 6 feet total. Upon EMS arrival pt was confused and only A&Ox1. Pt hit his left side of his head. Hematoma noticed. Pt also bleeding from left ear. GCS 13. EMS denies any blood thinners. Pt was nauseated in route, EMS gave 4mg  of Zofran. C-Collar in place upon arrival to ED.   EMS  BP 186/98 P 92 20 R 100 % RA O2 225 CBG 18 LAC

## 2023-10-07 NOTE — ED Notes (Signed)
EDP at bedside and removed C-Collar.

## 2023-10-07 NOTE — Consult Note (Signed)
Reason for Consult: Left temporal bone fracture Referring Physician: ER doctor  Charles VALLES is an 62 y.o. male.  HPI: 62 year old male fell off a ladder and landed on his head.  Suffered left temporal bone fracture and other traumatic brain injury.  Neurosurgery has seen the patient and I was consulted for advice regarding his temporal bone fracture.  Past Medical History:  Diagnosis Date   Acid reflux    Diabetes mellitus without complication (HCC)    Hyperlipidemia    Hypothyroidism     Past Surgical History:  Procedure Laterality Date   CORONARY STENT INTERVENTION N/A 03/18/2020   Procedure: CORONARY STENT INTERVENTION;  Surgeon: Elder Negus, MD;  Location: [facility];  Service: Cardiovascular;  Laterality: N/A;   CORONARY ULTRASOUND/IVUS N/A 03/18/2020   Procedure: Intravascular Ultrasound/IVUS;  Surgeon: Elder Negus, MD;  Location: [facility];  Service: Cardiovascular;  Laterality: N/A;   LEFT HEART CATH AND CORONARY ANGIOGRAPHY N/A 03/18/2020   Procedure: LEFT HEART CATH AND CORONARY ANGIOGRAPHY;  Surgeon: Elder Negus, MD;  Location: [facility];  Service: Cardiovascular;  Laterality: N/A;    Family History  Problem Relation Age of Onset   Heart disease Father    Diabetes Father    Hyperlipidemia Father     Social History:  reports that he has been smoking cigarettes. He has never used smokeless tobacco. He reports current alcohol use. He reports that he does not use drugs.  Allergies: No Known Allergies  Medications: I have reviewed the patient's current medications.  Results for orders placed or performed during the hospital encounter of 10/07/23 (from the past 48 hour(s))  Comprehensive metabolic panel     Status: Abnormal   Collection Time: 10/07/23  1:15 PM  Result Value Ref Range   Sodium 136 135 - 145 mmol/L   Potassium 3.6 3.5 - 5.1 mmol/L    Comment: HEMOLYSIS AT THIS LEVEL MAY AFFECT RESULT   Chloride 105 98 -  111 mmol/L   CO2 20 (L) 22 - 32 mmol/L   Glucose, Bld 238 (H) 70 - 99 mg/dL    Comment: Glucose reference range applies only to samples taken after fasting for at least 8 hours.   BUN 16 6 - 20 mg/dL   Creatinine, Ser 3.29 0.61 - 1.24 mg/dL   Calcium 8.8 (L) 8.9 - 10.3 mg/dL   Total Protein 6.8 6.5 - 8.1 g/dL   Albumin 3.8 3.5 - 5.0 g/dL   AST 78 (H) 15 - 41 U/L    Comment: HEMOLYSIS AT THIS LEVEL MAY AFFECT RESULT   ALT 56 (H) 0 - 44 U/L    Comment: HEMOLYSIS AT THIS LEVEL MAY AFFECT RESULT   Alkaline Phosphatase 134 (H) 38 - 126 U/L   Total Bilirubin 1.0 <1.2 mg/dL    Comment: HEMOLYSIS AT THIS LEVEL MAY AFFECT RESULT   GFR, Estimated >60 >60 mL/min    Comment: (NOTE) Calculated using the CKD-EPI Creatinine Equation (2021)    Anion gap 11 5 - 15    Comment: Performed at [facility], [address] [place], [place] [address]  CBC     Status: None   Collection Time: 10/07/23  1:15 PM  Result Value Ref Range   WBC 8.0 4.0 - 10.5 K/uL   RBC 4.80 4.22 - 5.81 MIL/uL   Hemoglobin 15.2 13.0 - 17.0 g/dL   HCT 16.6 06.3 - 01.6 %   MCV 90.0 80.0 - 100.0  fL   MCH 31.7 26.0 - 34.0 pg   MCHC 35.2 30.0 - 36.0 g/dL   RDW 46.9 62.9 - 52.8 %   Platelets 228 150 - 400 K/uL   nRBC 0.0 0.0 - 0.2 %    Comment: Performed at [facility], [address], [place], [place] [address]  Ethanol     Status: Abnormal   Collection Time: 10/07/23  1:15 PM  Result Value Ref Range   Alcohol, Ethyl (B) 62 (H) <10 mg/dL    Comment: (NOTE) Lowest detectable limit for serum alcohol is 10 mg/dL.  For medical purposes only. Performed at [facility], [address], [place], [place] [address]   Protime-INR     Status: None   Collection Time: 10/07/23  1:15 PM  Result Value Ref Range   Prothrombin Time 12.7 11.4 - 15.2 seconds   INR 0.9 0.8 - 1.2    Comment: (NOTE) INR goal varies based on device and disease states. Performed at [facility], [address] [place],  [place] [address]   Sample to Blood Bank     Status: None   Collection Time: 10/07/23  1:15 PM  Result Value Ref Range   Blood Bank Specimen SAMPLE AVAILABLE FOR TESTING    Sample Expiration      10/10/2023,2359 Performed at [facility], [address], [place], [place] [address]   CBG monitoring, ED     Status: Abnormal   Collection Time: 10/07/23  1:18 PM  Result Value Ref Range   Glucose-Capillary 238 (H) 70 - 99 mg/dL    Comment: Glucose reference range applies only to samples taken after fasting for at least 8 hours.  I-Stat Chem 8, ED     Status: Abnormal   Collection Time: 10/07/23  1:23 PM  Result Value Ref Range   Sodium 139 135 - 145 mmol/L   Potassium 3.5 3.5 - 5.1 mmol/L   Chloride 105 98 - 111 mmol/L   BUN 20 6 - 20 mg/dL   Creatinine, Ser 4.74 0.61 - 1.24 mg/dL   Glucose, Bld 259 (H) 70 - 99 mg/dL    Comment: Glucose reference range applies only to samples taken after fasting for at least 8 hours.   Calcium, Ion 1.00 (L) 1.15 - 1.40 mmol/L   TCO2 21 (L) 22 - 32 mmol/L   Hemoglobin 15.6 13.0 - 17.0 g/dL   HCT 56.3 87.5 - 64.3 %  I-Stat Lactic Acid, ED     Status: Abnormal   Collection Time: 10/07/23  1:24 PM  Result Value Ref Range   Lactic Acid, Venous 3.4 (HH) 0.5 - 1.9 mmol/L   Comment NOTIFIED PHYSICIAN     CT Temporal Bones Wo Contrast  Result Date: 10/07/2023 CLINICAL DATA:  L ear bleeding and trauma EXAM: CT TEMPORAL BONES WITHOUT CONTRAST TECHNIQUE: Axial and coronal plane CT imaging of the petrous temporal bones was performed with thin-collimation image reconstruction. No intravenous contrast was administered. Multiplanar CT image reconstructions were also generated. RADIATION DOSE REDUCTION: This exam was performed according to the departmental dose-optimization program which includes automated exposure control, adjustment of the mA and/or kV according to patient size and/or use of iterative reconstruction technique. COMPARISON:  None Available. FINDINGS:  RIGHT TEMPORAL BONE External auditory canal: Normal. Middle ear cavity: Normally aerated. The scutum and ossicles are normal. The tegmen tympani is intact. Inner ear structures: The cochlea, vestibule and semicircular canals are normal. The vestibular aqueduct is  not enlarged. Internal auditory and facial nerve canals:  Normal Mastoid air cells: Normally aerated. No osseous erosion. LEFT TEMPORAL BONE There is a longitudinal fracture through the left temporal bone that extends into the left TMJ. The fracture extends through the scutum (series 15, image 110), but without evidence of ossicular disruption inner ear structures are spared. There are blood products surrounding the ossicles. External auditory canal: There are layering blood products in the left external auditory canal Middle ear cavity: There are blood products in the left middle ear cavity surrounding the ossicles. Ossicles are intact. Fracture extends through the tegmen tympani and tegmen mastoideum. Inner ear structures: The cochlea, vestibule and semicircular canals are normal. The vestibular aqueduct is not enlarged. Internal auditory and facial nerve canals:  Normal. Mastoid air cells: There are blood products in the left mastoid air cells. Vascular: Normal non-contrast appearance of the carotid canals, jugular bulbs and sigmoid plates. Limited intracranial:  See separately dictated CT head Visible orbits/paranasal sinuses: No acute or significant finding. Soft tissues: Normal. IMPRESSION: 1. Longitudinal fracture through the left temporal bone that extends into the left TMJ. The fracture extends through the scutum, tegmen tympani, and tegmen mastoideum. There is hemotympanum without evidence of ossicular disruption or inner ear involvement. 2. Superiorly the fracture extends across into the left parietal bone. Findings were discussed with Dr. Jarold Motto on 10/07/23 at 3:00 PM. Findings on same day CT head and CT cervical spine were also discussed at  this time. Electronically Signed   By: Lorenza Cambridge M.D.   On: 10/07/2023 15:01   CT CHEST ABDOMEN PELVIS W CONTRAST  Result Date: 10/07/2023 CLINICAL DATA:  Patient was on a cart that was about 3 feet tall and fell from a height of about 6p. Confusion with altered mental status. Bleeding from left ear. EXAM: CT CHEST, ABDOMEN, AND PELVIS WITH CONTRAST TECHNIQUE: Multidetector CT imaging of the chest, abdomen and pelvis was performed following the standard protocol during bolus administration of intravenous contrast. RADIATION DOSE REDUCTION: This exam was performed according to the departmental dose-optimization program which includes automated exposure control, adjustment of the mA and/or kV according to patient size and/or use of iterative reconstruction technique. CONTRAST:  75mL OMNIPAQUE IOHEXOL 350 MG/ML SOLN COMPARISON:  CT AP 12/21/20 FINDINGS: CT CHEST FINDINGS Cardiovascular: Heart size is normal. Aortic atherosclerosis and coronary artery calcifications. No pericardial effusion. Thoracic aorta appears intact. Mediastinum/Nodes: Enlarged and heterogeneous thyroid gland is identified with dominant nodule in the left lobe measuring 2.9 cm The trachea appears patent and midline. Normal appearance of the esophagus. No mediastinal or hilar adenopathy. Lungs/Pleura: No pleural effusion. No airspace consolidation, atelectasis, or pneumothorax. Dependent change with ground-glass attenuation within the posterior lower lobes. Small scattered lung nodules are identified, including -5 mm left lower lobe nodule, image 108/5. -4 mm subpleural nodule in the posterior left base, image 101/5 -5 mm right middle lobe lung nodule., image 81/5. Musculoskeletal: No acute fracture identified thoracic vertebral body heights are well preserved. No rib fractures identified. CT ABDOMEN PELVIS FINDINGS Hepatobiliary: No focal liver abnormality is seen. No gallstones, gallbladder wall thickening, or biliary dilatation. Pancreas:  Unremarkable. No pancreatic ductal dilatation or surrounding inflammatory changes. Spleen: Normal in size without focal abnormality. Adrenals/Urinary Tract: No adrenal hemorrhage or renal injury identified. Punctate left renal calculus. Benign Bosniak class 1 cyst arises off the upper pole of right kidney measuring 1.8 cm. No follow-up imaging recommended. Bladder is normal. Bladder is unremarkable. Stomach/Bowel: Stomach is within normal limits. Small bowel loops  are normal in course and caliber without evidence for bowel wall thickening, inflammation or distension. The appendix is visualized and appears normal. Decompressed colon with diffuse mucosal enhancement, and mild wall thickening with hyperemia of the Vasa recta. Intramural fatty deposition is identified involving the ascending colon and proximal transverse colon. No surrounding inflammatory fat stranding or free fluid identified. Sparing of the rectum. Vascular/Lymphatic: Aortic atherosclerosis. The abdominal vascularity is patent. No signs of abdominopelvic adenopathy. Reproductive: Prostate is unremarkable. Other: Small fat containing inguinal hernias are noted bilaterally. There is no free fluid identified. No signs of pneumoperitoneum. Musculoskeletal: No acute or significant osseous findings. IMPRESSION: 1. No acute traumatic injury identified within the chest, abdomen or pelvis. 2. Decompressed colon with diffuse mucosal enhancement, mild wall thickening with hyperemia of the Vasa recta. Intramural fatty deposition is identified involving the ascending colon and proximal transverse colon. No surrounding inflammatory fat stranding or free fluid identified. Findings are favored to represent a nonspecific infectious or inflammatory colitis. 3. Enlarged and heterogeneous thyroid gland with dominant nodule in the left lobe measuring 2.9 cm. This has been evaluated on previous imaging. (ref: J Am Coll Radiol. 2015 Feb;12(2): 143-50). 4. Coronary artery  calcifications. 5. Punctate left renal calculus. 6. Multiple pulmonary nodules. Most significant: Left solid pulmonary nodule measuring 5 mm. Per Fleischner Society Guidelines, no routine follow-up imaging is recommended. These guidelines do not apply to immunocompromised patients and patients with cancer. Follow up in patients with significant comorbidities as clinically warranted. For lung cancer screening, adhere to Lung-RADS guidelines. Reference: Radiology. 2017; 284(1):228-43. 7.  Aortic Atherosclerosis (ICD10-I70.0). Electronically Signed   By: Signa Kell M.D.   On: 10/07/2023 14:48   CT HEAD WO CONTRAST  Result Date: 10/07/2023 CLINICAL DATA:  Head trauma, moderate-severe; Polytrauma, blunt EXAM: CT HEAD WITHOUT CONTRAST CT CERVICAL SPINE WITHOUT CONTRAST TECHNIQUE: Multidetector CT imaging of the head and cervical spine was performed following the standard protocol without intravenous contrast. Multiplanar CT image reconstructions of the cervical spine were also generated. RADIATION DOSE REDUCTION: This exam was performed according to the departmental dose-optimization program which includes automated exposure control, adjustment of the mA and/or kV according to patient size and/or use of iterative reconstruction technique. COMPARISON:  None Available. FINDINGS: CT HEAD FINDINGS Brain: There is subarachnoid hemorrhage along the right cerebral convexity. There is also a or mm subdural hematoma along the right cerebral convexity. Subdural blood products extend along the right tentorial leaflet in the posterior falx no mastoid. No hydrocephalus. No evidence of intraventricular extension. No CT evidence of an acute cortical infarct. Vascular: No hyperdense vessel or unexpected calcification. Skull: There is a longitudinal fracture of the left temporal bone with blood products in the left-sided mastoid air cells and in the middle ear. Fracture extends into the left parietal bone. See separately dictated  temporal bone CT for better characterization. Sinuses/Orbits: Pansinus mucosal thickening. Orbits are unremarkable. Other: None. CT CERVICAL SPINE FINDINGS Alignment: Normal. Skull base and vertebrae: No acute fracture. No primary bone lesion or focal pathologic process. Soft tissues and spinal canal: No prevertebral fluid or swelling. No visible canal hematoma. Disc levels: There is an eccentric right disc bulge at C5-C6 with moderate spinal canal narrowing. Upper chest: Negative. Other: The thyroid is enlarged with a dominant thyroid nodule measuring up to 2.7 cm. This was likely previously biopsied. Recommend correlation with prior biopsy results. IMPRESSION: 1. Subarachnoid hemorrhage along the right cerebral convexity. No evidence of intraventricular extension. No hydrocephalus. 2. Subdural hematoma along the right cerebral  convexity measuring up to 0.4 cm. Subdural blood products extend along the right tentorial leaflet and posterior falx. No significant mass effect. 3. Longitudinal fracture of the left temporal bone with blood products in the left-sided mastoid air cells and middle ear. Fracture extends into the left parietal bone. See separately dictated temporal bone CT for better characterization. 4. No acute fracture or traumatic listhesis of the cervical spine. 5. Eccentric right disc bulge at C5-C6 with moderate spinal canal narrowing. Electronically Signed   By: Lorenza Cambridge M.D.   On: 10/07/2023 14:47   CT CERVICAL SPINE WO CONTRAST  Result Date: 10/07/2023 CLINICAL DATA:  Head trauma, moderate-severe; Polytrauma, blunt EXAM: CT HEAD WITHOUT CONTRAST CT CERVICAL SPINE WITHOUT CONTRAST TECHNIQUE: Multidetector CT imaging of the head and cervical spine was performed following the standard protocol without intravenous contrast. Multiplanar CT image reconstructions of the cervical spine were also generated. RADIATION DOSE REDUCTION: This exam was performed according to the departmental  dose-optimization program which includes automated exposure control, adjustment of the mA and/or kV according to patient size and/or use of iterative reconstruction technique. COMPARISON:  None Available. FINDINGS: CT HEAD FINDINGS Brain: There is subarachnoid hemorrhage along the right cerebral convexity. There is also a or mm subdural hematoma along the right cerebral convexity. Subdural blood products extend along the right tentorial leaflet in the posterior falx no mastoid. No hydrocephalus. No evidence of intraventricular extension. No CT evidence of an acute cortical infarct. Vascular: No hyperdense vessel or unexpected calcification. Skull: There is a longitudinal fracture of the left temporal bone with blood products in the left-sided mastoid air cells and in the middle ear. Fracture extends into the left parietal bone. See separately dictated temporal bone CT for better characterization. Sinuses/Orbits: Pansinus mucosal thickening. Orbits are unremarkable. Other: None. CT CERVICAL SPINE FINDINGS Alignment: Normal. Skull base and vertebrae: No acute fracture. No primary bone lesion or focal pathologic process. Soft tissues and spinal canal: No prevertebral fluid or swelling. No visible canal hematoma. Disc levels: There is an eccentric right disc bulge at C5-C6 with moderate spinal canal narrowing. Upper chest: Negative. Other: The thyroid is enlarged with a dominant thyroid nodule measuring up to 2.7 cm. This was likely previously biopsied. Recommend correlation with prior biopsy results. IMPRESSION: 1. Subarachnoid hemorrhage along the right cerebral convexity. No evidence of intraventricular extension. No hydrocephalus. 2. Subdural hematoma along the right cerebral convexity measuring up to 0.4 cm. Subdural blood products extend along the right tentorial leaflet and posterior falx. No significant mass effect. 3. Longitudinal fracture of the left temporal bone with blood products in the left-sided mastoid  air cells and middle ear. Fracture extends into the left parietal bone. See separately dictated temporal bone CT for better characterization. 4. No acute fracture or traumatic listhesis of the cervical spine. 5. Eccentric right disc bulge at C5-C6 with moderate spinal canal narrowing. Electronically Signed   By: Lorenza Cambridge M.D.   On: 10/07/2023 14:47   DG Chest Port 1 View  Result Date: 10/07/2023 CLINICAL DATA:  Trauma fall EXAM: PORTABLE CHEST 1 VIEW COMPARISON:  None Available. FINDINGS: The heart size and mediastinal contours are within normal limits. Both lungs are clear. The visualized skeletal structures are unremarkable. IMPRESSION: No acute abnormality of the lungs in AP portable projection. Electronically Signed   By: Jearld Lesch M.D.   On: 10/07/2023 14:02   DG Pelvis Portable  Result Date: 10/07/2023 CLINICAL DATA:  Fall. EXAM: PORTABLE PELVIS 1-2 VIEWS COMPARISON:  None Available. FINDINGS:  No acute fracture or dislocation.  Sacroiliac joints are symmetric. IMPRESSION: No acute osseous abnormality. Electronically Signed   By: Jeronimo Greaves M.D.   On: 10/07/2023 14:02    Review of Systems Blood pressure 129/76, pulse 86, temperature 98.8 F (37.1 C), temperature source Axillary, resp. rate (!) 22, height 5\' 8"  (1.727 m), weight 61.2 kg, SpO2 97%.  Physical Exam  Patient is arousable to pain.  When pinching the skin of his chest I was able to see movement in all branches of the left facial nerve.  There did appear to be a weakness in the buccal and marginal branches but movement was present. Patient has bloody otorrhea from the left ear. Neuro -facial nerve function as above Face atraumatic without bony step-offs Neck without mass or adenopathy  CT scan -patient has a longitudinal fracture involving the left temporal bone which appears to spare the middle ear ossicles.  Patient also has a posterior TMJ fracture.  Assessment/Plan:  Longitudinal fracture left temporal  bone Left TMJ fracture  Nothing acutely needs to happen regarding the left temporal bone fracture.  I would use Ciprodex drops into the left ear canal twice daily.  The patient's facial nerve is intact because all branches show movement on my exam today.  If the patient loses movement in his face, consideration should be given to ENG testing and 5 to 7 days.  Surgical decompression could be considered.  Reports have also supported the use of IV steroids particularly when there is some function involving the facial nerve as in this case.  Less contraindicated by neurosurgery, I would recommend Decadron 10 mg IV every 8 hours.  I would defer to oral surgery regarding management of the left TMJ fracture.  This is out of my scope of practice.  Rejeana Brock Jr. 10/07/2023, 4:18 PM

## 2023-10-08 ENCOUNTER — Inpatient Hospital Stay (HOSPITAL_COMMUNITY): Payer: BC Managed Care – PPO

## 2023-10-08 LAB — BASIC METABOLIC PANEL
Anion gap: 6 (ref 5–15)
BUN: 15 mg/dL (ref 6–20)
CO2: 22 mmol/L (ref 22–32)
Calcium: 8 mg/dL — ABNORMAL LOW (ref 8.9–10.3)
Chloride: 110 mmol/L (ref 98–111)
Creatinine, Ser: 0.73 mg/dL (ref 0.61–1.24)
GFR, Estimated: >60 mL/min (ref >60)
Glucose, Bld: 192 mg/dL — ABNORMAL HIGH (ref 70–99)
Potassium: 3.7 mmol/L (ref 3.5–5.1)
Sodium: 138 mmol/L (ref 135–145)

## 2023-10-08 LAB — GLUCOSE, CAPILLARY
Glucose-Capillary: 144 mg/dL — ABNORMAL HIGH (ref 70–99)
Glucose-Capillary: 169 mg/dL — ABNORMAL HIGH (ref 70–99)
Glucose-Capillary: 186 mg/dL — ABNORMAL HIGH (ref 70–99)
Glucose-Capillary: 191 mg/dL — ABNORMAL HIGH (ref 70–99)
Glucose-Capillary: 193 mg/dL — ABNORMAL HIGH (ref 70–99)
Glucose-Capillary: 217 mg/dL — ABNORMAL HIGH (ref 70–99)

## 2023-10-08 LAB — CBC
HCT: 36.3 % — ABNORMAL LOW (ref 39.0–52.0)
Hemoglobin: 13 g/dL (ref 13.0–17.0)
MCH: 32.3 pg (ref 26.0–34.0)
MCHC: 35.8 g/dL (ref 30.0–36.0)
MCV: 90.1 fL (ref 80.0–100.0)
Platelets: 180 10*3/uL (ref 150–400)
RBC: 4.03 MIL/uL — ABNORMAL LOW (ref 4.22–5.81)
RDW: 12 % (ref 11.5–15.5)
WBC: 9.4 10*3/uL (ref 4.0–10.5)
nRBC: 0 % (ref 0.0–0.2)

## 2023-10-08 LAB — HEMOGLOBIN A1C
Hgb A1c MFr Bld: 8.4 % — ABNORMAL HIGH (ref 4.8–5.6)
Mean Plasma Glucose: 194.38 mg/dL

## 2023-10-08 LAB — HIV ANTIBODY (ROUTINE TESTING W REFLEX): HIV Screen 4th Generation wRfx: NONREACTIVE

## 2023-10-08 MED ORDER — OSMOLITE 1.5 CAL PO LIQD
996.0000 mL | ORAL | Status: DC
  Administered 2023-10-09: 1000 mL
  Filled 2023-10-08 (×2): qty 1000

## 2023-10-08 MED ORDER — SODIUM CHLORIDE 0.9 % IV BOLUS
1000.0000 mL | Freq: Once | INTRAVENOUS | Status: AC
  Administered 2023-10-08: 1000 mL via INTRAVENOUS

## 2023-10-08 MED ORDER — THIAMINE MONONITRATE 100 MG PO TABS
100.0000 mg | ORAL_TABLET | Freq: Every day | ORAL | Status: DC
  Administered 2023-10-08: 100 mg
  Filled 2023-10-08: qty 1

## 2023-10-08 MED ORDER — KETOROLAC TROMETHAMINE 15 MG/ML IJ SOLN
30.0000 mg | Freq: Four times a day (QID) | INTRAMUSCULAR | Status: AC
  Administered 2023-10-08 – 2023-10-13 (×19): 30 mg via INTRAVENOUS
  Filled 2023-10-08 (×21): qty 2

## 2023-10-08 MED ORDER — OXYCODONE HCL 5 MG PO TABS
5.0000 mg | ORAL_TABLET | ORAL | Status: DC | PRN
Start: 2023-10-08 — End: 2023-10-19
  Administered 2023-10-08 (×2): 10 mg via ORAL
  Administered 2023-10-09 (×4): 5 mg via ORAL
  Administered 2023-10-13: 10 mg via ORAL
  Administered 2023-10-14 – 2023-10-15 (×2): 5 mg via ORAL
  Administered 2023-10-15 – 2023-10-17 (×9): 10 mg via ORAL
  Administered 2023-10-17: 5 mg via ORAL
  Administered 2023-10-18 – 2023-10-19 (×2): 10 mg via ORAL
  Filled 2023-10-08: qty 2
  Filled 2023-10-08: qty 1
  Filled 2023-10-08 (×2): qty 2
  Filled 2023-10-08 (×2): qty 1
  Filled 2023-10-08 (×2): qty 2
  Filled 2023-10-08: qty 1
  Filled 2023-10-08: qty 2
  Filled 2023-10-08: qty 1
  Filled 2023-10-08 (×2): qty 2
  Filled 2023-10-08: qty 1
  Filled 2023-10-08: qty 2
  Filled 2023-10-08: qty 1
  Filled 2023-10-08 (×5): qty 2

## 2023-10-08 MED ORDER — PROSOURCE TF20 ENFIT COMPATIBL EN LIQD
60.0000 mL | Freq: Every day | ENTERAL | Status: DC
  Administered 2023-10-08: 60 mL
  Filled 2023-10-08: qty 60

## 2023-10-08 MED ORDER — PIVOT 1.5 CAL PO LIQD: 1000.0000 mL | ORAL | Status: DC

## 2023-10-08 MED ORDER — LEVETIRACETAM IN NACL 500 MG/100ML IV SOLN: 500.0000 mg | Freq: Two times a day (BID) | INTRAVENOUS | Status: DC

## 2023-10-08 MED ORDER — TRAMADOL HCL 50 MG PO TABS: 50.0000 mg | ORAL_TABLET | Freq: Four times a day (QID) | ORAL | Status: DC

## 2023-10-08 MED ORDER — OSMOLITE 1.5 CAL PO LIQD: 1000.0000 mL | ORAL | Status: DC

## 2023-10-08 MED ORDER — ENOXAPARIN SODIUM 30 MG/0.3ML IJ SOSY
30.0000 mg | PREFILLED_SYRINGE | Freq: Two times a day (BID) | INTRAMUSCULAR | Status: DC
  Administered 2023-10-09 – 2023-10-12 (×8): 30 mg via SUBCUTANEOUS
  Filled 2023-10-08 (×9): qty 0.3

## 2023-10-08 MED ORDER — QUETIAPINE FUMARATE 25 MG PO TABS
50.0000 mg | ORAL_TABLET | Freq: Two times a day (BID) | ORAL | Status: DC
  Administered 2023-10-08 – 2023-10-15 (×12): 50 mg via ORAL
  Filled 2023-10-08 (×4): qty 2
  Filled 2023-10-08: qty 1
  Filled 2023-10-08 (×8): qty 2
  Filled 2023-10-08: qty 1

## 2023-10-08 MED ORDER — OSMOLITE 1.5 CAL PO LIQD: 996.0000 mL | ORAL | Status: DC

## 2023-10-08 NOTE — Progress Notes (Signed)
Inpatient Rehab Admissions Coordinator:  ? ?Per therapy recommendations,  patient was screened for CIR candidacy by Devaney Segers, MS, CCC-SLP. At this time, Pt. Appears to be a a potential candidate for CIR. I will place   order for rehab consult per protocol for full assessment. Please contact me any with questions. ? ?Trine Fread, MS, CCC-SLP ?Rehab Admissions Coordinator  ?[phone] (celll) ?[phone] (office) ? ?

## 2023-10-08 NOTE — Evaluation (Signed)
Occupational Therapy Evaluation Patient Details Name: Charles Dorsey MRN: [id number] DOB: 12/27/1960 Today's Date: 10/08/2023   History of Present Illness Patient is a 62 yo male presents to [facility] on 11/10 with fall from cart while staining a deck, +ETOH. Pt sustained traumatic R SAH, R SDH,  L temporal bone fx, L TMJ fx. PMH includes DMII, HLD, coronary stent 2021.   Clinical Impression   Pt currently at mod assist level for simulated selfcare sit to stand and functional transfers simulated to the toilet.  Increased LOB in multiple directions while standing and during mobility.  Increased dizziness noted in sitting with BP at 103/69 and in standing pt slightly hypotensive at 83/57.  Pt lives with his spouse and daughter who both work different shifts.  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  Feel he will benefit from acute care OT at this time to help increase independence back to supervision level for return home with PRN assist from family initially.  Feel pt will benefit from intensive inpatient follow up therapy, >3 hours/day post acute stay.         If plan is discharge home, recommend the following: Direct supervision/assist for medications management;Direct supervision/assist for financial management;Help with stairs or ramp for entrance;Assist for transportation;Assistance with cooking/housework;A little help with bathing/dressing/bathroom;A little help with walking and/or transfers    Functional Status Assessment  Patient has had a recent decline in their functional status and demonstrates the ability to make significant improvements in function in a reasonable and predictable amount of time.  Equipment Recommendations  Other (comment) (TBD next visit/next venue of care)    Recommendations for Other Services Rehab consult     Precautions / Restrictions Precautions Precautions: Fall Restrictions Weight Bearing Restrictions: No       Mobility Bed Mobility Overal bed mobility: Needs Assistance Bed Mobility: Supine to Sit, Sit to Supine     Supine to sit: Contact guard, HOB elevated Sit to supine: Contact guard assist        Transfers Overall transfer level: Needs assistance Equipment used: None Transfers: Sit to/from Stand, Bed to chair/wheelchair/BSC Sit to Stand: Mod assist     Step pivot transfers: Mod assist     General transfer comment: Mod assist for standing balance with increased swaying and LOB in most directions.  Slight improvement with this during mobility with hand held assist.      Balance Overall balance assessment: Needs assistance Sitting-balance support: Single extremity supported Sitting balance-Leahy Scale: Fair     Standing balance support: Single extremity supported, During functional activity Standing balance-Leahy Scale: Poor                             ADL either performed or assessed with clinical judgement   ADL Overall ADL's : Needs assistance/impaired     Grooming: Set up;Sitting   Upper Body Bathing: Set up;Sitting   Lower Body Bathing: Moderate assistance;Sit to/from stand       Lower Body Dressing: Moderate assistance;Sit to/from stand   Toilet Transfer: Moderate assistance;Ambulation   Toileting- Clothing Manipulation and Hygiene: Moderate assistance;Sit to/from stand       Functional mobility during ADLs: Moderate assistance (ambulation without an assistive device) General ADL Comments: Pt currently with increased dizziness with positional change to sitting and standing.  BP in sitting 103/69 and then in standing 83/57.  Once back in bed with HOB elevated above  30 degrees it was 121/69.  Increased LOB in most directions with standing.     Vision Baseline Vision/History: 1 Wears glasses (wears glasses for near vision) Ability to See in Adequate Light: 0 Adequate Patient Visual Report: No change from baseline Vision Assessment?:  Yes;Vision impaired- to be further tested in functional context Alignment/Gaze Preference: Gaze right (slight gaze right of midline but scans spontaneous to the left) Tracking/Visual Pursuits: Decreased smoothness of vertical tracking;Decreased smoothness of horizontal tracking Additional Comments: Pt with jerky tracking to the right and then overshooting and jerky when tracking to the left.  Increased dizziness reported with sit to supine and sit to stand.     Perception Perception: Not tested       Praxis Praxis: WFL       Pertinent Vitals/Pain Pain Assessment Pain Assessment: Faces Faces Pain Scale: Hurts a little bit Pain Location: headache Pain Descriptors / Indicators: Aching Pain Intervention(s): Limited activity within patient's tolerance, Monitored during session     Extremity/Trunk Assessment Upper Extremity Assessment Upper Extremity Assessment: Overall WFL for tasks assessed   Lower Extremity Assessment Lower Extremity Assessment: Defer to PT evaluation   Cervical / Trunk Assessment Cervical / Trunk Assessment: Normal   Communication     Cognition Arousal: Alert Behavior During Therapy: WFL for tasks assessed/performed Overall Cognitive Status: Impaired/Different from baseline Area of Impairment: Awareness                           Awareness: Anticipatory   General Comments: Pt able to state reason for hospitalization although he doesn't remember falling.  Decreased ability to state the holiday in November and unable to state deficits from fall unless given mod questioning cueing.                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  He likes to build stuff at home, deck, storage shed      Prior Functioning/Environment Prior Level of Function : Independent/Modified Independent                        OT Problem List: Impaired balance (sitting and/or standing);Decreased safety awareness;Impaired vision/perception;Decreased activity tolerance;Decreased knowledge of use of DME or AE;Pain;Decreased cognition      OT Treatment/Interventions: Self-care/ADL training;Patient/family education;Neuromuscular education;Visual/perceptual remediation/compensation;Cognitive remediation/compensation;Therapeutic activities;DME and/or AE instruction;Balance training    OT Goals(Current goals can be found in the care plan section) Acute Rehab OT Goals Patient Stated Goal: Pt did not state but agreeable to participation in therapy OT Goal Formulation: With patient Time For Goal Achievement: 10/22/23 Potential to Achieve Goals: Good  OT Frequency: Min 1X/week    Co-evaluation PT/OT/SLP Co-Evaluation/Treatment: Yes Reason for Co-Treatment: Complexity of the patient's impairments (multi-system involvement);For patient/therapist safety   OT goals addressed during session: ADL's and self-care      AM-PAC OT "6 Clicks" Daily Activity     Outcome Measure Help from another person eating meals?: None Help from another person taking care of  personal grooming?: A Little Help from another person toileting, which includes using toliet, bedpan, or urinal?: A Lot Help from another person bathing (including washing, rinsing, drying)?: A Lot Help from another person to put on and taking off regular upper body clothing?: A Little Help from another person to put on and taking off regular lower body clothing?: A  Lot 6 Click Score: 16   End of Session Equipment Utilized During Treatment: Gait belt Nurse Communication: Mobility status  Activity Tolerance: Patient tolerated treatment well Patient left: in bed;with call bell/phone within reach;with bed alarm set  OT Visit Diagnosis: Unsteadiness on feet (R26.81);Other abnormalities of gait and mobility (R26.89);Repeated falls (R29.6);Muscle weakness (generalized) (M62.81);Pain Pain - Right/Left:  (headache)                Time: 3086-5784 OT Time Calculation (min): 30 min Charges:  OT General Charges $OT Visit: 1 Visit OT Evaluation $OT Eval Moderate Complexity: 1 Mod Perrin Maltese, OTR/L Acute Rehabilitation Services  Office [phone] 10/08/2023

## 2023-10-08 NOTE — Procedures (Signed)
Cortrak  Person Inserting Tube:  Mahala Menghini, RD Tube Type:  Cortrak - 43 inches Tube Size:  10 Tube Location:  Left nare Secured by: Bridle Technique Used to Measure Tube Placement:  Marking at nare/corner of mouth Cortrak Secured At:  70 cm   Cortrak Tube Team Note:  Consult received to place a Cortrak feeding tube.   X-ray is required, abdominal x-ray has been ordered by the Cortrak team. Please confirm tube placement before using the Cortrak tube.   If the tube becomes dislodged please keep the tube and contact the Cortrak team at [email or web address] for replacement.  If after hours and replacement cannot be delayed, place a NG tube and confirm placement with an abdominal x-ray.    Mertie Clause, MS, RD, LDN Registered Dietitian II Please see AMiON for contact information.

## 2023-10-08 NOTE — Inpatient Diabetes Management (Signed)
Inpatient Diabetes Program Recommendations  AACE/ADA: New Consensus Statement on Inpatient Glycemic Control (2015)  Target Ranges:  Prepandial:   less than 140 mg/dL      Peak postprandial:   less than 180 mg/dL (1-2 hours)      Critically ill patients:  140 - 180 mg/dL   Lab Results  Component Value Date   GLUCAP 191 (H) 10/08/2023   HGBA1C 8.4 (H) 10/08/2023    Review of Glycemic Control  Latest Reference Range & Units 10/07/23 21:12 10/07/23 23:45 10/08/23 03:51 10/08/23 07:38 10/08/23 11:47  Glucose-Capillary 70 - 99 mg/dL 161 (H) 096 (H) 045 (H) 193 (H) 191 (H)  (H): Data is abnormally high  Diabetes history: DM2 Outpatient Diabetes medications: None Current orders for Inpatient glycemic control: Novolog 0-9 units Q4H  Inpatient Diabetes Program Recommendations:    Diet ordered at noon today.  Once eating well, might consider:  Novolog 0-15 units TID and 0-5 units at bedtime.    Will continue to follow while inpatient.  Thank you, Dulce Sellar, MSN, CDCES Diabetes Coordinator Inpatient Diabetes Program [phone] (team pager from 8a-5p)

## 2023-10-08 NOTE — Progress Notes (Signed)
Patient ID: Charles Dorsey, male   DOB: 08/03/1961, 62 y.o.   MRN: [id number] BP 108/68 Comment: [place] okay with pressure  Pulse (!) 59   Temp 97.9 F (36.6 C) (Axillary)   Resp 14   Ht 5\' 8"  (1.727 m)   Wt 61.2 kg   SpO2 97%   BMI 20.53 kg/m  Alert and following commands. Still with facial asymmetry with activation of mimetic musculature.  Moving all extremities Head CT unchanged No new recommendations, no need to repeat head CT.

## 2023-10-08 NOTE — Progress Notes (Signed)
Initial Nutrition Assessment  DOCUMENTATION CODES:   Non-severe (moderate) malnutrition in context of chronic illness  INTERVENTION:   Initiate tube feeding via Cortrak tube: Osmolite 1.5 at 83 ml/h x 12 hours  Run from 1800 - 0600 (996 ml per day)  Provides 1494 kcal, 62 gm protein, 756 ml free water daily  Monitor magnesium and phosphorus every 12 hours x 4 occurrences, MD to replete as needed, as pt is at risk for refeeding syndrome given pt meets criteria for moderate malnutrition.   MVI with minerals daily  100 mg thiamine daily x 7 days   Encourage PO intake will supplement as appropriate.   NUTRITION DIAGNOSIS:   Moderate Malnutrition related to chronic illness (uncontrolled DM and ETOH use) as evidenced by moderate muscle depletion, moderate fat depletion.  GOAL:   Patient will meet greater than or equal to 90% of their needs  MONITOR:   PO intake, TF tolerance  REASON FOR ASSESSMENT:   Consult Enteral/tube feeding initiation and management  ASSESSMENT:   Pt with PMH of DM, HLD, smoker, and daily ETOH use admitted after fall with TBI, SDH, SAH, L temporal bone fx, and L TMJ fx.   Pt discussed during ICU rounds and with RN.  Spoke with pt, daughter, and wife who were at bedside.  Dtr and wife provide most of hx. Pt eats 2 meals per day. Breakfast is noodles and meat in soup. Usually skips lunch, Dinner is rice, veggies, meat dry or in soup Pt drinks 2 cups of dark liquor each day throughout the day. +ETOH at time of fall. Per daughter pt does not take his DM medications. Complains that metformin give his diarrhea.   After speaking with family and attending rounds was notified that pt was able to pass for a Regular diet with thin liquids.  Cortrak in place and pt on CIWA protocol. Will start nocturnal TF and monitor intake. TF can be adjusted based on intake.   11/11 - s/p cortrak placement, xray pending  Medications reviewed and include: colace, SSI every  4 hours NS @ 75 ml/hr Precedex   Labs reviewed:  A1C: 8.4 CBG's: 193-220 (NPO) ETOH: 62  NUTRITION - FOCUSED PHYSICAL EXAM:  Flowsheet Row Most Recent Value  Orbital Region No depletion  Upper Arm Region Mild depletion  Thoracic and Lumbar Region Moderate depletion  Buccal Region Mild depletion  Temple Region Unable to assess  [facial swelling]  Clavicle Bone Region No depletion  Clavicle and Acromion Bone Region No depletion  Scapular Bone Region Unable to assess  Dorsal Hand No depletion  Patellar Region Moderate depletion  Anterior Thigh Region Moderate depletion  Posterior Calf Region Mild depletion  Edema (RD Assessment) None  Hair Reviewed  Eyes Reviewed  Mouth Reviewed  Skin Reviewed  Nails Reviewed       Diet Order:   Diet Order             Diet regular Room service appropriate? Yes with Assist; Fluid consistency: Thin  Diet effective now                   EDUCATION NEEDS:   Not appropriate for education at this time  Skin:  Skin Assessment: Reviewed RN Assessment  Last BM:  11/10  Height:   Ht Readings from Last 1 Encounters:  10/07/23 5\' 8"  (1.727 m)    Weight:   Wt Readings from Last 1 Encounters:  10/07/23 61.2 kg    BMI:  Body mass index  is 20.53 kg/m.  Estimated Nutritional Needs:   Kcal:  1900-2100  Protein:  90-120 grams  Fluid:  >1.9 L/day  Cammy Copa., RD, LDN, CNSC See AMiON for contact information

## 2023-10-08 NOTE — Evaluation (Signed)
Speech Language Pathology Evaluation Patient Details Name: Charles Dorsey MRN: [id number] DOB: 13-Jan-1961 Today's Date: 10/08/2023 Time: 4098-1191 SLP Time Calculation (min) (ACUTE ONLY): 13 min  Problem List:  Patient Active Problem List   Diagnosis Date Noted   SDH (subdural hematoma) (HCC) 10/07/2023   Closed fracture of temporal bone (HCC) 10/07/2023   Coronary artery disease of native artery of native heart with stable angina pectoris (HCC) 12/25/2022   Type 1 diabetes mellitus without complication (HCC) 09/11/2020   Essential hypertension 09/11/2020   Unstable angina (HCC) 03/18/2020   Family history of early CAD 03/18/2020   Hyperlipidemia 03/18/2020   Post PTCA 03/18/2020   Exertional chest pain 03/10/2020   Past Medical History:  Past Medical History:  Diagnosis Date   Acid reflux    Diabetes mellitus without complication (HCC)    Hyperlipidemia    Hypothyroidism    Past Surgical History:  Past Surgical History:  Procedure Laterality Date   CORONARY STENT INTERVENTION N/A 03/18/2020   Procedure: CORONARY STENT INTERVENTION;  Surgeon: Elder Negus, MD;  Location: [facility] INVAS[facility];  Service: Cardiovascular;  Laterality: N/A;   CORONARY ULTRASOUND/IVUS N/A 03/18/2020   Procedure: Intravascular Ultrasound/IVUS;  Surgeon: Elder Negus, MD;  Location: [facility] INVASIVE CV LAB;  Service: Cardiovascular;  Laterality: N/A;   LEFT HEART CATH AND CORONARY ANGIOGRAPHY N/A 03/18/2020   Procedure: LEFT HEART CATH AND CORONARY ANGIOGRAPHY;  Surgeon: Elder Negus, MD;  Location: [facility] INVASIVE CV LAB;  Service: Cardiovascular;  Laterality: N/A;   HPI:  62 yo male [facility] to [facility] 11/[facility] with fall from cart while staining a deck, +ETOH. Pt sustained traumatic R SAH, R SDH,  L temporal bone fx, L TMJ fx. PMH includes DMII, HLD, coronary stent 2021.   Assessment / Plan / Recommendation Clinical Impression  Pt participated in cognitive-linguistic assessment. Presents with  impulsivity, impaired selective attention, storage of new information, and working memory; fluctuating insight into situation. Speech is clear and fluent. Expressive and receptive language are WNL. Behavior most consistent with Rancho levels VI-VII.  Wife and daughter are supportive. 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 He was able to engage in assessment  and demonstrate understanding without the need of an interpreter.  SLP will follow to address aforementioned deficits.    SLP Assessment  SLP Recommendation/Assessment: Patient needs continued Speech Lanaguage Pathology Services SLP Visit Diagnosis: Cognitive communication deficit (R41.841)    Recommendations for follow up therapy are one component of a multi-disciplinary discharge planning process, led by the attending physician.  Recommendations may be updated based on patient status, additional functional criteria and insurance authorization.    Follow Up Recommendations  No SLP follow up    Assistance Recommended at Discharge  Intermittent Supervision/Assistance  Functional Status Assessment Patient has had a recent decline in their functional status and demonstrates the ability to make significant improvements in function in a reasonable and predictable amount of time.  Frequency and Duration min 2x/week  1 week      SLP Evaluation Cognition  Overall Cognitive Status: Impaired/Different from baseline Arousal/Alertness: Awake/alert Orientation Level: Oriented to person;Disoriented to place;Disoriented to time Attention: Sustained;Selective Sustained Attention: Appears intact Selective Attention: Impaired Selective Attention Impairment: Verbal basic Memory: Impaired Memory Impairment: Retrieval deficit Awareness: Impaired Awareness Impairment: Emergent impairment Behaviors: Impulsive Safety/Judgment: Impaired Ranch[place] Mirant Scales of Cognitive Functioning: Confused,  Appropriate: Moderate Assistance       Comprehension  Auditory  Comprehension Overall Auditory Comprehension: Appears within functional limits for tasks assessed Visual Recognition/Discrimination Discrimination: Within Function Limits Reading Comprehension Reading Status: Not tested    Expression Expression Primary Mode of Expression: Verbal Verbal Expression Overall Verbal Expression: Appears within functional limits for tasks assessed Written Expression Dominant Hand: Right Written Expression: Not tested   Oral / Motor  Oral Motor/Sensory Function Overall Oral Motor/Sensory Function: Mild impairment Facial Symmetry: Abnormal symmetry left;Suspected CN VII (facial) dysfunction Lingual Symmetry: Within Functional Limits Motor Speech Overall Motor Speech: Appears within functional limits for tasks assessed            Blenda Mounts Laurice 10/08/2023, 12:34 PM Marchelle Folks L. Samson Frederic, MA CCC/SLP Clinical Specialist - Acute Care SLP Acute Rehabilitation Services Office number [phone]

## 2023-10-08 NOTE — Evaluation (Signed)
Physical Therapy Evaluation Patient Details Name: Charles Dorsey MRN: [id number] DOB: 20-Apr-1961 Today's Date: 10/08/2023  History of Present Illness  Patient is a 62 yo male presents to [facility] on 11/10 with fall from cart while staining a deck, +ETOH. Pt sustained traumatic R SAH, R SDH,  L temporal bone fx, L TMJ fx. PMH includes DMII, HLD, coronary stent 2021.  Clinical Impression   Pt presents with generalized weakness, ataxic gait, impaired activity tolerance, poor standing balance with LOB, questionable L inattention vs vestibular involvement (will require further testing). Pt to benefit from acute PT to address deficits. Pt ambulated room distance, overall requiring mod PT assist for steadying, balance, room navigation. PT to progress mobility as tolerated, and will continue to follow acutely.          If plan is discharge home, recommend the following: A lot of help with walking and/or transfers;A lot of help with bathing/dressing/bathroom   Can travel by private vehicle        Equipment Recommendations None recommended by PT  Recommendations for Other Services       Functional Status Assessment Patient has had a recent decline in their functional status and demonstrates the ability to make significant improvements in function in a reasonable and predictable amount of time.     Precautions / Restrictions Precautions Precautions: Fall Restrictions Weight Bearing Restrictions: No      Mobility  Bed Mobility Overal bed mobility: Needs Assistance Bed Mobility: Supine to Sit, Sit to Supine     Supine to sit: Min assist Sit to supine: Mod assist   General bed mobility comments: assist for steadying once sitting EOB, and LE lift/trunk lower when returning to supine    Transfers Overall transfer level: Needs assistance Equipment used: 1 person hand held assist Transfers: Sit to/from Stand Sit to Stand: Mod assist           General transfer comment: assist for rise  and steady, pt with LOB once standing given dizziness and unsteadiness    Ambulation/Gait Ambulation/Gait assistance: Min assist Gait Distance (Feet): 35 Feet Assistive device: 1 person hand held assist Gait Pattern/deviations: Step-through pattern, Decreased stride length, Ataxic, Narrow base of support, Wide base of support Gait velocity: decr     General Gait Details: assist to steady, guide pt, navigate room. ataxic gait and reports dizziness  Stairs            Wheelchair Mobility     Tilt Bed    Modified Rankin (Stroke Patients Only)       Balance Overall balance assessment: Needs assistance Sitting-balance support: Single extremity supported Sitting balance-Leahy Scale: Fair     Standing balance support: Single extremity supported, During functional activity Standing balance-Leahy Scale: Poor                               Pertinent Vitals/Pain Pain Assessment Pain Assessment: Faces Faces Pain Scale: Hurts little more Pain Location: head Pain Descriptors / Indicators: Headache Pain Intervention(s): Limited activity within patient's tolerance, Monitored during session, Repositioned    Home Living Family/patient expects to be discharged to:: Private residence Living Arrangements: Spouse/significant other;Children (daughter - both pt's wife and daughter work first shift and pt works second shift) Available Help at Discharge: Available PRN/intermittently Type of Home: House Home Access: Stairs to enter   [employer] of Steps: One step down after walking into the house   Home Layout: Two level;Able to  live on main level with bedroom/bathroom Home Equipment: None Additional Comments: 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  He likes to build stuff at home, deck, storage shed    Prior Function Prior Level of Function : Independent/Modified Independent                     Extremity/Trunk Assessment    Upper Extremity Assessment Upper Extremity Assessment: Defer to OT evaluation    Lower Extremity Assessment Lower Extremity Assessment: Generalized weakness    Cervical / Trunk Assessment Cervical / Trunk Assessment: Normal  Communication   Communication Communication: Difficulty communicating thoughts/reduced clarity of speech  Cognition Arousal: Alert Behavior During Therapy: WFL for tasks assessed/performed Overall Cognitive Status: Impaired/Different from baseline Area of Impairment: Awareness               Rancho Levels of Cognitive Functioning Rancho Mirant Scales of Cognitive Functioning: Confused, Appropriate: Moderate Assistance           Awareness: Anticipatory   General Comments: Pt able to state reason for hospitalization although he doesn't remember falling.  Decreased ability to state the holiday in November and unable to state deficits from fall unless given mod questioning cueing.   Rancho Mirant Scales of Cognitive Functioning: Confused, Appropriate: Moderate Assistance [VI]    General Comments General comments (skin integrity, edema, etc.): BP drop to 80s/50s post-gait, return to supine. Question nystagmus with returning to supine, will need to look at vestibular further    Exercises     Assessment/Plan    PT Assessment Patient needs continued PT services  PT Problem List Decreased strength;Decreased mobility;Decreased activity tolerance;Decreased balance;Decreased knowledge of use of DME;Pain;Decreased safety awareness;Decreased knowledge of precautions;Decreased cognition       PT Treatment Interventions DME instruction;Therapeutic activities;Gait training;Therapeutic exercise;Patient/family education;Balance training;Stair training;Functional mobility training;Neuromuscular re-education    PT Goals (Current goals can be found in the Care Plan section)  Acute Rehab PT Goals Patient Stated Goal: home PT Goal Formulation: With  patient Time For Goal Achievement: 10/22/23 Potential to Achieve Goals: Good    Frequency Min 1X/week     Co-evaluation   Reason for Co-Treatment: Complexity of the patient's impairments (multi-system involvement);For patient/therapist safety   OT goals addressed during session: ADL's and self-care       AM-PAC PT "6 Clicks" Mobility  Outcome Measure Help needed turning from your back to your side while in a flat bed without using bedrails?: A Little Help needed moving from lying on your back to sitting on the side of a flat bed without using bedrails?: A Little Help needed moving to and from a bed to a chair (including a wheelchair)?: A Lot Help needed standing up from a chair using your arms (e.g., wheelchair or bedside chair)?: A Lot Help needed to walk in hospital room?: A Lot Help needed climbing 3-5 steps with a railing? : Total 6 Click Score: 13    End of Session Equipment Utilized During Treatment: Gait belt Activity Tolerance: Patient tolerated treatment well Patient left: in bed;with call bell/phone within reach;with bed alarm set;with restraints reapplied;with SCD's reapplied;with family/visitor present (wrist restraints bilat) Nurse Communication: Mobility status PT Visit Diagnosis: Other abnormalities of gait and mobility (R26.89);Muscle weakness (generalized) (M62.81)    Time: 8756-4332 PT Time Calculation (min) (ACUTE ONLY): 39 min   Charges:   PT Evaluation $PT Eval Low Complexity: 1 Low   PT General Charges $$ ACUTE PT VISIT: 1 Visit  Marye Round, PT DPT Acute Rehabilitation Services Secure Chat Preferred  Office [phone]   Truddie Coco 10/08/2023, 3:40 PM

## 2023-10-08 NOTE — Evaluation (Signed)
Clinical/Bedside Swallow Evaluation Patient Details  Name: Charles Dorsey MRN: [id number] Date of Birth: 1961-01-30  Today's Date: 10/08/2023 Time: SLP Start Time (ACUTE ONLY): 1147 SLP Stop Time (ACUTE ONLY): 1157 SLP Time Calculation (min) (ACUTE ONLY): 10 min  Past Medical History:  Past Medical History:  Diagnosis Date   Acid reflux    Diabetes mellitus without complication (HCC)    Hyperlipidemia    Hypothyroidism    Past Surgical History:  Past Surgical History:  Procedure Laterality Date   CORONARY STENT INTERVENTION N/A 03/18/2020   Procedure: CORONARY STENT INTERVENTION;  Surgeon: Elder Negus, MD;  Location: [facility];  Service: Cardiovascular;  Laterality: N/A;   CORONARY ULTRASOUND/IVUS N/A 03/18/2020   Procedure: Intravascular Ultrasound/IVUS;  Surgeon: Elder Negus, MD;  Location: [facility] INVAS[facility] CV LAB;  Service: Cardiovascular;  Laterality: N/A;   LEFT HEART CATH AND CORONARY ANGIOGRAPHY N/A 03/18/2020   Procedure: LEFT HEART CATH AND CORONARY ANGIOGRAPHY;  Surgeon: Elder Negus, MD;  Location: [facility] IN[facility] CV [facility];  Service: Cardiovascular;  Laterality: N/A;   HPI:  62 yo male [facility] with fall from cart while staining a deck, +ETOH. Pt sustained traumatic R SAH, R SDH,  L temporal bone fx, L TMJ fx. PMH includes DMII, HLD, coronary stent 2021. His daughter and wife report frequent belching after drinking fluids at baseline. Hx includes acid reflux.    Assessment / Plan / Recommendation  Clinical Impression  Pt participated in clinical swallowing assessment.  Demonstrated no s/s of an oropharyngeal dysphagia - there was thorough mastication, palpable swallow response, and no concern for compromised airway protection.  Voice was clear and of good quality throughout.  He did present with consistent belching after sips of thin liquid - his wife and daughter report that he does this frequently at home.  Symptoms are esophageal in nature  - if they persist or create discomfort, he may need esophageal w/u.  For now, start regular diet/thin liquids.  Cortrak is present. Will defer to MD and RD re: plan for its use. There are no further SLP f/u needs identified with regard to swallowing.  SLP Visit Diagnosis: Dysphagia, unspecified (R13.10)    Aspiration Risk  No limitations    Diet Recommendation   Thin;Age appropriate regular  Medication Administration: Whole meds with liquid    Other  Recommendations Recommended Consults: Consider esophageal assessment Oral Care Recommendations: Oral care BID    Recommendations for follow up therapy are one component of a multi-disciplinary discharge planning process, led by the attending physician.  Recommendations may be updated based on patient status, additional functional criteria and insurance authorization.  Follow up Recommendations No SLP follow up        Swallow Study   General Date of Onset: 10/07/23 HPI: 62 yo male presents to [facility] on 11/10 with fall from cart while staining a deck, +ETOH. Pt sustained traumatic R SAH, R SDH,  L temporal bone fx, L TMJ fx. PMH includes DMII, HLD, coronary stent 2021. His daughter and wife report frequent belching after drinking fluids at baseline. Type of Study: Bedside Swallow Evaluation Previous Swallow Assessment: no Diet Prior to this Study: NPO Temperature Spikes Noted: No Respiratory Status: Room air History of Recent Intubation: No Behavior/Cognition: Alert;Cooperative Oral Cavity Assessment: Within Functional Limits Oral Care Completed by SLP: No Oral Cavity - Dentition: Adequate natural dentition Vision: Functional for self-feeding Self-Feeding Abilities: Able to feed self Patient Positioning: Upright in bed Baseline Vocal Quality:  Normal Volitional Cough: Strong Volitional Swallow: Able to elicit    Oral/Motor/Sensory Function Overall Oral Motor/Sensory Function: Mild impairment Facial Symmetry: Abnormal symmetry  left;Suspected CN VII (facial) dysfunction Lingual Symmetry: Within Functional Limits   Ice Chips Ice chips: Within functional limits   Thin Liquid Thin Liquid: Within functional limits    Nectar Thick Nectar Thick Liquid: Not tested   Honey Thick Honey Thick Liquid: Not tested   Puree Puree: Within functional limits   Solid     Solid: Within functional limits      Blenda Mounts Laurice 10/08/2023,12:21 PM  Marchelle Folks L. Samson Frederic, MA CCC/SLP Clinical Specialist - Acute Care SLP Acute Rehabilitation Services Office number [phone]

## 2023-10-08 NOTE — Progress Notes (Signed)
Trauma/Critical Care Follow Up Note  Subjective:    Overnight Issues:   Objective:  Vital signs for last 24 hours: Temp:  [97.1 F (36.2 C)-98.8 F (37.1 C)] 97.1 F (36.2 C) (11/11 0800) Pulse Rate:  [65-95] 68 (11/11 1000) Resp:  [14-23] 16 (11/11 1000) BP: (110-168)/(67-109) 131/81 (11/11 1000) SpO2:  [93 %-100 %] 96 % (11/11 1000) Weight:  [61.2 kg] 61.2 kg (11/10 1517)  Hemodynamic parameters for last 24 hours:    Intake/Output from previous day: 11/10 0701 - 11/11 0700 In: 1791.9 [I.V.:973.6; IV Piggyback:818.3] Out: 300 [Urine:300]  Intake/Output this shift: Total I/O In: 418.1 [I.V.:222.2; IV Piggyback:195.9] Out: 125 [Urine:125]  Vent settings for last 24 hours:    Physical Exam:  Gen: comfortable, no distress Neuro: follows commands HEENT: PERRL Neck: supple CV: RRR Pulm: unlabored breathing on RA Abd: soft, NT    GU: urine clear and yellow, +spontaneous voids Extr: wwp, no edema  Results for orders placed or performed during the hospital encounter of 10/07/23 (from the past 24 hour(s))  Comprehensive metabolic panel     Status: Abnormal   Collection Time: 10/07/23  1:15 PM  Result Value Ref Range   Sodium 136 135 - 145 mmol/L   Potassium 3.6 3.5 - 5.1 mmol/L   Chloride 105 98 - 111 mmol/L   CO2 20 (L) 22 - 32 mmol/L   Glucose, Bld 238 (H) 70 - 99 mg/dL   BUN 16 6 - 20 mg/dL   Creatinine, Ser 9.56 0.61 - 1.24 mg/dL   Calcium 8.8 (L) 8.9 - 10.3 mg/dL   Total Protein 6.8 6.5 - 8.1 g/dL   Albumin 3.8 3.5 - 5.0 g/dL   AST 78 (H) 15 - 41 U/L   ALT 56 (H) 0 - 44 U/L   Alkaline Phosphatase 134 (H) 38 - 126 U/L   Total Bilirubin 1.0 <1.2 mg/dL   GFR, Estimated >21 >30 mL/min   Anion gap 11 5 - 15  CBC     Status: None   Collection Time: 10/07/23  1:15 PM  Result Value Ref Range   WBC 8.0 4.0 - 10.5 K/uL   RBC 4.80 4.22 - 5.81 MIL/uL   Hemoglobin 15.2 13.0 - 17.0 g/dL   HCT 86.5 78.4 - 69.6 %   MCV 90.0 80.0 - 100.0 fL   MCH 31.7 26.0 -  34.0 pg   MCHC 35.2 30.0 - 36.0 g/dL   RDW 29.5 28.4 - 13.2 %   Platelets 228 150 - 400 K/uL   nRBC 0.0 0.0 - 0.2 %  Ethanol     Status: Abnormal   Collection Time: 10/07/23  1:15 PM  Result Value Ref Range   Alcohol, Ethyl (B) 62 (H) <10 mg/dL  Protime-INR     Status: None   Collection Time: 10/07/23  1:15 PM  Result Value Ref Range   Prothrombin Time 12.7 11.4 - 15.2 seconds   INR 0.9 0.8 - 1.2  Sample to Blood Bank     Status: None   Collection Time: 10/07/23  1:15 PM  Result Value Ref Range   Blood Bank Specimen SAMPLE AVAILABLE FOR TESTING    Sample Expiration      10/10/2023,2359 Performed at [facility], [address], [place], [place] [address]   CBG monitoring, ED     Status: Abnormal   Collection Time: 10/07/23  1:18 PM  Result Value Ref Range   Glucose-Capillary 238 (H) 70 - 99 mg/dL  I-Stat  Chem 8, ED     Status: Abnormal   Collection Time: 10/07/23  1:23 PM  Result Value Ref Range   Sodium 139 135 - 145 mmol/L   Potassium 3.5 3.5 - 5.1 mmol/L   Chloride 105 98 - 111 mmol/L   BUN 20 6 - 20 mg/dL   Creatinine, Ser 1.32 0.61 - 1.24 mg/dL   Glucose, Bld 440 (H) 70 - 99 mg/dL   Calcium, Ion 1.02 (L) 1.15 - 1.40 mmol/L   TCO2 21 (L) 22 - 32 mmol/L   Hemoglobin 15.6 13.0 - 17.0 g/dL   HCT 72.5 36.6 - 44.0 %  I-Stat Lactic Acid, ED     Status: Abnormal   Collection Time: 10/07/23  1:24 PM  Result Value Ref Range   Lactic Acid, Venous 3.4 (HH) 0.5 - 1.9 mmol/L   Comment NOTIFIED PHYSICIAN   MRSA Next Gen by PCR, Nasal     Status: None   Collection Time: 10/07/23  5:28 PM   Specimen: Nasal Mucosa; Nasal Swab  Result Value Ref Range   MRSA by PCR Next Gen NOT DETECTED NOT DETECTED  Glucose, capillary     Status: Abnormal   Collection Time: 10/07/23  9:12 PM  Result Value Ref Range   Glucose-Capillary 220 (H) 70 - 99 mg/dL  Glucose, capillary     Status: Abnormal   Collection Time: 10/07/23 11:45 PM  Result Value Ref Range   Glucose-Capillary 220 (H)  70 - 99 mg/dL  Glucose, capillary     Status: Abnormal   Collection Time: 10/08/23  3:51 AM  Result Value Ref Range   Glucose-Capillary 217 (H) 70 - 99 mg/dL  CBC     Status: Abnormal   Collection Time: 10/08/23  7:01 AM  Result Value Ref Range   WBC 9.4 4.0 - 10.5 K/uL   RBC 4.03 (L) 4.22 - 5.81 MIL/uL   Hemoglobin 13.0 13.0 - 17.0 g/dL   HCT 34.7 (L) 42.5 - 95.6 %   MCV 90.1 80.0 - 100.0 fL   MCH 32.3 26.0 - 34.0 pg   MCHC 35.8 30.0 - 36.0 g/dL   RDW 38.7 56.4 - 33.2 %   Platelets 180 150 - 400 K/uL   nRBC 0.0 0.0 - 0.2 %  Basic metabolic panel     Status: Abnormal   Collection Time: 10/08/23  7:01 AM  Result Value Ref Range   Sodium 138 135 - 145 mmol/L   Potassium 3.7 3.5 - 5.1 mmol/L   Chloride 110 98 - 111 mmol/L   CO2 22 22 - 32 mmol/L   Glucose, Bld 192 (H) 70 - 99 mg/dL   BUN 15 6 - 20 mg/dL   Creatinine, Ser 9.51 0.61 - 1.24 mg/dL   Calcium 8.0 (L) 8.9 - 10.3 mg/dL   GFR, Estimated >88 >41 mL/min   Anion gap 6 5 - 15  HIV Antibody (routine testing w rflx)     Status: None   Collection Time: 10/08/23  7:01 AM  Result Value Ref Range   HIV Screen 4th Generation wRfx Non Reactive Non Reactive  Hemoglobin A1c     Status: Abnormal   Collection Time: 10/08/23  7:01 AM  Result Value Ref Range   Hgb A1c MFr Bld 8.4 (H) 4.8 - 5.6 %   Mean Plasma Glucose 194.38 mg/dL  Glucose, capillary     Status: Abnormal   Collection Time: 10/08/23  7:38 AM  Result Value Ref Range   Glucose-Capillary 193 (H)  70 - 99 mg/dL    Assessment & Plan: The plan of care was discussed with the bedside nurse for the day, who is in agreement with this plan and no additional concerns were raised.   Present on Admission:  SDH (subdural hematoma) (HCC)    LOS: 1 day   Additional comments:I reviewed the patient's new clinical lab test results.   and I reviewed the patients new imaging test results.     Fall from a cart   TBI/SDH/SAH - NSGY c/s, Dr. Franky Macho, per Dr. Harold Hedge note 11/10  does not recommend Keppra, TBI team therapies, follow-up head CT this AM reviewed. Agitation - remains on precedex, added seroquel, oxy, toradol; wean dex Left temporal bone fracture, L TMJ FX -Dr. Elijah Birk recs for Ciprodex drops,  ETOH use daily - CIWA DM, HTN - home meds being held FEN - strict NPO, place cortrak and start TF,  DVT - SCDs, LMWH okay to start 11/12 per NSGY (ordered) Dispo - ICU   Critical Care Total Time: 35 minutes  Diamantina Monks, MD Trauma & General Surgery Please use [email or web address] to contact on call provider  10/08/2023  *Care during the described time interval was provided by me. I have reviewed this patient's available data, including medical history, events of note, physical examination and test results as part of my evaluation.

## 2023-10-09 DIAGNOSIS — E440 Moderate protein-calorie malnutrition: Secondary | ICD-10-CM | POA: Insufficient documentation

## 2023-10-09 LAB — CBC
HCT: 37.7 % — ABNORMAL LOW (ref 39.0–52.0)
Hemoglobin: 13 g/dL (ref 13.0–17.0)
MCH: 31.9 pg (ref 26.0–34.0)
MCHC: 34.5 g/dL (ref 30.0–36.0)
MCV: 92.4 fL (ref 80.0–100.0)
Platelets: 170 K/uL (ref 150–400)
RBC: 4.08 MIL/uL — ABNORMAL LOW (ref 4.22–5.81)
RDW: 12.3 % (ref 11.5–15.5)
WBC: 7 K/uL (ref 4.0–10.5)
nRBC: 0 % (ref 0.0–0.2)

## 2023-10-09 LAB — BASIC METABOLIC PANEL WITH GFR
Anion gap: 8 (ref 5–15)
BUN: 23 mg/dL — ABNORMAL HIGH (ref 6–20)
CO2: 20 mmol/L — ABNORMAL LOW (ref 22–32)
Calcium: 8.9 mg/dL (ref 8.9–10.3)
Chloride: 107 mmol/L (ref 98–111)
Creatinine, Ser: 0.84 mg/dL (ref 0.61–1.24)
GFR, Estimated: >60 mL/min
Glucose, Bld: 291 mg/dL — ABNORMAL HIGH (ref 70–99)
Potassium: 3.9 mmol/L (ref 3.5–5.1)
Sodium: 135 mmol/L (ref 135–145)

## 2023-10-09 LAB — GLUCOSE, CAPILLARY
Glucose-Capillary: 146 mg/dL — ABNORMAL HIGH (ref 70–99)
Glucose-Capillary: 158 mg/dL — ABNORMAL HIGH (ref 70–99)
Glucose-Capillary: 227 mg/dL — ABNORMAL HIGH (ref 70–99)
Glucose-Capillary: 250 mg/dL — ABNORMAL HIGH (ref 70–99)
Glucose-Capillary: 259 mg/dL — ABNORMAL HIGH (ref 70–99)
Glucose-Capillary: 295 mg/dL — ABNORMAL HIGH (ref 70–99)

## 2023-10-09 MED ORDER — INSULIN GLARGINE-YFGN 100 UNIT/ML ~~LOC~~ SOLN
10.0000 [IU] | Freq: Every day | SUBCUTANEOUS | Status: DC
  Administered 2023-10-09 – 2023-10-19 (×10): 10 [IU] via SUBCUTANEOUS
  Filled 2023-10-09 (×11): qty 0.1

## 2023-10-09 MED ORDER — THIAMINE MONONITRATE 100 MG PO TABS
100.0000 mg | ORAL_TABLET | Freq: Every day | ORAL | Status: AC
  Administered 2023-10-09 – 2023-10-14 (×5): 100 mg via ORAL
  Filled 2023-10-09 (×6): qty 1

## 2023-10-09 NOTE — Progress Notes (Addendum)
Patient ID: Charles Dorsey, male   DOB: Oct 30, 1961, 62 y.o.   MRN: [id number] Follow up - Trauma Critical Care   Patient Details:    Charles Dorsey is an 62 y.o. male.  Lines/tubes : External Urinary Catheter (Active)  Securement Method None needed 10/08/23 2000  Site Assessment Clean, Dry, Intact 10/08/23 2000  Intervention No interventions needed at this time 10/08/23 2000  Output (mL) 375 mL 10/09/23 0600    Microbiology/Sepsis markers: Results for orders placed or performed during the hospital encounter of 10/07/23  MRSA Next Gen by PCR, Nasal     Status: None   Collection Time: 10/07/23  5:28 PM   Specimen: Nasal Mucosa; Nasal Swab  Result Value Ref Range Status   MRSA by PCR Next Gen NOT DETECTED NOT DETECTED Final    Comment: (NOTE) The GeneXpert MRSA Assay (FDA approved for NASAL specimens only), is one component of a comprehensive MRSA colonization surveillance program. It is not intended to diagnose MRSA infection nor to guide or monitor treatment for MRSA infections. Test performance is not FDA approved in patients less than 9 years old. Performed at [facility], [address], [place], [place] [address]     Anti-infectives:  Anti-infectives (From admission, onward)    None      Consults: Treatment Team:  Coletta Memos, MD    Studies:    Events:  Subjective:    Overnight Issues: dex off at 530  Objective:  Vital signs for last 24 hours: Temp:  [97 F (36.1 C)-97.9 F (36.6 C)] 97.5 F (36.4 C) (11/12 0800) Pulse Rate:  [55-74] 67 (11/12 0800) Resp:  [11-19] 13 (11/12 0800) BP: (92-137)/(55-81) 127/72 (11/12 0800) SpO2:  [94 %-98 %] 97 % (11/12 0800) Weight:  [65.3 kg] 65.3 kg (11/12 0500)  Hemodynamic parameters for last 24 hours:    Intake/Output from previous day: 11/11 0701 - 11/12 0700 In: 2126.9 [P.O.:230; I.V.:1183.6; NG/GT:517.4; IV Piggyback:195.9] Out: 1375 [Urine:1375]  Intake/Output this shift: No intake/output data  recorded.  Vent settings for last 24 hours:    Physical Exam:  General: alert and calm Neuro: F/C, speech clear HEENT/Neck: no more bleeding from L ear Resp: clear to auscultation bilaterally CVS: RRR GI: soft, NT Extremities: calves soft  Results for orders placed or performed during the hospital encounter of 10/07/23 (from the past 24 hour(s))  Glucose, capillary     Status: Abnormal   Collection Time: 10/08/23 11:47 AM  Result Value Ref Range   Glucose-Capillary 191 (H) 70 - 99 mg/dL  Glucose, capillary     Status: Abnormal   Collection Time: 10/08/23  3:48 PM  Result Value Ref Range   Glucose-Capillary 169 (H) 70 - 99 mg/dL  Glucose, capillary     Status: Abnormal   Collection Time: 10/08/23  7:44 PM  Result Value Ref Range   Glucose-Capillary 144 (H) 70 - 99 mg/dL  Glucose, capillary     Status: Abnormal   Collection Time: 10/08/23 11:49 PM  Result Value Ref Range   Glucose-Capillary 186 (H) 70 - 99 mg/dL  Glucose, capillary     Status: Abnormal   Collection Time: 10/09/23  3:45 AM  Result Value Ref Range   Glucose-Capillary 295 (H) 70 - 99 mg/dL  CBC     Status: Abnormal   Collection Time: 10/09/23  5:48 AM  Result Value Ref Range   WBC 7.0 4.0 - 10.5 K/uL   RBC 4.08 (L) 4.22 - 5.81 MIL/uL  Hemoglobin 13.0 13.0 - 17.0 g/dL   HCT 69.6 (L) 29.5 - 28.4 %   MCV 92.4 80.0 - 100.0 fL   MCH 31.9 26.0 - 34.0 pg   MCHC 34.5 30.0 - 36.0 g/dL   RDW 13.2 44.0 - 10.2 %   Platelets 170 150 - 400 K/uL   nRBC 0.0 0.0 - 0.2 %  Basic metabolic panel     Status: Abnormal   Collection Time: 10/09/23  5:48 AM  Result Value Ref Range   Sodium 135 135 - 145 mmol/L   Potassium 3.9 3.5 - 5.1 mmol/L   Chloride 107 98 - 111 mmol/L   CO2 20 (L) 22 - 32 mmol/L   Glucose, Bld 291 (H) 70 - 99 mg/dL   BUN 23 (H) 6 - 20 mg/dL   Creatinine, Ser 7.25 0.61 - 1.24 mg/dL   Calcium 8.9 8.9 - 36.6 mg/dL   GFR, Estimated >44 >03 mL/min   Anion gap 8 5 - 15  Glucose, capillary     Status:  Abnormal   Collection Time: 10/09/23  7:14 AM  Result Value Ref Range   Glucose-Capillary 259 (H) 70 - 99 mg/dL    Assessment & Plan: Present on Admission:  SDH (subdural hematoma) (HCC)    LOS: 2 days   Additional comments:I reviewed the patient's new clinical lab test results. / Fall from a cart   TBI/SDH/SAH - NSGY c/s, Dr. Franky Macho, TBI team therapies Agitation - much improved, dex off this AM so far Left temporal bone fracture, L TMJ FX -Dr. Elijah Birk recs for Ciprodex drops,  ETOH use daily - CIWA DM - SSI HTN - holding home meds FEN - ate most of his breakfast, D/C Cortrak DVT - SCDs, LMWH today Dispo - ICU, I spoke with his wife. His son was on facetime. Consider transfer out of ICU later today if stays off dex. Critical Care Total Time*: 33 Minutes  Charles Gelinas, MD, MPH, FACS Trauma & General Surgery Use [email or web address] to contact on call provider  10/09/2023  *Care during the described time interval was provided by me. I have reviewed this patient's available data, including medical history, events of note, physical examination and test results as part of my evaluation.

## 2023-10-09 NOTE — Progress Notes (Signed)
Physical Therapy Treatment Patient Details Name: Charles Dorsey MRN: [id number] DOB: 1960/12/26 Today's Date: 10/09/2023   History of Present Illness Patient is a 62 yo male presents to [facility] on 11/10 with fall from cart while staining a deck, +ETOH. Pt sustained traumatic R SAH, R SDH,  L temporal bone fx, L TMJ fx. PMH includes DMII, HLD, coronary stent 2021.    PT Comments  Pt awake and alert upon PT arrival, and is pleasant/appropriate throughout. Pt demonstrating improving activity tolerance and balance, though remains mildly unsteady especially when challenged during gait. Pt with difficulty with smooth pursuits and head turns during gait, with reports of room-spining and/or unsteadiness that remains relatively constant throughout session. Pt progressing well.       If plan is discharge home, recommend the following: A little help with walking and/or transfers;A little help with bathing/dressing/bathroom   Can travel by private vehicle        Equipment Recommendations  None recommended by PT    Recommendations for Other Services       Precautions / Restrictions Precautions Precautions: Fall Restrictions Weight Bearing Restrictions: No     Mobility  Bed Mobility Overal bed mobility: Needs Assistance Bed Mobility: Supine to Sit, Sit to Supine     Supine to sit: Supervision Sit to supine: Supervision   General bed mobility comments: cues for use of bedrails and to take his time    Transfers Overall transfer level: Needs assistance Equipment used: 1 person hand held assist Transfers: Sit to/from Stand Sit to Stand: Min assist           General transfer comment: assist to correct lateral LOB, stand from EOB x2    Ambulation/Gait Ambulation/Gait assistance: Min assist Gait Distance (Feet): 400 Feet Assistive device: 1 person hand held assist Gait Pattern/deviations: Step-through pattern, Decreased stride length, Ataxic, Narrow base of support, Wide base of  support Gait velocity: decr     General Gait Details: assist to steady, especially initially and when challenged.   Stairs             Wheelchair Mobility     Tilt Bed    Modified Rankin (Stroke Patients Only)       Balance Overall balance assessment: Needs assistance Sitting-balance support: Single extremity supported Sitting balance-Leahy Scale: Fair     Standing balance support: Single extremity supported, During functional activity Standing balance-Leahy Scale: Fair                 High Level Balance Comments: weaving of gait with horizontal and vertical head turns; increased time to perform fast/slow gait, 180 deg turn and stop            Cognition Arousal: Alert Behavior During Therapy: WFL for tasks assessed/performed Overall Cognitive Status: Within Functional Limits for tasks assessed                 Rancho Levels of Cognitive Functioning Rancho Los Amigos Scales of Cognitive Functioning: Automatic, Appropriate: Minimal Assistance for Daily Living Skills                   Rancho Mirant Scales of Cognitive Functioning: Automatic, Appropriate: Minimal Assistance for Daily Living Skills [VII]    Exercises      General Comments General comments (skin integrity, edema, etc.): over and undershooting with smooth pursuits and difficulty with VOR during hallway mobility, pt reporting dizziness throughout session especailly when moving quickly which he described as room spinning or unsteady  depending on the moment      Pertinent Vitals/Pain Pain Assessment Pain Assessment: 0-10 Pain Score: 5  Pain Location: head Pain Descriptors / Indicators: Headache Pain Intervention(s): Limited activity within patient's tolerance, Monitored during session, Repositioned    Home Living                          Prior Function            PT Goals (current goals can now be found in the care plan section) Acute Rehab PT  Goals Patient Stated Goal: home PT Goal Formulation: With patient Time For Goal Achievement: 10/22/23 Potential to Achieve Goals: Good Progress towards PT goals: Progressing toward goals    Frequency    Min 1X/week      PT Plan      Co-evaluation              AM-PAC PT "6 Clicks" Mobility   Outcome Measure  Help needed turning from your back to your side while in a flat bed without using bedrails?: A Little Help needed moving from lying on your back to sitting on the side of a flat bed without using bedrails?: A Little Help needed moving to and from a bed to a chair (including a wheelchair)?: A Little Help needed standing up from a chair using your arms (e.g., wheelchair or bedside chair)?: A Little Help needed to walk in hospital room?: A Little Help needed climbing 3-5 steps with a railing? : A Lot 6 Click Score: 17    End of Session Equipment Utilized During Treatment: Gait belt Activity Tolerance: Patient tolerated treatment well Patient left: in bed;with call bell/phone within reach;with bed alarm set;with family/visitor present Nurse Communication: Mobility status PT Visit Diagnosis: Other abnormalities of gait and mobility (R26.89);Muscle weakness (generalized) (M62.81)     Time: 1520-1540 PT Time Calculation (min) (ACUTE ONLY): 20 min  Charges:    $Neuromuscular Re-education: 8-22 mins PT General Charges $$ ACUTE PT VISIT: 1 Visit                     Marye Round, PT DPT Acute Rehabilitation Services Secure Chat Preferred  Office [phone]    [name] [name] 10/09/2023, 4:46 PM

## 2023-10-09 NOTE — Progress Notes (Signed)
Patient ID: Charles Dorsey, male   DOB: Oct 05, 1961, 62 y.o.   MRN: [id number] BP (!) 154/83   Pulse 62   Temp 98.2 F (36.8 C) (Axillary)   Resp 16   Ht 5\' 8"  (1.727 m)   Wt 65.3 kg   SpO2 94%   BMI 21.89 kg/m  By report has had a very good day, following commands, moving all extremities Currently asleep.  Ok for transfer whenever available

## 2023-10-09 NOTE — Progress Notes (Signed)
Inpatient Rehab Admissions Coordinator:    I met with pt., daughter, and wife to discuss potential CIR admit. They are interested. Daughter likely to [facility] to take care of Pt. At d/c. I will send case to insurance and pursue for admit.   Megan Salon, MS, CCC-SLP Rehab Admissions Coordinator  [phone] (celll) [phone] (office)

## 2023-10-10 ENCOUNTER — Inpatient Hospital Stay (HOSPITAL_COMMUNITY)
Admission: AD | Admit: 2023-10-10 | Payer: BC Managed Care – PPO | Source: Intra-hospital | Admitting: Physical Medicine and Rehabilitation

## 2023-10-10 LAB — GLUCOSE, CAPILLARY
Glucose-Capillary: 90 mg/dL (ref 70–99)
Glucose-Capillary: 158 mg/dL — ABNORMAL HIGH (ref 70–99)
Glucose-Capillary: 128 mg/dL — ABNORMAL HIGH (ref 70–99)
Glucose-Capillary: 181 mg/dL — ABNORMAL HIGH (ref 70–99)
Glucose-Capillary: 142 mg/dL — ABNORMAL HIGH (ref 70–99)
Glucose-Capillary: 136 mg/dL — ABNORMAL HIGH (ref 70–99)

## 2023-10-10 LAB — BASIC METABOLIC PANEL
Anion gap: 8 (ref 5–15)
BUN: 17 mg/dL (ref 6–20)
CO2: 23 mmol/L (ref 22–32)
Calcium: 8.3 mg/dL — ABNORMAL LOW (ref 8.9–10.3)
Chloride: 105 mmol/L (ref 98–111)
Creatinine, Ser: 0.82 mg/dL (ref 0.61–1.24)
GFR, Estimated: >60 mL/min (ref >60)
Glucose, Bld: 115 mg/dL — ABNORMAL HIGH (ref 70–99)
Potassium: 3.2 mmol/L — ABNORMAL LOW (ref 3.5–5.1)
Sodium: 136 mmol/L (ref 135–145)

## 2023-10-10 LAB — CBC
HCT: 37 % — ABNORMAL LOW (ref 39.0–52.0)
Hemoglobin: 13.3 g/dL (ref 13.0–17.0)
MCH: 32.3 pg (ref 26.0–34.0)
MCHC: 35.9 g/dL (ref 30.0–36.0)
MCV: 89.8 fL (ref 80.0–100.0)
Platelets: 182 10*3/uL (ref 150–400)
RBC: 4.12 MIL/uL — ABNORMAL LOW (ref 4.22–5.81)
RDW: 11.9 % (ref 11.5–15.5)
WBC: 7.3 10*3/uL (ref 4.0–10.5)
nRBC: 0 % (ref 0.0–0.2)

## 2023-10-10 MED ORDER — NITROGLYCERIN 0.4 MG SL SUBL: 0.4000 mg | SUBLINGUAL_TABLET | SUBLINGUAL | Status: DC | PRN

## 2023-10-10 MED ORDER — POTASSIUM CHLORIDE 20 MEQ PO PACK
40.0000 meq | PACK | Freq: Once | ORAL | Status: AC
  Administered 2023-10-10: 40 meq via ORAL
  Filled 2023-10-10: qty 2

## 2023-10-10 MED ORDER — ORAL CARE MOUTH RINSE
15.0000 mL | OROMUCOSAL | Status: DC | PRN
  Administered 2023-10-13: 15 mL via OROMUCOSAL

## 2023-10-10 MED ORDER — LOSARTAN POTASSIUM 50 MG PO TABS
25.0000 mg | ORAL_TABLET | Freq: Every day | ORAL | Status: DC
  Administered 2023-10-10 – 2023-10-19 (×10): 25 mg via ORAL
  Filled 2023-10-10 (×11): qty 1

## 2023-10-10 MED ORDER — METOPROLOL SUCCINATE ER 50 MG PO TB24
50.0000 mg | ORAL_TABLET | Freq: Every day | ORAL | Status: DC
  Administered 2023-10-10 – 2023-10-19 (×10): 50 mg via ORAL
  Filled 2023-10-10 (×11): qty 1

## 2023-10-10 NOTE — Progress Notes (Signed)
Patient ID: Charles Dorsey, male   DOB: December 26, 1960, 62 y.o.   MRN: [id number] BP (!) 167/92 (BP Location: Right Arm)   Pulse 64   Temp 98.8 F (37.1 C) (Oral)   Resp 16   Ht 5\' 8"  (1.727 m)   Wt 64.2 kg   SpO2 100%   BMI 21.52 kg/m  Alert and oriented Was walking the floor of the unit prior to transfer Obviously improved No new recommendations

## 2023-10-10 NOTE — Progress Notes (Signed)
Inpatient Rehab Admissions Coordinator:    Cancel CIR admit. PT and OT now recommend No follow up. CIR will sign off.   Megan Salon, MS, CCC-SLP Rehab Admissions Coordinator  [phone] (celll) [phone] (office)

## 2023-10-10 NOTE — TOC Initial Note (Signed)
Transition of Care [facility]) - Initial/Assessment Note    Patient Details  Name: Charles Dorsey MRN: [id number] Date of Birth: 07-31-1961  [facility] CM/SW Contact:    Glennon Mac, RN Phone Number: 10/10/2023, 2:04 PM  Clinical Narrative:                 Patient is a 62 yo male presents to [facility] on 11/10 with fall from cart while staining a deck, +ETOH. Pt sustained traumatic R SAH, R SDH, L temporal bone fx, L TMJ fx.  PTA, pt independent and living at home with wife and daughter, who can provide needed assistance at dc.  PT/OT originally recommending CIR, but now are recommending no follow up or DME. Will continue to follow as patient progresses; will follow up in AM for FMLA paperwork questions.    Expected Discharge Plan: Home/Self Care Barriers to Discharge: Continued Medical Work up            Expected Discharge Plan and Services   Discharge Planning Services: CM Consult   Living arrangements for the past 2 months: Single Family Home Expected Discharge Date: 10/10/23                                    Prior Living Arrangements/Services Living arrangements for the past 2 months: Single Family Home Lives with:: Spouse, Adult Children Patient language and need for interpreter reviewed:: Yes Do you feel safe going back to the place where you live?: Yes      Need for Family Participation in Patient Care: Yes (Comment) Care giver support system in place?: Yes (comment)   Criminal Activity/Legal Involvement Pertinent to Current Situation/Hospitalization: No - Comment as needed               Emotional Assessment Appearance:: Appears stated age Attitude/Demeanor/Rapport: Engaged Affect (typically observed): Accepting Orientation: : Oriented to Self, Oriented to Place, Oriented to  Time, Oriented to Situation      Admission diagnosis:  SDH (subdural hematoma) (HCC) [S06.5XAA] Patient Active Problem List   Diagnosis Date Noted   Malnutrition of  moderate degree 10/09/2023   SDH (subdural hematoma) (HCC) 10/07/2023   Closed fracture of temporal bone (HCC) 10/07/2023   Coronary artery disease of native artery of native heart with stable angina pectoris (HCC) 12/25/2022   Type 1 diabetes mellitus without complication (HCC) 09/11/2020   Essential hypertension 09/11/2020   Unstable angina (HCC) 03/18/2020   Family history of early CAD 03/18/2020   Hyperlipidemia 03/18/2020   Post PTCA 03/18/2020   Exertional chest pain 03/10/2020   PCP:  [facility]:   [id number] [place], [place] - [address] AT [facility] & [address] [place] [address] Phone: [phone] Fax: [phone]  [facility] [address] [place] [address] Phone: [phone] Fax: [phone]     Social Determinants of Health (SDOH) Social History: SDOH Screenings   Tobacco Use: High Risk (10/07/2023)   SDOH Interventions:     Readmission Risk Interventions     No data to display         Quintella Baton, RN, BSN  Trauma/Neuro ICU Case Manager [phone]

## 2023-10-10 NOTE — Progress Notes (Signed)
Pt with c/o of dizziness. When asked to rate on a 10-pt scale, pt reported dizziness at 8/10. Trauma on-call Provider paged. Return call received from Dr. Janee Morn. No new orders given. MD reported he would reorder pt's home meds.

## 2023-10-10 NOTE — Progress Notes (Signed)
Occupational Therapy Treatment Patient Details Name: Charles Dorsey MRN: [id number] DOB: 13-Apr-1961 Today's Date: 10/10/2023   History of present illness Patient is a 62 yo male presents to [facility] on 11/10 with fall from cart while staining a deck, +ETOH. Pt sustained traumatic R SAH, R SDH,  L temporal bone fx, L TMJ fx. PMH includes DMII, HLD, coronary stent 2021.   OT comments  Pt currently at min guard assist for simulated toilet transfers without an assistive device and functional mobility.  Min assist for simulated tub shower transfers without UE support when stepping in and out.  Still with slight dizziness rated at 2/10 at rest and only increasing to a 3/10 with all transitional movements.  Feel he will continue to benefit from acute care OT at this time but will likely not need post acute inpatient rehab based on rapid progress and having consistent initial assist at home between spouse and other family.         If plan is discharge home, recommend the following:  Direct supervision/assist for medications management;Direct supervision/assist for financial management;Help with stairs or ramp for entrance;Assist for transportation;Assistance with cooking/housework;A little help with walking and/or transfers;A little help with bathing/dressing/bathroom   Equipment Recommendations  None recommended by OT (shower seat to be obtained outside of the hospital)       Precautions / Restrictions Precautions Precautions: Fall Restrictions Weight Bearing Restrictions: No       Mobility Bed Mobility Overal bed mobility: Modified Independent       Supine to sit: Modified independent (Device/Increase time)     General bed mobility comments: NO difficulty with supine to sit.  He reports dizziness 3/10 with transition    Transfers Overall transfer level: Needs assistance Equipment used: None Transfers: Sit to/from Stand Sit to Stand: Contact guard assist     Step pivot transfers: Contact  guard assist     General transfer comment: MIn guard for standing and mobility with head turns.  Pt with only LOB when stepping over simulated shower edge without UE support.  Able to squat and pick up multiple items from the floor without LOB.     Balance Overall balance assessment: Needs assistance Sitting-balance support: Single extremity supported Sitting balance-Leahy Scale: Good     Standing balance support: No upper extremity supported Standing balance-Leahy Scale: Good                             ADL either performed or assessed with clinical judgement   ADL Overall ADL's : Needs assistance/impaired Eating/Feeding: Independent;Sitting   Grooming: Supervision/safety;Standing Grooming Details (indicate cue type and reason): simulated                 Toilet Transfer: Contact guard assist;Ambulation Toilet Transfer Details (indicate cue type and reason): without assistive device     Tub/ Shower Transfer: Tub transfer;Minimal assistance;Ambulation Tub/Shower Transfer Details (indicate cue type and reason): stepping over simulated shower edge Functional mobility during ADLs: Contact guard assist (ambulation without an assistive device) General ADL Comments: BP 158/75 in sitting.  Much improved dynamic standing balance compared to last session with this OT.  Discussed need for a shower seat at home for initial use and family will look to borrow/purchase from outside source as needed.    Extremity/Trunk Assessment              Vision Baseline Vision/History: 1 Wears glasses (near vision) Patient Visual Report: Other (comment) (  slight dizziness) Vision Assessment?: Yes Eye Alignment: Within Functional Limits Ocular Range of Motion: Within Functional Limits Alignment/Gaze Preference: Within Defined Limits Tracking/Visual Pursuits: Able to track stimulus in all quads without difficulty Saccades: Within functional limits Convergence: Within functional  limits Visual Fields: No apparent deficits Additional Comments: Slight catch up saccade with finding object to the left but this only occurred with 2 out of 4 trials.  Pt with head movements during ambulation and in sitting in all directions.  He reports dizziness at a 2/10 at rest only increasing to a 3/10 with all movement.   Perception Perception Perception: Within Functional Limits   Praxis Praxis Praxis: WFL    Cognition Arousal: Alert Behavior During Therapy: WFL for tasks assessed/performed Overall Cognitive Status: Within Functional Limits for tasks assessed                                 General Comments: Pt able to recall room number and 3 word recall.  Demonstrated awareness of balance deficits and slight dizziness.              General Comments      Pertinent Vitals/ Pain       Pain Assessment Pain Assessment: Faces Faces Pain Scale: Hurts a little bit Pain Location: left side of head Pain Descriptors / Indicators: Headache Pain Intervention(s): Limited activity within patient's tolerance  Home Living                                              Frequency  Min 1X/week        Progress Toward Goals  OT Goals(current goals can now be found in the care plan section)  Progress towards OT goals: Progressing toward goals  Acute Rehab OT Goals OT Goal Formulation: With patient Time For Goal Achievement: 10/22/23 Potential to Achieve Goals: Good  Plan         AM-PAC OT "6 Clicks" Daily Activity     Outcome Measure   Help from another person eating meals?: None Help from another person taking care of personal grooming?: A Little Help from another person toileting, which includes using toliet, bedpan, or urinal?: A Little Help from another person bathing (including washing, rinsing, drying)?: A Little Help from another person to put on and taking off regular upper body clothing?: A Little Help from another person to put  on and taking off regular lower body clothing?: A Little 6 Click Score: 19    End of Session Equipment Utilized During Treatment: Gait belt  OT Visit Diagnosis: Unsteadiness on feet (R26.81);Other abnormalities of gait and mobility (R26.89);Muscle weakness (generalized) (M62.81);Pain Pain - Right/Left:  (headache)   Activity Tolerance Patient tolerated treatment well   Patient Left in chair;with call bell/phone within reach;with chair alarm set   Nurse Communication Mobility status        Time: 2536-6440 OT Time Calculation (min): 31 min  Charges: OT General Charges $OT Visit: 1 Visit OT Treatments $Self Care/Home Management : 8-22 mins $Neuromuscular Re-education: 8-22 mins  Perrin Maltese, OTR/L Acute Rehabilitation Services  Office [phone] 10/10/2023
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
## 2023-10-10 NOTE — Progress Notes (Addendum)
Pt stayed off dex all day (turned off at 0545).  D/C'd restraints this am.  Pt has remained calm throughout day and had a good day.  Pt has been eating and drinking well today.  Cortrak also removed this morning.  Pt walked to bathroom multiple times today with 1 assist.

## 2023-10-10 NOTE — Progress Notes (Signed)
Inpatient Rehab Admissions Coordinator:    I have a CIR bed for this Pt. Today. RN may call report to [phone].   Pt. To admit to CIR for an estimated 5-7 days of intensive rehab with the goal of reaching mod I to supervision level and returning home with assist from family.   Megan Salon, MS, CCC-SLP Rehab Admissions Coordinator  [phone] (celll) [phone] (office)

## 2023-10-10 NOTE — Progress Notes (Addendum)
Patient ID: Charles Dorsey, male   DOB: Sep 29, 1961, 62 y.o.   MRN: [id number] Follow up - Trauma Critical Care   Patient Details:    Charles Dorsey is an 62 y.o. male.  Lines/tubes :   Microbiology/Sepsis markers: Results for orders placed or performed during the hospital encounter of 10/07/23  MRSA Next Gen by PCR, Nasal     Status: None   Collection Time: 10/07/23  5:28 PM   Specimen: Nasal Mucosa; Nasal Swab  Result Value Ref Range Status   MRSA by PCR Next Gen NOT DETECTED NOT DETECTED Final    Comment: (NOTE) The GeneXpert MRSA Assay (FDA approved for NASAL specimens only), is one component of a comprehensive MRSA colonization surveillance program. It is not intended to diagnose MRSA infection nor to guide or monitor treatment for MRSA infections. Test performance is not FDA approved in patients less than 36 years old. Performed at [facility], [address] [place], [place] [address]     Anti-infectives:  Anti-infectives (From admission, onward)    None      Consults: Treatment Team:  Coletta Memos, MD    Studies:    Events:  Subjective:    Overnight Issues: stable, off dex  Objective:  Vital signs for last 24 hours: Temp:  [97.6 F (36.4 C)-98.2 F (36.8 C)] 97.6 F (36.4 C) (11/13 0800) Pulse Rate:  [57-90] 58 (11/13 0700) Resp:  [10-18] 12 (11/13 0700) BP: (121-170)/(60-130) 150/85 (11/13 0700) SpO2:  [94 %-100 %] 100 % (11/13 0700) Weight:  [64.2 kg] 64.2 kg (11/13 0500)  Hemodynamic parameters for last 24 hours:    Intake/Output from previous day: 11/12 0701 - 11/13 0700 In: 923 [P.O.:840; NG/GT:83] Out: 0   Intake/Output this shift: No intake/output data recorded.  Vent settings for last 24 hours:    Physical Exam:  General: alert and no respiratory distress Neuro: alert, F/C, MAE HEENT/Neck: no bleeding from L ear Resp: clear to auscultation bilaterally CVS: RRR GI: soft, NT Extremities: no edema  Results for orders  placed or performed during the hospital encounter of 10/07/23 (from the past 24 hour(s))  Glucose, capillary     Status: Abnormal   Collection Time: 10/09/23 11:51 AM  Result Value Ref Range   Glucose-Capillary 250 (H) 70 - 99 mg/dL  Glucose, capillary     Status: Abnormal   Collection Time: 10/09/23  3:46 PM  Result Value Ref Range   Glucose-Capillary 146 (H) 70 - 99 mg/dL  Glucose, capillary     Status: Abnormal   Collection Time: 10/09/23  7:45 PM  Result Value Ref Range   Glucose-Capillary 227 (H) 70 - 99 mg/dL  Glucose, capillary     Status: Abnormal   Collection Time: 10/09/23 11:32 PM  Result Value Ref Range   Glucose-Capillary 158 (H) 70 - 99 mg/dL  Glucose, capillary     Status: None   Collection Time: 10/10/23  3:38 AM  Result Value Ref Range   Glucose-Capillary 90 70 - 99 mg/dL  CBC     Status: Abnormal   Collection Time: 10/10/23  5:42 AM  Result Value Ref Range   WBC 7.3 4.0 - 10.5 K/uL   RBC 4.12 (L) 4.22 - 5.81 MIL/uL   Hemoglobin 13.3 13.0 - 17.0 g/dL   HCT 28.4 (L) 13.2 - 44.0 %   MCV 89.8 80.0 - 100.0 fL   MCH 32.3 26.0 - 34.0 pg   MCHC 35.9 30.0 - 36.0 g/dL  RDW 11.9 11.5 - 15.5 %   Platelets 182 150 - 400 K/uL   nRBC 0.0 0.0 - 0.2 %  Basic metabolic panel     Status: Abnormal   Collection Time: 10/10/23  5:42 AM  Result Value Ref Range   Sodium 136 135 - 145 mmol/L   Potassium 3.2 (L) 3.5 - 5.1 mmol/L   Chloride 105 98 - 111 mmol/L   CO2 23 22 - 32 mmol/L   Glucose, Bld 115 (H) 70 - 99 mg/dL   BUN 17 6 - 20 mg/dL   Creatinine, Ser 8.29 0.61 - 1.24 mg/dL   Calcium 8.3 (L) 8.9 - 10.3 mg/dL   GFR, Estimated >56 >21 mL/min   Anion gap 8 5 - 15  Glucose, capillary     Status: Abnormal   Collection Time: 10/10/23  7:41 AM  Result Value Ref Range   Glucose-Capillary 158 (H) 70 - 99 mg/dL    Assessment & Plan: Present on Admission:  SDH (subdural hematoma) (HCC)    LOS: 3 days   Additional comments:I reviewed the patient's new clinical lab test  results. / Fall from a cart   TBI/SDH/SAH - NSGY c/s, Dr. Franky Macho, TBI team therapies Agitation - resolved Left temporal bone fracture, L TMJ FX -Dr. Elijah Birk recs for Ciprodex drops,  ETOH use daily - CIWA DM - SSI HTN - holding home meds FEN - diet, replete hypokalemia DVT - SCDs, LMWH Dispo - to med surg, plan CIR, , I spoke with his wife.  Critical Care Total Time*: 32 Minutes  Charles Gelinas, MD, MPH, FACS Trauma & General Surgery Use [email or web address] to contact on call provider  10/10/2023  *Care during the described time interval was provided by me. I have reviewed this patient's available data, including medical history, events of note, physical examination and test results as part of my evaluation.

## 2023-10-10 NOTE — Progress Notes (Signed)
Physical Therapy Treatment Patient Details Name: Charles Dorsey MRN: [id number] DOB: 03-05-1961 Today's Date: 10/10/2023   History of Present Illness Patient is a 62 yo male presents to [facility] on 11/10 with fall from cart while staining a deck, +ETOH. Pt sustained traumatic R SAH, R SDH,  L temporal bone fx, L TMJ fx. PMH includes DMII, HLD, coronary stent 2021.    PT Comments  Patient making excellent progress today able to ambulate with head turns and completed DGI without issues scored 22/24.  Still with some vertical VOR issues with target maintenance, but no c/o dizziness.  Wife in the room and feels cognition improved and pt able to multitask for DGI as well as make preferences known he wishes to play games on his phone rather than watch TV.  PT will continue in the acute setting.  No PT follow up needed at d/c.     If plan is discharge home, recommend the following: Supervision due to cognitive status;Assist for transportation   Can travel by private vehicle        Equipment Recommendations  None recommended by PT    Recommendations for Other Services       Precautions / Restrictions Precautions Precautions: None Restrictions Weight Bearing Restrictions: No     Mobility  Bed Mobility               General bed mobility comments: up in chair    Transfers   Equipment used: None Transfers: Sit to/from Stand Sit to Stand: Supervision                Ambulation/Gait Ambulation/Gait assistance: Supervision Gait Distance (Feet): 400 Feet Assistive device: None Gait Pattern/deviations: WFL(Within Functional Limits)       General Gait Details: completed DGI, no LOB, maneuvering around and over obstacles without issues   Stairs Stairs: Yes Stairs assistance: Supervision Stair Management: One rail Left, Alternating pattern, Forwards Number of Stairs: 5     Wheelchair Mobility     Tilt Bed    Modified Rankin (Stroke Patients Only)       Balance  Overall balance assessment: Modified Independent   Sitting balance-Leahy Scale: Normal       Standing balance-Leahy Scale: Good                   Standardized Balance Assessment Standardized Balance Assessment : Dynamic Gait Index   Dynamic Gait Index Level Surface: Normal Change in Gait Speed: Mild Impairment Gait with Horizontal Head Turns: Normal Gait with Vertical Head Turns: Normal Gait and Pivot Turn: Normal Step Over Obstacle: Normal Step Around Obstacles: Normal Steps: Mild Impairment Total Score: 22      Cognition Arousal: Alert Behavior During Therapy: WFL for tasks assessed/performed Overall Cognitive Status: Within Functional Limits for tasks assessed                 Rancho Levels of Cognitive Functioning Rancho Los Amigos Scales of Cognitive Functioning: Purposeful, Appropriate: Stand-by Assistance on Request                   Rancho Mirant Scales of Cognitive Functioning: Purposeful, Appropriate: Stand-by Assistance on Request [IX]    Exercises      General Comments General comments (skin integrity, edema, etc.): performed smooth pursuits noting some saccadic eye movements, saccades WNL and VOR horizontal WNL, vertical with some issues with target maintenance, VOR cancellation WNL      Pertinent Vitals/Pain Pain Assessment Pain Assessment: No/denies pain  Home Living                          Prior Function            PT Goals (current goals can now be found in the care plan section) Progress towards PT goals: Progressing toward goals    Frequency    Min 1X/week      PT Plan      Co-evaluation              AM-PAC PT "6 Clicks" Mobility   Outcome Measure  Help needed turning from your back to your side while in a flat bed without using bedrails?: A Little Help needed moving from lying on your back to sitting on the side of a flat bed without using bedrails?: A Little Help needed moving to  and from a bed to a chair (including a wheelchair)?: None Help needed standing up from a chair using your arms (e.g., wheelchair or bedside chair)?: None Help needed to walk in hospital room?: None Help needed climbing 3-5 steps with a railing? : A Little 6 Click Score: 21    End of Session Equipment Utilized During Treatment: Gait belt Activity Tolerance: Patient tolerated treatment well Patient left: in chair;with family/visitor present;with call bell/phone within reach   PT Visit Diagnosis: Other abnormalities of gait and mobility (R26.89)     Time: 2440-1027 PT Time Calculation (min) (ACUTE ONLY): 17 min  Charges:    $Gait Training: 8-22 mins PT General Charges $$ ACUTE PT VISIT: 1 Visit                     Sheran Lawless, PT Acute Rehabilitation Services Office:[phone] 10/10/2023    Elray Mcgregor 10/10/2023, 11:43 AM

## 2023-10-11 ENCOUNTER — Other Ambulatory Visit: Payer: Self-pay

## 2023-10-11 LAB — BASIC METABOLIC PANEL
Anion gap: 7 (ref 5–15)
BUN: 16 mg/dL (ref 6–20)
CO2: 25 mmol/L (ref 22–32)
Calcium: 8.9 mg/dL (ref 8.9–10.3)
Chloride: 93 mmol/L — ABNORMAL LOW (ref 98–111)
Creatinine, Ser: 0.91 mg/dL (ref 0.61–1.24)
GFR, Estimated: >60 mL/min (ref >60)
Glucose, Bld: 137 mg/dL — ABNORMAL HIGH (ref 70–99)
Potassium: 3.6 mmol/L (ref 3.5–5.1)
Sodium: 125 mmol/L — ABNORMAL LOW (ref 135–145)

## 2023-10-11 LAB — CBC
HCT: 41.4 % (ref 39.0–52.0)
Hemoglobin: 15.1 g/dL (ref 13.0–17.0)
MCH: 31.7 pg (ref 26.0–34.0)
MCHC: 36.5 g/dL — ABNORMAL HIGH (ref 30.0–36.0)
MCV: 87 fL (ref 80.0–100.0)
Platelets: 221 10*3/uL (ref 150–400)
RBC: 4.76 MIL/uL (ref 4.22–5.81)
RDW: 11.4 % — ABNORMAL LOW (ref 11.5–15.5)
WBC: 11.3 10*3/uL — ABNORMAL HIGH (ref 4.0–10.5)
nRBC: 0 % (ref 0.0–0.2)

## 2023-10-11 LAB — GLUCOSE, CAPILLARY
Glucose-Capillary: 153 mg/dL — ABNORMAL HIGH (ref 70–99)
Glucose-Capillary: 141 mg/dL — ABNORMAL HIGH (ref 70–99)
Glucose-Capillary: 127 mg/dL — ABNORMAL HIGH (ref 70–99)
Glucose-Capillary: 149 mg/dL — ABNORMAL HIGH (ref 70–99)
Glucose-Capillary: 155 mg/dL — ABNORMAL HIGH (ref 70–99)
Glucose-Capillary: 169 mg/dL — ABNORMAL HIGH (ref 70–99)

## 2023-10-11 MED ORDER — METHOCARBAMOL 1000 MG/10ML IJ SOLN
500.0000 mg | Freq: Three times a day (TID) | INTRAMUSCULAR | Status: AC
  Administered 2023-10-13 – 2023-10-14 (×2): 500 mg via INTRAVENOUS
  Filled 2023-10-11 (×4): qty 10

## 2023-10-11 MED ORDER — LORAZEPAM 1 MG PO TABS
1.0000 mg | ORAL_TABLET | ORAL | Status: AC | PRN
  Administered 2023-10-11: 1 mg via ORAL
  Administered 2023-10-12: 2 mg via ORAL
  Filled 2023-10-11: qty 2
  Filled 2023-10-11: qty 1

## 2023-10-11 MED ORDER — SODIUM CHLORIDE 1 G PO TABS
1.0000 g | ORAL_TABLET | Freq: Three times a day (TID) | ORAL | Status: DC
  Administered 2023-10-11 – 2023-10-12 (×2): 1 g via ORAL
  Filled 2023-10-11 (×2): qty 1

## 2023-10-11 MED ORDER — LORAZEPAM 1 MG PO TABS
1.0000 mg | ORAL_TABLET | ORAL | Status: AC | PRN
Start: 2023-10-11 — End: 2023-10-14

## 2023-10-11 MED ORDER — ADULT MULTIVITAMIN W/MINERALS CH
1.0000 | ORAL_TABLET | Freq: Every day | ORAL | Status: DC
  Administered 2023-10-11 – 2023-10-19 (×8): 1 via ORAL
  Filled 2023-10-11 (×9): qty 1

## 2023-10-11 MED ORDER — FOLIC ACID 1 MG PO TABS
1.0000 mg | ORAL_TABLET | Freq: Every day | ORAL | Status: DC
Start: 2023-10-11 — End: 2023-10-19
  Administered 2023-10-11 – 2023-10-19 (×8): 1 mg via ORAL
  Filled 2023-10-11 (×9): qty 1

## 2023-10-11 MED ORDER — MORPHINE SULFATE (PF) 2 MG/ML IV SOLN
2.0000 mg | INTRAVENOUS | Status: DC | PRN
  Administered 2023-10-12 – 2023-10-16 (×2): 2 mg via INTRAVENOUS
  Filled 2023-10-11 (×2): qty 1

## 2023-10-11 MED ORDER — METHOCARBAMOL 500 MG PO TABS
500.0000 mg | ORAL_TABLET | Freq: Three times a day (TID) | ORAL | Status: AC
  Administered 2023-10-11 – 2023-10-12 (×5): 500 mg via ORAL
  Filled 2023-10-11 (×7): qty 1

## 2023-10-11 MED ORDER — SODIUM CHLORIDE 0.9 % IV SOLN: INTRAVENOUS | Status: AC

## 2023-10-11 NOTE — Progress Notes (Signed)
Patient ID: Charles Dorsey, male   DOB: [birth year], 62 y.o.   MRN: [id number] BP (!) 162/71 (BP Location: Left Arm)   Pulse (!) 56   Temp 97.6 F (36.4 C) (Oral)   Resp 18   Ht 5\' 8"  (1.727 m)   Wt 64 kg   SpO2 98%   BMI 21.45 kg/m  Ok for discharge when trauma deems acceptable.

## 2023-10-11 NOTE — Plan of Care (Signed)
  Problem: Safety: Goal: Non-violent Restraint(s) Outcome: Progressing   Problem: Education: Goal: Ability to describe self-care measures that may prevent or decrease complications (Diabetes Survival Skills Education) will improve Outcome: Progressing Goal: Individualized Educational Video(s) Outcome: Progressing   Problem: Coping: Goal: Ability to adjust to condition or change in health will improve Outcome: Progressing   Problem: Fluid Volume: Goal: Ability to maintain a balanced intake and output will improve Outcome: Progressing   Problem: Health Behavior/Discharge Planning: Goal: Ability to identify and utilize available resources and services will improve Outcome: Progressing Goal: Ability to manage health-related needs will improve Outcome: Progressing   Problem: Metabolic: Goal: Ability to maintain appropriate glucose levels will improve Outcome: Progressing   Problem: Nutritional: Goal: Maintenance of adequate nutrition will improve Outcome: Progressing Goal: Progress toward achieving an optimal weight will improve Outcome: Progressing   Problem: Skin Integrity: Goal: Risk for impaired skin integrity will decrease Outcome: Progressing   Problem: Tissue Perfusion: Goal: Adequacy of tissue perfusion will improve Outcome: Progressing   Problem: Education: Goal: Knowledge of General Education information will improve Description: Including pain rating scale, medication(s)/side effects and non-pharmacologic comfort measures Outcome: Progressing   Problem: Health Behavior/Discharge Planning: Goal: Ability to manage health-related needs will improve Outcome: Progressing   Problem: Clinical Measurements: Goal: Ability to maintain clinical measurements within normal limits will improve Outcome: Progressing Goal: Will remain free from infection Outcome: Progressing Goal: Diagnostic test results will improve Outcome: Progressing Goal: Respiratory complications  will improve Outcome: Progressing Goal: Cardiovascular complication will be avoided Outcome: Progressing   Problem: Activity: Goal: Risk for activity intolerance will decrease Outcome: Progressing   Problem: Nutrition: Goal: Adequate nutrition will be maintained Outcome: Progressing   Problem: Coping: Goal: Level of anxiety will decrease Outcome: Progressing   Problem: Elimination: Goal: Will not experience complications related to bowel motility Outcome: Progressing Goal: Will not experience complications related to urinary retention Outcome: Progressing   Problem: Pain Management: Goal: General experience of comfort will improve Outcome: Progressing   Problem: Safety: Goal: Ability to remain free from injury will improve Outcome: Progressing   Problem: Skin Integrity: Goal: Risk for impaired skin integrity will decrease Outcome: Progressing

## 2023-10-11 NOTE — Progress Notes (Signed)
PT Cancellation Note  Patient Details Name: Charles Dorsey MRN: [id number] DOB: November 07, 1961   Cancelled Treatment:    Reason Eval/Treat Not Completed: (P) Fatigue/lethargy limiting ability to participate (Family family in room defer due to pt lethargy/fatigue and she is trying to encourage him to eat. Family agreeable to PTA assisting with repositioning him in bed chair posture and to turn lights on to promote more alertness.) Will continue efforts next date per PT plan of care as schedule permits.   Daquisha Clermont M Nahum Sherrer 10/11/2023, 6:04 PM

## 2023-10-11 NOTE — Progress Notes (Signed)
Progress Note     Subjective: Had emesis overnight and per wife was large volume. He is tired this am and has a headache and worsened dizziness. He states dizziness is now 8/10 when previously 3/10. Symptoms worsened overnight without movement and was not eating/drinking prior to emesis. Has dried small amount of food this am without further emesis but low appetite and still nauseas. No abdominal pain. Last BM was date of admission. He denies new vision changes.    Objective: Vital signs in last 24 hours: Temp:  [97.8 F (36.6 C)-98.8 F (37.1 C)] 98.2 F (36.8 C) (11/14 0800) Pulse Rate:  [62-74] 64 (11/14 0800) Resp:  [16-19] 17 (11/14 0800) BP: (157-181)/(74-100) 170/74 (11/14 0800) SpO2:  [97 %-100 %] 98 % (11/14 0800) Weight:  [64 kg] 64 kg (11/14 0702) Last BM Date : 10/07/23  Intake/Output from previous day: 11/13 0701 - 11/14 0700 In: -  Out: 1 [Emesis/NG output:1] Intake/Output this shift: No intake/output data recorded.  PE: General: pleasant, WD, male who is laying in bed in NAD HEENT: head is normocephalic, atraumatic.  Sclera are noninjected.  Pupils equal, round, and reactive to light. EOMs intact.  Ears and nose without any masses or lesions.  Mouth is pink and moist No bleeding from L ear - old dried blood present Lungs:  Respiratory effort nonlabored on room air Abd: soft, NT, ND MSK: all 4 extremities are symmetrical with no cyanosis, clubbing, or edema. Skin: warm and dry Psych: A&Ox3 with an appropriate affect.    Lab Results:  Recent Labs    10/09/23 0548 10/10/23 0542  WBC 7.0 7.3  HGB 13.0 13.3  HCT 37.7* 37.0*  PLT 170 182   BMET Recent Labs    10/09/23 0548 10/10/23 0542  NA 135 136  K 3.9 3.2*  CL 107 105  CO2 20* 23  GLUCOSE 291* 115*  BUN 23* 17  CREATININE 0.84 0.82  CALCIUM 8.9 8.3*   PT/INR No results for input(s): "LABPROT", "INR" in the last 72 hours. CMP     Component Value Date/Time   NA 136 10/10/2023 0542   K  3.2 (L) 10/10/2023 0542   CL 105 10/10/2023 0542   CO2 23 10/10/2023 0542   GLUCOSE 115 (H) 10/10/2023 0542   BUN 17 10/10/2023 0542   CREATININE 0.82 10/10/2023 0542   CALCIUM 8.3 (L) 10/10/2023 0542   PROT 6.8 10/07/2023 1315   ALBUMIN 3.8 10/07/2023 1315   AST 78 (H) 10/07/2023 1315   ALT 56 (H) 10/07/2023 1315   ALKPHOS 134 (H) 10/07/2023 1315   BILITOT 1.0 10/07/2023 1315   GFRNONAA >60 10/10/2023 0542   GFRAA >60 03/19/2020 0355   Lipase     Component Value Date/Time   LIPASE 153 (H) 12/21/2020 8119       Studies/Results: No results found.  Anti-infectives: Anti-infectives (From admission, onward)    None        Assessment/Plan Fall from a cart   TBI/SDH/SAH - NSGY c/s, Dr. Franky Macho, TBI team therapies Agitation - resolved Left temporal bone fracture, L TMJ FX -Dr. Elijah Birk recs for Ciprodex drops,  ETOH use daily - CIWA DM - SSI HTN - holding home meds FEN - diet, salt tabs and NS IVF for hyponatremia DVT - SCDs, LMWH Dispo - to med surg, I spoke with his wife. No longer needing CIR. Plan for home once medically stable. Worsened headache/dizziness today and will replete sodium. Continue therapies  I reviewed last 24 h  vitals and pain scores, last 48 h intake and output, last 24 h labs and trends, and last 24 h imaging results.   LOS: 4 days   Eric Form, [facility] 10/11/2023, 9:12 AM Please see Amion for pager number during day hours 7:00am-4:30pm

## 2023-10-12 ENCOUNTER — Inpatient Hospital Stay (HOSPITAL_COMMUNITY): Payer: BC Managed Care – PPO

## 2023-10-12 LAB — CBC
HCT: 39.8 % (ref 39.0–52.0)
Hemoglobin: 14.9 g/dL (ref 13.0–17.0)
MCH: 31.8 pg (ref 26.0–34.0)
MCHC: 37.4 g/dL — ABNORMAL HIGH (ref 30.0–36.0)
MCV: 84.9 fL (ref 80.0–100.0)
Platelets: 233 10*3/uL (ref 150–400)
RBC: 4.69 MIL/uL (ref 4.22–5.81)
RDW: 11.4 % — ABNORMAL LOW (ref 11.5–15.5)
WBC: 12.2 10*3/uL — ABNORMAL HIGH (ref 4.0–10.5)
nRBC: 0 % (ref 0.0–0.2)

## 2023-10-12 LAB — GLUCOSE, CAPILLARY
Glucose-Capillary: 152 mg/dL — ABNORMAL HIGH (ref 70–99)
Glucose-Capillary: 164 mg/dL — ABNORMAL HIGH (ref 70–99)
Glucose-Capillary: 167 mg/dL — ABNORMAL HIGH (ref 70–99)
Glucose-Capillary: 176 mg/dL — ABNORMAL HIGH (ref 70–99)
Glucose-Capillary: 181 mg/dL — ABNORMAL HIGH (ref 70–99)
Glucose-Capillary: 185 mg/dL — ABNORMAL HIGH (ref 70–99)
Glucose-Capillary: 195 mg/dL — ABNORMAL HIGH (ref 70–99)

## 2023-10-12 LAB — BASIC METABOLIC PANEL
Anion gap: 12 (ref 5–15)
BUN: 13 mg/dL (ref 6–20)
CO2: 21 mmol/L — ABNORMAL LOW (ref 22–32)
Calcium: 9 mg/dL (ref 8.9–10.3)
Chloride: 93 mmol/L — ABNORMAL LOW (ref 98–111)
Creatinine, Ser: 0.5 mg/dL — ABNORMAL LOW (ref 0.61–1.24)
GFR, Estimated: >60 mL/min (ref >60)
Glucose, Bld: 161 mg/dL — ABNORMAL HIGH (ref 70–99)
Potassium: 4 mmol/L (ref 3.5–5.1)
Sodium: 126 mmol/L — ABNORMAL LOW (ref 135–145)

## 2023-10-12 MED ORDER — ENSURE ENLIVE PO LIQD
237.0000 mL | Freq: Two times a day (BID) | ORAL | Status: DC
  Administered 2023-10-14 – 2023-10-18 (×9): 237 mL via ORAL

## 2023-10-12 MED ORDER — SODIUM CHLORIDE 1 G PO TABS
2.0000 g | ORAL_TABLET | Freq: Three times a day (TID) | ORAL | Status: AC
  Administered 2023-10-12 (×2): 2 g via ORAL
  Filled 2023-10-12 (×3): qty 2

## 2023-10-12 NOTE — Progress Notes (Signed)
PT Cancellation Note  Patient Details Name: Charles Dorsey MRN: [id number] DOB: Nov 20, 1961   Cancelled Treatment:    Reason Eval/Treat Not Completed: Medical issues which prohibited therapy. Pt with concerning neuro exam this am requiring stat CT. Acute PT will continue to follow as pt is medically appropriate.  Hilton Cork, PT, DPT Secure Chat Preferred  Rehab Office [phone]    Arturo Morton Brion Aliment 10/12/2023, 11:33 AM

## 2023-10-12 NOTE — Progress Notes (Signed)
Occupational Therapy Treatment Patient Details Name: Charles Dorsey MRN: [id number] DOB: [birth year] Today's Date: 10/12/2023   History of present illness Patient is a 62 yo male presents to [facility] on 11/10 with fall from cart while staining a deck, +ETOH. Pt sustained traumatic R SAH, R SDH,  L temporal bone fx, L TMJ fx. PMH includes DMII, HLD, coronary stent 2021.   OT comments  Pt currently with functional decline compared to Wednesday's OT visit where he was basically supervision without cognitive deficits.  Today he demonstrated decreased sustained attention, was not aware of the day of the week and needed greater physical assist for mobility and balance.  He continued to perseverate on trying to put his arm through his gown on the right side with therapist having to give max demonstrational cueing and physical assist several times to get him to stop as he was not processing or following the command.  With transition to the EOB he needed min to max assist and then would not sustain sitting and attempt to lay down immediately.  He reported slight headache and dizziness still 3/10 which was similar to last visit.  Multiple attempts to grasp his sock with the left hand when donning with continued perseveration as he was undershooting and therapist had to intervene to get him to stop.  Nursing, trauma PA, and PT made aware of cognitive and functional decline.  Feel he will benefit from acute care OT at this time to help regain independence with selfcare tasks, increase cognition and safety, and improve balance.  Depending on reason for decline and extent, he may benefit from intensive inpatient rehab greater than 3hrs/day.  Will continue to follow and update discharge recs as needed.      If plan is discharge home, recommend the following:  Direct supervision/assist for medications management;Direct supervision/assist for financial management;Help with stairs or ramp for entrance;Assist for  transportation;Assistance with cooking/housework;A little help with walking and/or transfers;A little help with bathing/dressing/bathroom   Equipment Recommendations  None recommended by OT       Precautions / Restrictions Precautions Precautions: None;Fall Restrictions Weight Bearing Restrictions: No       Mobility Bed Mobility Overal bed mobility: Needs Assistance Bed Mobility: Supine to Sit, Sit to Supine     Supine to sit: Min assist Sit to supine: Contact guard assist   General bed mobility comments: Assist for supine to sit to bring trunk up to sitting.  Variable from min to max assist secondary to decreased attention and resistance.    Transfers Overall transfer level: Needs assistance Equipment used: None Transfers: Sit to/from Stand Sit to Stand: Min assist     Step pivot transfers: Min assist     General transfer comment: Mod assist initially for standing balance at the EOB and for mobility, but improved slightly to min assist level.  Decreased distance secondary to fatigue and lethargy.     Balance Overall balance assessment: Needs assistance Sitting-balance support: Feet supported Sitting balance-Leahy Scale: Fair     Standing balance support: No upper extremity supported, During functional activity Standing balance-Leahy Scale: Poor Standing balance comment: Pt needs therapist assistance to maintain balance                           ADL either performed or assessed with clinical judgement   ADL Overall ADL's : Needs assistance/impaired  Lower Body Dressing: Minimal assistance;Sitting/lateral leans Lower Body Dressing Details (indicate cue type and reason): to donn gripper socks in sitting Toilet Transfer: Moderate assistance;Ambulation Toilet Transfer Details (indicate cue type and reason): no assistive device         Functional mobility during ADLs: Moderate assistance (no assistive device) General ADL  Comments: Pt much diffferent from last OT visit.  Decreased sustained attention.  Continually, not following commands to stop trying to place his arm throught the gown when attempting to donn it, so therapist could fasten it around his IV.  He needed max assist for initiation to sit up on the side of the bed with balance at supervision level, but then would lay down abruptly to the point therapist had to physically stop him from repeating the action.  Trauma PA and nursing made aware through secure chat.    Extremity/Trunk Assessment Upper Extremity Assessment Upper Extremity Assessment: LUE deficits/detail LUE Deficits / Details: Strength 4/5 for grip but unable to grasp sock, needing multiple attempts secondary to undershooting it. LUE Coordination: decreased fine motor             Cognition Arousal: Lethargic Behavior During Therapy: Flat affect, Restless Overall Cognitive Status: Impaired/Different from baseline Area of Impairment: Orientation, Attention, Problem solving, Memory, Following commands, Safety/judgement, Awareness               Rancho Levels of Cognitive Functioning Rancho Los Amigos Scales of Cognitive Functioning: Confused, Appropriate: Moderate Assistance Orientation Level: Person, Place, Time, Situation Current Attention Level: Focused Memory: Decreased short-term memory Following Commands: Follows one step commands inconsistently Safety/Judgement: Decreased awareness of safety, Decreased awareness of deficits Awareness: Intellectual Problem Solving: Slow processing, Decreased initiation, Difficulty sequencing, Requires verbal cues, Requires tactile cues General Comments: Pt not oriented to day of the week and not able to maintain sustained attention during session.  Decreased cognitive processing and following of commands.  Resistant to staying sitting EOB with continual attempts to lay back down.  Increased perseveration on trying to help donn hospital gown,  needing multiple bouts of physical assist to stop, which he still continued to try. Rancho Mirant Scales of Cognitive Functioning: Confused, Appropriate: Moderate Assistance [VI]                 Pertinent Vitals/ Pain       Pain Assessment Pain Assessment: Faces Faces Pain Scale: Hurts a little bit Pain Location: top of his head when asked Pain Descriptors / Indicators: Headache Pain Intervention(s): Limited activity within patient's tolerance, Monitored during session         Frequency  Min 1X/week        Progress Toward Goals  OT Goals(current goals can now be found in the care plan section)  Progress towards OT goals: Not progressing toward goals - comment (pt with decline since last visit but goals still appropriate at this time)  Acute Rehab OT Goals OT Goal Formulation: With patient/family Time For Goal Achievement: 10/22/23 Potential to Achieve Goals: Good  Plan         AM-PAC OT "6 Clicks" Daily Activity     Outcome Measure   Help from another person eating meals?: A Little Help from another person taking care of personal grooming?: A Little Help from another person toileting, which includes using toliet, bedpan, or urinal?: A Little Help from another person bathing (including washing, rinsing, drying)?: A Little Help from another person to put on and taking off regular upper body clothing?: A Little Help  from another person to put on and taking off regular lower body clothing?: A Lot 6 Click Score: 17    End of Session Equipment Utilized During Treatment: Gait belt  OT Visit Diagnosis: Unsteadiness on feet (R26.81);Other abnormalities of gait and mobility (R26.89);Muscle weakness (generalized) (M62.81);Pain;Other symptoms and signs involving cognitive function Pain - Right/Left:  (headache)   Activity Tolerance Patient limited by fatigue;Patient limited by lethargy   Patient Left in bed;with call bell/phone within reach;with bed alarm set;with  restraints reapplied;Other (comment) (miits placed on bilateral hands)   Nurse Communication Mobility status;Other (comment) (change in medical status)        Time: 1610-9604 OT Time Calculation (min): 20 min  Charges: OT General Charges $OT Visit: 1 Visit OT Treatments $Self Care/Home Management : 8-22 mins  Perrin Maltese, OTR/L Acute Rehabilitation Services  Office [phone] 10/12/2023

## 2023-10-12 NOTE — Progress Notes (Signed)
Patient ID: Charles Dorsey, male   DOB: 1961-04-24, 62 y.o.   MRN: [id number] BP (!) 165/69 (BP Location: Left Arm)   Pulse 68   Temp 98.9 F (37.2 C) (Oral)   Resp 16   Ht 5\' 8"  (1.727 m)   Wt 62.6 kg   SpO2 100%   BMI 20.98 kg/m  Head CT reviewed. Blossoming contusions in right frontal lobe, right temporal lobe Alert, interacting normally with wife, agitated Moving all extremities Perrl, full eom Following commands, oriented x 4

## 2023-10-12 NOTE — Plan of Care (Signed)
  Problem: Safety: Goal: Ability to remain free from injury will improve Outcome: Progressing   Problem: Skin Integrity: Goal: Risk for impaired skin integrity will decrease Outcome: Progressing   

## 2023-10-12 NOTE — Progress Notes (Signed)
0022 PIV access dislodged by pt as pt tossed & turned a lot in bed. Moderate bleed loss noted on pt & bed linen from PIV access site. Hygenic need met. AO x 4, very drowsy, lethargic & communicates less with staff.

## 2023-10-12 NOTE — Plan of Care (Signed)
  Problem: Safety: Goal: Non-violent Restraint(s) Outcome: Progressing   Problem: Nutritional: Goal: Maintenance of adequate nutrition will improve Outcome: Not Progressing

## 2023-10-12 NOTE — Plan of Care (Signed)
  Problem: Safety: Goal: Non-violent Restraint(s) Outcome: Progressing   Problem: Education: Goal: Ability to describe self-care measures that may prevent or decrease complications (Diabetes Survival Skills Education) will improve Outcome: Progressing Goal: Individualized Educational Video(s) Outcome: Progressing   Problem: Coping: Goal: Ability to adjust to condition or change in health will improve Outcome: Progressing   Problem: Fluid Volume: Goal: Ability to maintain a balanced intake and output will improve Outcome: Progressing   Problem: Health Behavior/Discharge Planning: Goal: Ability to identify and utilize available resources and services will improve Outcome: Progressing Goal: Ability to manage health-related needs will improve Outcome: Progressing   Problem: Metabolic: Goal: Ability to maintain appropriate glucose levels will improve Outcome: Progressing   Problem: Nutritional: Goal: Maintenance of adequate nutrition will improve Outcome: Progressing Goal: Progress toward achieving an optimal weight will improve Outcome: Progressing   Problem: Skin Integrity: Goal: Risk for impaired skin integrity will decrease Outcome: Progressing   Problem: Tissue Perfusion: Goal: Adequacy of tissue perfusion will improve Outcome: Progressing   Problem: Education: Goal: Knowledge of General Education information will improve Description: Including pain rating scale, medication(s)/side effects and non-pharmacologic comfort measures Outcome: Progressing   Problem: Health Behavior/Discharge Planning: Goal: Ability to manage health-related needs will improve Outcome: Progressing   Problem: Clinical Measurements: Goal: Ability to maintain clinical measurements within normal limits will improve Outcome: Progressing Goal: Will remain free from infection Outcome: Progressing Goal: Diagnostic test results will improve Outcome: Progressing Goal: Respiratory complications  will improve Outcome: Progressing Goal: Cardiovascular complication will be avoided Outcome: Progressing   Problem: Activity: Goal: Risk for activity intolerance will decrease Outcome: Progressing   Problem: Nutrition: Goal: Adequate nutrition will be maintained Outcome: Progressing   Problem: Coping: Goal: Level of anxiety will decrease Outcome: Progressing   Problem: Elimination: Goal: Will not experience complications related to bowel motility Outcome: Progressing Goal: Will not experience complications related to urinary retention Outcome: Progressing   Problem: Pain Management: Goal: General experience of comfort will improve Outcome: Progressing   Problem: Safety: Goal: Ability to remain free from injury will improve Outcome: Progressing   Problem: Skin Integrity: Goal: Risk for impaired skin integrity will decrease Outcome: Progressing

## 2023-10-12 NOTE — Progress Notes (Addendum)
Nutrition Follow-up  DOCUMENTATION CODES:   Non-severe (moderate) malnutrition in context of chronic illness  INTERVENTION:   -Continue regular diet if patient alert and oriented adequately to safely eat.  -Recommend Ensure Enlive po BID, each supplement provides 350 kcal and 20 grams of protein.  -Continue thiamine 100 mg daily, folic acid 1 mg daily and MVI/Minerals-1 Tab daily for micronutrient support with hx of ETOH use.   NUTRITION DIAGNOSIS:   Moderate Malnutrition related to chronic illness (uncontrolled DM and ETOH use) as evidenced by moderate muscle depletion, moderate fat depletion-ongoing    GOAL:   Patient will meet greater than or equal to 90% of their needs    MONITOR:   PO intake, Weight trends, Supplement acceptance, Skin, Labs, Other (Comment) (need for nutrition support, tube feeding)  REASON FOR ASSESSMENT:   Follow up  ASSESSMENT:   Pt with PMH of DM, HLD, smoker, and daily ETOH use admitted after fall with TBI, SDH, SAH, L temporal bone fx, and L TMJ fx.  11/11-diet advanced to regular 11/12-Cortrak tube removed 11/15-changes in am neur status, and head CT, patient to transfer to neuro PCU  Intakes recorded averaged 64% x 5 meals. Per wife at bedside he was eating very well when diet advanced including a hamburger, then on Wednesday night he became more confused and has only been taking bites of foods, RN confirmed the same today.   Medications reviewed and include colace, folic acid 1 mg daily, novolog SS Q4 hrs, insulin glargine-yfgn, MVI/Minerals-1 Tab daily, sodium chloride 3 gm 3x daily with meals, thiamine 100 mg daily, prn zofran.  Labs: CBG 127-195 past 24 hrs, sodium 126    Diet Order:   Diet Order             Diet regular Room service appropriate? Yes with Assist; Fluid consistency: Thin  Diet effective now                   EDUCATION NEEDS:   Not appropriate for education at this time  Skin:  Skin Assessment: Skin  Integrity Issues: Skin Integrity Issues:: Other (Comment) Other: left arm abrasion  Last BM:  10/07/23  Height:   Ht Readings from Last 1 Encounters:  10/07/23 5\' 8"  (1.727 m)    Weight:   Wt Readings from Last 1 Encounters:  10/12/23 62.6 kg    Ideal Body Weight:     BMI:  Body mass index is 20.98 kg/m.  Estimated Nutritional Needs:   Kcal:  1900-2100  Protein:  90-120 grams  Fluid:  >1.9 L/day    Alvino Chapel, RDLD Clinical Dietitian See AMION for contact information

## 2023-10-12 NOTE — Progress Notes (Signed)
Progress Note     Subjective: More drowsy and requiring more encouragement and cues on my exam this am as compared to yesterday. Last ativan 11/14 1200. Wife is at bedside and also notes worse mental status. He is alert with encouragement and oriented x3. He continues to have headache the same as yesterday. Denies vision changes. OT also reached out and noted acute decline in cognitive function  Objective: Vital signs in last 24 hours: Temp:  [97.6 F (36.4 C)-98.2 F (36.8 C)] 98.2 F (36.8 C) (11/15 0816) Pulse Rate:  [55-69] 55 (11/15 0816) Resp:  [16-18] 16 (11/15 0816) BP: (153-173)/(59-85) 173/80 (11/15 0816) SpO2:  [94 %-100 %] 99 % (11/15 0816) Weight:  [62.6 kg] 62.6 kg (11/15 0631) Last BM Date :  (pt would not tell me)  Intake/Output from previous day: 11/14 0701 - 11/15 0700 In: 284.8 [I.V.:284.8] Out: 401 [Urine:400; Emesis/NG output:1] Intake/Output this shift: No intake/output data recorded.  PE: General: pleasant, WD, male who is laying in bed in NAD HEENT: head is normocephalic, atraumatic.  Sclera are noninjected.  Pupils equal, round, and reactive to light. He does not track past midline to left.  Ears and nose without any masses or lesions.  Mouth is pink and moist. There is bright red blood on pillow case Lungs:  Respiratory effort nonlabored on room air. CTAB Abd: soft, NT, ND MSK: all 4 extremities are symmetrical with no cyanosis, clubbing, or edema. Neuro: asymmetric smile with left mouth droop. EOMs not intact. - does not track past midline to left. MAE and sensation intact b/l upper and lower extremities. Unable to assess arm drift due to inattention Skin: warm and dry Psych: A&Ox3. drowsy   Lab Results:  Recent Labs    10/11/23 1005 10/12/23 0715  WBC 11.3* 12.2*  HGB 15.1 14.9  HCT 41.4 39.8  PLT 221 233   BMET Recent Labs    10/11/23 1005 10/12/23 0648  NA 125* 126*  K 3.6 4.0  CL 93* 93*  CO2 25 21*  GLUCOSE 137* 161*  BUN 16  13  CREATININE 0.91 0.50*  CALCIUM 8.9 9.0   PT/INR No results for input(s): "LABPROT", "INR" in the last 72 hours. CMP     Component Value Date/Time   NA 126 (L) 10/12/2023 0648   K 4.0 10/12/2023 0648   CL 93 (L) 10/12/2023 0648   CO2 21 (L) 10/12/2023 0648   GLUCOSE 161 (H) 10/12/2023 0648   BUN 13 10/12/2023 0648   CREATININE 0.50 (L) 10/12/2023 0648   CALCIUM 9.0 10/12/2023 0648   PROT 6.8 10/07/2023 1315   ALBUMIN 3.8 10/07/2023 1315   AST 78 (H) 10/07/2023 1315   ALT 56 (H) 10/07/2023 1315   ALKPHOS 134 (H) 10/07/2023 1315   BILITOT 1.0 10/07/2023 1315   GFRNONAA >60 10/12/2023 0648   GFRAA >60 03/19/2020 0355   Lipase     Component Value Date/Time   LIPASE 153 (H) 12/21/2020 2536       Studies/Results: No results found.  Anti-infectives: Anti-infectives (From admission, onward)    None        Assessment/Plan Fall from a cart   TBI/SDH/SAH - NSGY c/s, Dr. Franky Macho, TBI team therapies. MS decline over last 24-48 hours. Last CT 11/11 overall stable. Concerning neuro exam this am. Stat CT H this am Agitation - resolved Left temporal bone fracture, L TMJ FX -Dr. Elijah Birk recs for Ciprodex drops,  ETOH use daily - CIWA DM - SSI HTN -  home meds reordered. Persistently hypertensive Hyponatremia - Na 126 from 125 after 2 g salt tab yesterday. Increase to 2g tid FEN - diet, salt tabs and NS IVF for hyponatremia DVT - SCDs, LMWH Dispo - med surg, I spoke with his wife. CT head  I reviewed last 24 h vitals and pain scores, last 48 h intake and output, last 24 h labs and trends, and last 24 h imaging results.   LOS: 5 days   Eric Form, [facility] Surgery 10/12/2023, 9:49 AM Please see Amion for pager number during day hours 7:00am-4:30pm

## 2023-10-13 ENCOUNTER — Inpatient Hospital Stay (HOSPITAL_COMMUNITY): Payer: BC Managed Care – PPO

## 2023-10-13 LAB — BASIC METABOLIC PANEL
Anion gap: 11 (ref 5–15)
BUN: 20 mg/dL (ref 6–20)
CO2: 19 mmol/L — ABNORMAL LOW (ref 22–32)
Calcium: 8.8 mg/dL — ABNORMAL LOW (ref 8.9–10.3)
Chloride: 90 mmol/L — ABNORMAL LOW (ref 98–111)
Creatinine, Ser: 0.71 mg/dL (ref 0.61–1.24)
GFR, Estimated: >60 mL/min (ref >60)
Glucose, Bld: 165 mg/dL — ABNORMAL HIGH (ref 70–99)
Potassium: 3.9 mmol/L (ref 3.5–5.1)
Sodium: 120 mmol/L — ABNORMAL LOW (ref 135–145)

## 2023-10-13 LAB — GLUCOSE, CAPILLARY
Glucose-Capillary: 137 mg/dL — ABNORMAL HIGH (ref 70–99)
Glucose-Capillary: 175 mg/dL — ABNORMAL HIGH (ref 70–99)
Glucose-Capillary: 140 mg/dL — ABNORMAL HIGH (ref 70–99)
Glucose-Capillary: 144 mg/dL — ABNORMAL HIGH (ref 70–99)
Glucose-Capillary: 113 mg/dL — ABNORMAL HIGH (ref 70–99)
Glucose-Capillary: 125 mg/dL — ABNORMAL HIGH (ref 70–99)

## 2023-10-13 LAB — SODIUM
Sodium: 122 mmol/L — ABNORMAL LOW (ref 135–145)
Sodium: 126 mmol/L — ABNORMAL LOW (ref 135–145)
Sodium: 128 mmol/L — ABNORMAL LOW (ref 135–145)

## 2023-10-13 LAB — SODIUM, URINE, RANDOM: Sodium, Ur: 90 mmol/L

## 2023-10-13 LAB — OSMOLALITY: Osmolality: 268 mosm/kg — ABNORMAL LOW (ref 275–295)

## 2023-10-13 LAB — OSMOLALITY, URINE: Osmolality, Ur: 674 mosm/kg (ref 300–900)

## 2023-10-13 MED ORDER — FLUMAZENIL 0.5 MG/5ML IV SOLN
0.2000 mg | Freq: Once | INTRAVENOUS | Status: AC
  Administered 2023-10-13: 0.2 mg via INTRAVENOUS
  Filled 2023-10-13: qty 5

## 2023-10-13 MED ORDER — SODIUM CHLORIDE 3 % IV SOLN
INTRAVENOUS | Status: DC
  Filled 2023-10-13 (×3): qty 500

## 2023-10-13 MED ORDER — LEVETIRACETAM IN NACL 500 MG/100ML IV SOLN
500.0000 mg | Freq: Two times a day (BID) | INTRAVENOUS | Status: DC
  Administered 2023-10-13 – 2023-10-15 (×6): 500 mg via INTRAVENOUS
  Filled 2023-10-13 (×6): qty 100

## 2023-10-13 NOTE — Progress Notes (Signed)
PT Cancellation Note  Patient Details Name: Charles Dorsey MRN: [id number] DOB: 06-17-1961   Cancelled Treatment:    Reason Eval/Treat Not Completed: Medical issues which prohibited therapy. Decreased arousal level. Stat CT ordered which revealed stable to slightly increased size of hemorrhagic contusions in the right frontal and temporal lobes, slightly increased leftward midline shift, now 7 mm, and unchanged subarachnoid and subdural hemorrhage. Transferred from 4np to 4n. PT to continue to follow.   Ilda Foil 10/13/2023, 10:52 AM

## 2023-10-13 NOTE — Progress Notes (Signed)
Subjective/Chief Complaint: Pt transferred to Prog yesterday for closer neuro checks   Objective: Vital signs in last 24 hours: Temp:  [98 F (36.7 C)-98.9 F (37.2 C)] 98.9 F (37.2 C) (11/16 0343) Pulse Rate:  [60-83] 83 (11/16 0343) Resp:  [14-18] 14 (11/16 0343) BP: (138-181)/(67-91) 138/74 (11/16 0343) SpO2:  [97 %-100 %] 97 % (11/16 0343) Weight:  [62.6 kg] 62.6 kg (11/16 0500) Last BM Date :  (PTA)  Intake/Output from previous day: 11/15 0701 - 11/16 0700 In: 240 [P.O.:240] Out: 650 [Urine:650] Intake/Output this shift: No intake/output data recorded.  General: asleep, WD, male who is laying in bed in NAD HEENT: head is normocephalic, atraumatic.  Sclera are noninjected.  Pupils equal, round, and reactive to light. He does not track past midline to left.  Ears and nose without any masses or lesions.  Mouth is pink and moist. There is bright red blood on pillow case Lungs:  Respiratory effort nonlabored on room air. CTAB Abd: soft, NT, ND MSK: all 4 extremities are symmetrical with no cyanosis, clubbing, or edema. Neuro: asymmetric smile with left mouth droop. EOMs not intact. - does not track past midline to left. Pt with weakness to prox LUE and LLE Skin: warm and dry Psych: A&Ox3. drowsy  Lab Results:  Recent Labs    10/11/23 1005 10/12/23 0715  WBC 11.3* 12.2*  HGB 15.1 14.9  HCT 41.4 39.8  PLT 221 233   BMET Recent Labs    10/11/23 1005 10/12/23 0648  NA 125* 126*  K 3.6 4.0  CL 93* 93*  CO2 25 21*  GLUCOSE 137* 161*  BUN 16 13  CREATININE 0.91 0.50*  CALCIUM 8.9 9.0   PT/INR No results for input(s): "LABPROT", "INR" in the last 72 hours. ABG No results for input(s): "PHART", "HCO3" in the last 72 hours.  Invalid input(s): "PCO2", "PO2"  Studies/Results: CT HEAD WO CONTRAST ( )  Result Date: 10/12/2023 CLINICAL DATA:  Head trauma, focal neuro findings (Age 37-64y) change in mental status and neuro exam. EXAM: CT HEAD WITHOUT  CONTRAST TECHNIQUE: Contiguous axial images were obtained from the base of the skull through the vertex without intravenous contrast. RADIATION DOSE REDUCTION: This exam was performed according to the departmental dose-optimization program which includes automated exposure control, adjustment of the mA and/or kV according to patient size and/or use of iterative reconstruction technique. COMPARISON:  Head CT 10/08/2023. FINDINGS: Brain: Interval progression of hemorrhagic contusions along anterior inferior right frontal lobe and right temporal lobe (axial image 27 series 6). Newly apparent interhemispheric subdural hematoma layering along the bilateral tentorium. Slightly more conspicuous subarachnoid hemorrhage along the right cerebral convexity. Unchanged hemorrhagic contusions within the right frontal operculum and along the right superior frontal sulcus. New 5 mm of leftward midline shift and early right uncal herniation. No new loss of gray-white differentiation. No hydrocephalus. Vascular: No hyperdense vessel or unexpected calcification. Skull: Unchanged nondisplaced left temporal bone fracture extending into the left parietal bone with associated partial opacification of the left mastoid and middle ear cavity. Sinuses/Orbits: Partial opacification of the sphenoid sinuses. Orbits are unremarkable. Other: None. IMPRESSION: 1. Interval progression of hemorrhagic contusions along the anterior inferior right frontal lobe and right temporal lobe. New 5 mm of leftward midline shift and early right uncal herniation. 2. Newly apparent interhemispheric subdural hematoma layering along the bilateral tentorium. 3. Slightly more conspicuous subarachnoid hemorrhage along the right cerebral convexity. 4. Unchanged hemorrhagic contusions within the right frontal operculum and along the right superior  frontal sulcus. 5. Unchanged nondisplaced left temporal bone fracture extending into the left parietal bone with associated  partial opacification of the left mastoid and middle ear cavity. Electronically Signed   By: Orvan Falconer M.D.   On: 10/12/2023 12:25     Assessment/Plan: Fall from a cart   TBI/SDH/SAH - NSGY c/s, Dr. Franky Macho, TBI team therapies. MS decline over last 24-48 hours. Last CT 11/11 overall stable. Concerning neuro exam this am with weakness in prox LUE/LLE.  Drowsy this AM.  Will reverse Ativan this AM  Agitation - resolved Left temporal bone fracture, L TMJ FX -Dr. Elijah Birk recs for Ciprodex drops,  ETOH use daily - CIWA DM - SSI HTN - home meds reordered. Persistently hypertensive Hyponatremia - Na 126 from 125 after 2 g salt tab yesterday. Increase to 2g tid FEN - diet, salt tabs and NS IVF for hyponatremia, BMP pending DVT - SCDs, LMWH Dispo - med surg, I spoke with his wife and son.    I reviewed last 24 h vitals and pain scores, last 48 h intake and output, last 24 h labs and trends, and last 24 h imaging results.  LOS: 6 days    Axel Filler 10/13/2023

## 2023-10-13 NOTE — Plan of Care (Signed)
  Problem: Coping: Goal: Ability to adjust to condition or change in health will improve Outcome: Progressing   Problem: Fluid Volume: Goal: Ability to maintain a balanced intake and output will improve Outcome: Progressing   Problem: Health Behavior/Discharge Planning: Goal: Ability to identify and utilize available resources and services will improve Outcome: Progressing Goal: Ability to manage health-related needs will improve Outcome: Progressing   Problem: Metabolic: Goal: Ability to maintain appropriate glucose levels will improve Outcome: Progressing   Problem: Nutritional: Goal: Progress toward achieving an optimal weight will improve Outcome: Progressing   Problem: Skin Integrity: Goal: Risk for impaired skin integrity will decrease Outcome: Progressing   Problem: Tissue Perfusion: Goal: Adequacy of tissue perfusion will improve Outcome: Progressing   Problem: Education: Goal: Knowledge of General Education information will improve Description: Including pain rating scale, medication(s)/side effects and non-pharmacologic comfort measures Outcome: Progressing   Problem: Health Behavior/Discharge Planning: Goal: Ability to manage health-related needs will improve Outcome: Progressing   Problem: Clinical Measurements: Goal: Ability to maintain clinical measurements within normal limits will improve Outcome: Progressing Goal: Will remain free from infection Outcome: Progressing Goal: Diagnostic test results will improve Outcome: Progressing Goal: Respiratory complications will improve Outcome: Progressing Goal: Cardiovascular complication will be avoided Outcome: Progressing   Problem: Coping: Goal: Level of anxiety will decrease Outcome: Progressing   Problem: Pain Management: Goal: General experience of comfort will improve Outcome: Progressing   Problem: Safety: Goal: Ability to remain free from injury will improve Outcome: Progressing   Problem:  Education: Goal: Ability to describe self-care measures that may prevent or decrease complications (Diabetes Survival Skills Education) will improve Outcome: Not Progressing Goal: Individualized Educational Video(s) Outcome: Not Progressing   Problem: Nutritional: Goal: Maintenance of adequate nutrition will improve Outcome: Not Progressing   Problem: Activity: Goal: Risk for activity intolerance will decrease Outcome: Not Progressing   Problem: Nutrition: Goal: Adequate nutrition will be maintained Outcome: Not Progressing   Problem: Elimination: Goal: Will not experience complications related to bowel motility Outcome: Not Progressing Goal: Will not experience complications related to urinary retention Outcome: Not Progressing

## 2023-10-13 NOTE — Progress Notes (Signed)
0850-Upon assessment noted difficult to arouse. Patient responded to pain. Left sided weakness, partial hemianopia in left eye/flaccid pupils round, equal, sluggish to light. VS WDL. Dr. Derrell Lolling notified at bedside. Neuro surgery paged. All PO meds held.   0910-See new orders. Lovenox held. STAT CT. Transfer to ICU.   0950-Transported with nurse at bedside to CT without incident.

## 2023-10-13 NOTE — Progress Notes (Signed)
1030-Patient transferred to ICU. Report given to Marissa RN at bedside. All questions answered. Patient belongings with family at bedside.

## 2023-10-13 NOTE — Progress Notes (Signed)
Patient with episode earlier this morning with sudden onset of unresponsiveness.  This is now resolved.  He now awakens.  Moves both sides reasonably equally.  Follow-up head CT scan demonstrates continued right sided cerebral contusions with associated edema.  Sodium is now dropped to 125.  Patient is suffering symptoms secondary to irritability secondary to his progressive hyponatremia and traumatic brain injury.  Plan to correct hyponatremia with 3% saline.  Transfer to unit.  Stop Eliquis for now.  Restart Keppra.

## 2023-10-14 LAB — SODIUM
Sodium: 129 mmol/L — ABNORMAL LOW (ref 135–145)
Sodium: 132 mmol/L — ABNORMAL LOW (ref 135–145)
Sodium: 132 mmol/L — ABNORMAL LOW (ref 135–145)
Sodium: 134 mmol/L — ABNORMAL LOW (ref 135–145)
Sodium: 134 mmol/L — ABNORMAL LOW (ref 135–145)

## 2023-10-14 LAB — GLUCOSE, CAPILLARY
Glucose-Capillary: 105 mg/dL — ABNORMAL HIGH (ref 70–99)
Glucose-Capillary: 113 mg/dL — ABNORMAL HIGH (ref 70–99)
Glucose-Capillary: 118 mg/dL — ABNORMAL HIGH (ref 70–99)
Glucose-Capillary: 144 mg/dL — ABNORMAL HIGH (ref 70–99)
Glucose-Capillary: 175 mg/dL — ABNORMAL HIGH (ref 70–99)
Glucose-Capillary: 264 mg/dL — ABNORMAL HIGH (ref 70–99)

## 2023-10-14 LAB — BASIC METABOLIC PANEL
Anion gap: 7 (ref 5–15)
BUN: 17 mg/dL (ref 6–20)
CO2: 18 mmol/L — ABNORMAL LOW (ref 22–32)
Calcium: 8.1 mg/dL — ABNORMAL LOW (ref 8.9–10.3)
Chloride: 106 mmol/L (ref 98–111)
Creatinine, Ser: 0.68 mg/dL (ref 0.61–1.24)
GFR, Estimated: >60 mL/min (ref >60)
Glucose, Bld: 106 mg/dL — ABNORMAL HIGH (ref 70–99)
Potassium: 3.6 mmol/L (ref 3.5–5.1)
Sodium: 131 mmol/L — ABNORMAL LOW (ref 135–145)

## 2023-10-14 LAB — PHOSPHORUS: Phosphorus: 2.2 mg/dL — ABNORMAL LOW (ref 2.5–4.6)

## 2023-10-14 LAB — MAGNESIUM: Magnesium: 2.1 mg/dL (ref 1.7–2.4)

## 2023-10-14 MED ORDER — K PHOS MONO-SOD PHOS DI & MONO 155-852-130 MG PO TABS
500.0000 mg | ORAL_TABLET | Freq: Two times a day (BID) | ORAL | Status: AC
  Administered 2023-10-14 (×2): 500 mg via ORAL
  Filled 2023-10-14 (×2): qty 2

## 2023-10-14 MED ORDER — SODIUM CHLORIDE 1 G PO TABS
2.0000 g | ORAL_TABLET | Freq: Two times a day (BID) | ORAL | Status: DC
  Administered 2023-10-14 – 2023-10-15 (×4): 2 g via ORAL
  Filled 2023-10-14 (×4): qty 2

## 2023-10-14 NOTE — Plan of Care (Signed)
  Problem: Education: Goal: Ability to describe self-care measures that may prevent or decrease complications (Diabetes Survival Skills Education) will improve Outcome: Progressing Goal: Individualized Educational Video(s) Outcome: Progressing   Problem: Coping: Goal: Ability to adjust to condition or change in health will improve Outcome: Progressing   Problem: Fluid Volume: Goal: Ability to maintain a balanced intake and output will improve Outcome: Progressing   Problem: Health Behavior/Discharge Planning: Goal: Ability to identify and utilize available resources and services will improve Outcome: Progressing Goal: Ability to manage health-related needs will improve Outcome: Progressing   Problem: Metabolic: Goal: Ability to maintain appropriate glucose levels will improve Outcome: Progressing   Problem: Nutritional: Goal: Maintenance of adequate nutrition will improve Outcome: Progressing Goal: Progress toward achieving an optimal weight will improve Outcome: Progressing   Problem: Skin Integrity: Goal: Risk for impaired skin integrity will decrease Outcome: Progressing   Problem: Tissue Perfusion: Goal: Adequacy of tissue perfusion will improve Outcome: Progressing   Problem: Education: Goal: Knowledge of General Education information will improve Description: Including pain rating scale, medication(s)/side effects and non-pharmacologic comfort measures Outcome: Progressing   Problem: Health Behavior/Discharge Planning: Goal: Ability to manage health-related needs will improve Outcome: Progressing   Problem: Clinical Measurements: Goal: Ability to maintain clinical measurements within normal limits will improve Outcome: Progressing Goal: Will remain free from infection Outcome: Progressing Goal: Diagnostic test results will improve Outcome: Progressing Goal: Respiratory complications will improve Outcome: Progressing Goal: Cardiovascular complication will  be avoided Outcome: Progressing   Problem: Activity: Goal: Risk for activity intolerance will decrease Outcome: Progressing   Problem: Nutrition: Goal: Adequate nutrition will be maintained Outcome: Progressing   Problem: Coping: Goal: Level of anxiety will decrease Outcome: Progressing   Problem: Elimination: Goal: Will not experience complications related to bowel motility Outcome: Progressing Goal: Will not experience complications related to urinary retention Outcome: Progressing   Problem: Pain Management: Goal: General experience of comfort will improve Outcome: Progressing   Problem: Safety: Goal: Ability to remain free from injury will improve Outcome: Progressing   Problem: Skin Integrity: Goal: Risk for impaired skin integrity will decrease Outcome: Progressing   Problem: Education: Goal: Knowledge of the prescribed therapeutic regimen Outcome: Progressing Goal: Knowledge of disease or condition will improve Outcome: Progressing   Problem: Clinical Measurements: Goal: Neurologic status will improve Outcome: Progressing   Problem: Tissue Perfusion: Goal: Ability to maintain intracranial pressure will improve Outcome: Progressing   Problem: Respiratory: Goal: Will regain and/or maintain adequate ventilation Outcome: Progressing   Problem: Skin Integrity: Goal: Risk for impaired skin integrity will decrease Outcome: Progressing Goal: Demonstration of wound healing without infection will improve Outcome: Progressing   Problem: Psychosocial: Goal: Ability to verbalize positive feelings about self will improve Outcome: Progressing Goal: Ability to participate in self-care as condition permits will improve Outcome: Progressing Goal: Ability to identify appropriate support needs will improve Outcome: Progressing

## 2023-10-14 NOTE — Progress Notes (Signed)
Patient looks significantly better today.  He is awake and aware.  Follows commands bilaterally.  Strength symmetric.  Sodium 131.  3% saline decreased.  Status post traumatic brain injury with symptomatic hyponatremia.  Patient has responded well to hypertonic saline.  Continue Keppra.

## 2023-10-14 NOTE — Progress Notes (Signed)
Trauma/Critical Care Follow Up Note  Subjective:    Overnight Issues:   Objective:  Vital signs for last 24 hours: Temp:  [97.6 F (36.4 C)-101 F (38.3 C)] 100 F (37.8 C) (11/17 0400) Pulse Rate:  [66-83] 81 (11/17 0800) Resp:  [10-28] 14 (11/17 0800) BP: (131-175)/(65-87) 131/81 (11/17 0713) SpO2:  [95 %-98 %] 97 % (11/17 0800) Weight:  [60 kg] 60 kg (11/17 0500)  Hemodynamic parameters for last 24 hours:    Intake/Output from previous day: 11/16 0701 - 11/17 0700 In: 1176.5 [I.V.:976.5; IV Piggyback:200] Out: 1555 [Urine:1555]  Intake/Output this shift: Total I/O In: 49.9 [I.V.:49.9] Out: -   Vent settings for last 24 hours:    Physical Exam:  Gen: comfortable, no distress Neuro: follows commands, but requires lots of stimulation  HEENT: PERRL Neck: supple CV: RRR Pulm: unlabored breathing on RA Abd: soft, NT    GU: urine clear and yellow, +spontaneous voids Extr: wwp, no edema  Results for orders placed or performed during the hospital encounter of 10/07/23 (from the past 24 hour(s))  Glucose, capillary     Status: Abnormal   Collection Time: 10/13/23  8:55 AM  Result Value Ref Range   Glucose-Capillary 175 (H) 70 - 99 mg/dL  Basic metabolic panel     Status: Abnormal   Collection Time: 10/13/23  9:32 AM  Result Value Ref Range   Sodium 120 (L) 135 - 145 mmol/L   Potassium 3.9 3.5 - 5.1 mmol/L   Chloride 90 (L) 98 - 111 mmol/L   CO2 19 (L) 22 - 32 mmol/L   Glucose, Bld 165 (H) 70 - 99 mg/dL   BUN 20 6 - 20 mg/dL   Creatinine, Ser 7.82 0.61 - 1.24 mg/dL   Calcium 8.8 (L) 8.9 - 10.3 mg/dL   GFR, Estimated >95 >62 mL/min   Anion gap 11 5 - 15  Osmolality     Status: Abnormal   Collection Time: 10/13/23  9:33 AM  Result Value Ref Range   Osmolality 268 (L) 275 - 295 mOsm/kg  Osmolality, urine     Status: None   Collection Time: 10/13/23 11:01 AM  Result Value Ref Range   Osmolality, Ur 674 300 - 900 mOsm/kg  Sodium, urine, random     Status:  None   Collection Time: 10/13/23 11:01 AM  Result Value Ref Range   Sodium, Ur 90 mmol/L  Sodium     Status: Abnormal   Collection Time: 10/13/23 11:40 AM  Result Value Ref Range   Sodium 122 (L) 135 - 145 mmol/L  Glucose, capillary     Status: Abnormal   Collection Time: 10/13/23 11:56 AM  Result Value Ref Range   Glucose-Capillary 140 (H) 70 - 99 mg/dL  Sodium     Status: Abnormal   Collection Time: 10/13/23  3:21 PM  Result Value Ref Range   Sodium 126 (L) 135 - 145 mmol/L  Glucose, capillary     Status: Abnormal   Collection Time: 10/13/23  4:40 PM  Result Value Ref Range   Glucose-Capillary 144 (H) 70 - 99 mg/dL  Glucose, capillary     Status: Abnormal   Collection Time: 10/13/23  8:06 PM  Result Value Ref Range   Glucose-Capillary 113 (H) 70 - 99 mg/dL  Sodium     Status: Abnormal   Collection Time: 10/13/23  9:11 PM  Result Value Ref Range   Sodium 128 (L) 135 - 145 mmol/L  Glucose, capillary  Status: Abnormal   Collection Time: 10/13/23 11:29 PM  Result Value Ref Range   Glucose-Capillary 125 (H) 70 - 99 mg/dL  Sodium     Status: Abnormal   Collection Time: 10/14/23  1:00 AM  Result Value Ref Range   Sodium 129 (L) 135 - 145 mmol/L  Glucose, capillary     Status: Abnormal   Collection Time: 10/14/23  3:28 AM  Result Value Ref Range   Glucose-Capillary 113 (H) 70 - 99 mg/dL  Basic metabolic panel     Status: Abnormal   Collection Time: 10/14/23  5:24 AM  Result Value Ref Range   Sodium 131 (L) 135 - 145 mmol/L   Potassium 3.6 3.5 - 5.1 mmol/L   Chloride 106 98 - 111 mmol/L   CO2 18 (L) 22 - 32 mmol/L   Glucose, Bld 106 (H) 70 - 99 mg/dL   BUN 17 6 - 20 mg/dL   Creatinine, Ser 6.04 0.61 - 1.24 mg/dL   Calcium 8.1 (L) 8.9 - 10.3 mg/dL   GFR, Estimated >54 >09 mL/min   Anion gap 7 5 - 15  Glucose, capillary     Status: Abnormal   Collection Time: 10/14/23  7:39 AM  Result Value Ref Range   Glucose-Capillary 118 (H) 70 - 99 mg/dL    Assessment &  Plan: The plan of care was discussed with the bedside nurse for the day, Rolly Salter, who is in agreement with this plan and no additional concerns were raised.   Present on Admission:  SDH (subdural hematoma) (HCC)    LOS: 7 days   Additional comments:I reviewed the patient's new clinical lab test results.   and I reviewed the patients new imaging test results.    Fall from a cart   TBI/SDH/SAH - NSGY c/s, Dr. Franky Macho, TBI team therapies. MS decline over last 24-48 hours. Last CT 11/11 overall stable. Concerning neuro exam this am with weakness in prox LUE/LLE.  Drowsy yest AM, refractory to flumazenil. Repeat head CT with slight increased IPH and MLS to 7mm.  Agitation - resolved Left temporal bone fracture, L TMJ FX -Dr. Elijah Birk recs for Ciprodex drops,  ETOH use daily - CIWA DM - SSI HTN - home meds reordered. Persistently hypertensive Hyponatremia - txf to ICU 11/16 for HTS, Na 131 this AM, resume PO salt tabs FEN - diet, salt tabs and HTS hyponatremia, poor PO due to sleepiness- may need cortrak DVT - SCDs, LMWH Dispo - med surg, I spoke with his wife at bedside  Critical Care Total Time: 35 minutes  Diamantina Monks, MD Trauma & General Surgery Please use [email or web address] to contact on call provider  10/14/2023  *Care during the described time interval was provided by me. I have reviewed this patient's available data, including medical history, events of note, physical examination and test results as part of my evaluation.

## 2023-10-15 LAB — PHOSPHORUS: Phosphorus: 3 mg/dL (ref 2.5–4.6)

## 2023-10-15 LAB — BASIC METABOLIC PANEL
Anion gap: 6 (ref 5–15)
BUN: 11 mg/dL (ref 6–20)
CO2: 23 mmol/L (ref 22–32)
Calcium: 8.1 mg/dL — ABNORMAL LOW (ref 8.9–10.3)
Chloride: 106 mmol/L (ref 98–111)
Creatinine, Ser: 0.8 mg/dL (ref 0.61–1.24)
GFR, Estimated: >60 mL/min (ref >60)
Glucose, Bld: 111 mg/dL — ABNORMAL HIGH (ref 70–99)
Potassium: 3.1 mmol/L — ABNORMAL LOW (ref 3.5–5.1)
Sodium: 135 mmol/L (ref 135–145)

## 2023-10-15 LAB — SODIUM
Sodium: 136 mmol/L (ref 135–145)
Sodium: 137 mmol/L (ref 135–145)
Sodium: 134 mmol/L — ABNORMAL LOW (ref 135–145)

## 2023-10-15 LAB — GLUCOSE, CAPILLARY
Glucose-Capillary: 93 mg/dL (ref 70–99)
Glucose-Capillary: 112 mg/dL — ABNORMAL HIGH (ref 70–99)
Glucose-Capillary: 226 mg/dL — ABNORMAL HIGH (ref 70–99)
Glucose-Capillary: 188 mg/dL — ABNORMAL HIGH (ref 70–99)
Glucose-Capillary: 190 mg/dL — ABNORMAL HIGH (ref 70–99)
Glucose-Capillary: 147 mg/dL — ABNORMAL HIGH (ref 70–99)

## 2023-10-15 LAB — CALCIUM, IONIZED: Calcium, Ionized, Serum: 4.9 mg/dL (ref 4.5–5.6)

## 2023-10-15 MED ORDER — ENOXAPARIN SODIUM 30 MG/0.3ML IJ SOSY
30.0000 mg | PREFILLED_SYRINGE | Freq: Two times a day (BID) | INTRAMUSCULAR | Status: DC
  Administered 2023-10-15 – 2023-10-19 (×8): 30 mg via SUBCUTANEOUS
  Filled 2023-10-15 (×8): qty 0.3

## 2023-10-15 MED ORDER — BISACODYL 5 MG PO TBEC
10.0000 mg | DELAYED_RELEASE_TABLET | Freq: Once | ORAL | Status: AC
  Administered 2023-10-15: 10 mg via ORAL
  Filled 2023-10-15: qty 2

## 2023-10-15 MED ORDER — POTASSIUM CHLORIDE 20 MEQ PO PACK
40.0000 meq | PACK | ORAL | Status: AC
  Administered 2023-10-15 (×2): 40 meq via ORAL
  Filled 2023-10-15 (×2): qty 2

## 2023-10-15 MED ORDER — MAGNESIUM HYDROXIDE 400 MG/5ML PO SUSP
30.0000 mL | Freq: Once | ORAL | Status: AC
  Administered 2023-10-15: 30 mL via ORAL
  Filled 2023-10-15: qty 30

## 2023-10-15 MED ORDER — POLYETHYLENE GLYCOL 3350 17 G PO PACK
17.0000 g | PACK | Freq: Two times a day (BID) | ORAL | Status: DC
  Administered 2023-10-15 – 2023-10-19 (×7): 17 g via ORAL
  Filled 2023-10-15 (×7): qty 1

## 2023-10-15 MED ORDER — BISACODYL 10 MG RE SUPP: 10.0000 mg | Freq: Once | RECTAL | Status: DC

## 2023-10-15 MED ORDER — MAGNESIUM CITRATE PO SOLN
1.0000 | Freq: Once | ORAL | Status: AC
  Administered 2023-10-15: 1 via ORAL
  Filled 2023-10-15: qty 296

## 2023-10-15 MED ORDER — SENNA 8.6 MG PO TABS
2.0000 | ORAL_TABLET | Freq: Once | ORAL | Status: AC
  Administered 2023-10-15: 17.2 mg via ORAL
  Filled 2023-10-15: qty 2

## 2023-10-15 NOTE — Progress Notes (Signed)
Physical Therapy Treatment Patient Details Name: Charles Dorsey MRN: [id number] DOB: Dec 05, 1960 Today's Date: 10/15/2023   History of Present Illness Patient is a 62 yo male presents to [facility] on 11/10 with fall from cart while staining a deck, +ETOH. Pt sustained traumatic R SAH, R SDH,  L temporal bone fx, L TMJ fx. Head CT 11/16 slightly increased size of hemorrhagic contusions in the Rfrontal and temporal lobes, and slightly increased L midline shift now 7mm; transfer to ICU 11/16 for hypertonic saline. PMH includes DMII, HLD, coronary stent 2021.    PT Comments  Pt more impaired balance-wise today, states he feels very dizzy. Pt with multiple bouts of near-scissoring during hallway mobility and requires at least min steadying assist throughout. Pt struggling most with directional changes and head turns during gait. Pt lacking insight into deficits and with poor safety awareness at this time. Pt would benefit from intensive therapies post-acutely to address deficits.     If plan is discharge home, recommend the following: A little help with walking and/or transfers;A little help with bathing/dressing/bathroom   Can travel by private vehicle        Equipment Recommendations  None recommended by PT    Recommendations for Other Services       Precautions / Restrictions Precautions Precautions: Fall Restrictions Weight Bearing Restrictions: No     Mobility  Bed Mobility Overal bed mobility: Needs Assistance Bed Mobility: Supine to Sit     Supine to sit: Min assist     General bed mobility comments: assist for trunk elevation    Transfers Overall transfer level: Needs assistance Equipment used: 1 person hand held assist Transfers: Sit to/from Stand Sit to Stand: Min assist           General transfer comment: assist for rise and steady, stand x2 from EOB and toilet    Ambulation/Gait Ambulation/Gait assistance: Min assist Gait Distance (Feet): 100 Feet Assistive  device: 1 person hand held assist Gait Pattern/deviations: Step-through pattern, Decreased stride length, Scissoring, Ataxic, Staggering left Gait velocity: decr     General Gait Details: staggering towards L with head turns towards L especially, several episodes of near-scissoring with reports of dizziness throughout   Stairs             Wheelchair Mobility     Tilt Bed    Modified Rankin (Stroke Patients Only)       Balance Overall balance assessment: Needs assistance Sitting-balance support: Feet supported Sitting balance-Leahy Scale: Fair     Standing balance support: No upper extremity supported, During functional activity Standing balance-Leahy Scale: Poor Standing balance comment: Pt needs therapist assistance to maintain balance                            Cognition Arousal: Alert Behavior During Therapy: Flat affect, Restless Overall Cognitive Status: Impaired/Different from baseline Area of Impairment: Attention, Problem solving, Memory, Following commands, Safety/judgement, Awareness               Rancho Levels of Cognitive Functioning Rancho Los Amigos Scales of Cognitive Functioning: Confused, Appropriate: Moderate Assistance   Current Attention Level: Sustained Memory: Decreased short-term memory Following Commands: Follows one step commands inconsistently Safety/Judgement: Decreased awareness of safety, Decreased awareness of deficits Awareness: Intellectual Problem Solving: Slow processing, Decreased initiation, Difficulty sequencing, Requires verbal cues, Requires tactile cues General Comments: perseverative on certain functional  tasks initially (pulling up and down bed covers). Responds well to one-step  commands, does lack safety awareness and awareness of deficits   Rancho Mirant Scales of Cognitive Functioning: Confused, Appropriate: Moderate Assistance [VI]    Exercises      General Comments        Pertinent  Vitals/Pain Pain Assessment Pain Assessment: 0-10 Pain Score: 7  Pain Location: head Pain Descriptors / Indicators: Headache Pain Intervention(s): Limited activity within patient's tolerance, Monitored during session    Home Living                          Prior Function            PT Goals (current goals can now be found in the care plan section) Acute Rehab PT Goals Patient Stated Goal: home PT Goal Formulation: With patient Time For Goal Achievement: 10/22/23 Potential to Achieve Goals: Good Progress towards PT goals: Progressing toward goals    Frequency    Min 1X/week      PT Plan      Co-evaluation              AM-PAC PT "6 Clicks" Mobility   Outcome Measure  Help needed turning from your back to your side while in a flat bed without using bedrails?: A Little Help needed moving from lying on your back to sitting on the side of a flat bed without using bedrails?: A Little Help needed moving to and from a bed to a chair (including a wheelchair)?: A Little Help needed standing up from a chair using your arms (e.g., wheelchair or bedside chair)?: A Little Help needed to walk in hospital room?: A Lot Help needed climbing 3-5 steps with a railing? : Total 6 Click Score: 15    End of Session Equipment Utilized During Treatment: Gait belt Activity Tolerance: Patient limited by fatigue Patient left: in chair;with family/visitor present;with call bell/phone within reach;with chair alarm set Nurse Communication: Mobility status PT Visit Diagnosis: Other abnormalities of gait and mobility (R26.89)     Time: 2536-6440 PT Time Calculation (min) (ACUTE ONLY): 14 min  Charges:    $Gait Training: 8-22 mins PT General Charges $$ ACUTE PT VISIT: 1 Visit                     Marye Round, PT DPT Acute Rehabilitation Services Secure Chat Preferred  Office [phone]    Charles Dorsey 10/15/2023, 1:12 PM

## 2023-10-15 NOTE — Progress Notes (Signed)
Occupational Therapy Treatment Patient Details Name: Charles Dorsey MRN: [id number] DOB: 15-Oct-1961 Today's Date: 10/15/2023   History of present illness 62 yo male presents to [facility] on 11/10 with fall from cart while staining a deck, +ETOH. Pt sustained traumatic R SAH, R SDH,  L temporal bone fx, L TMJ fx. Head CT 11/16 slightly increased size of hemorrhagic contusions in the R frontal and temporal lobes, and slightly increased L midline shift now 7mm; transfer to ICU 11/16 for hypertonic saline. PMH includes DMII, HLD, coronary stent 2021.   OT comments  Pt with functional decline and noting decrease in balance and cognition compared to prior session. Pt also complaining of headache (7/10) and dizziness throughout. Pt performing functional mobility with Min-Mod A for balance and LOB correction. Pt performing grooming at sink with Min A for balance and safety; Mod cues for sequencing. Continue to recommend dc to post-acute rehab for intensive therapy. Will continue to follow acutely as admitted to optimize occupational performance and facilitate safe dc to next venue.       If plan is discharge home, recommend the following:  Direct supervision/assist for medications management;Direct supervision/assist for financial management;Help with stairs or ramp for entrance;Assist for transportation;Assistance with cooking/housework;A little help with walking and/or transfers;A little help with bathing/dressing/bathroom   Equipment Recommendations  None recommended by OT    Recommendations for Other Services Rehab consult    Precautions / Restrictions Precautions Precautions: Fall Restrictions Weight Bearing Restrictions: No       Mobility Bed Mobility Overal bed mobility: Needs Assistance Bed Mobility: Supine to Sit     Supine to sit: Min assist     General bed mobility comments: assist for trunk elevation    Transfers Overall transfer level: Needs assistance Equipment used: 1 person  hand held assist Transfers: Sit to/from Stand Sit to Stand: Min assist           General transfer comment: assist for rise and steady, stand x2 from EOB and toilet     Balance Overall balance assessment: Needs assistance Sitting-balance support: Feet supported Sitting balance-Leahy Scale: Fair     Standing balance support: No upper extremity supported, During functional activity Standing balance-Leahy Scale: Poor Standing balance comment: Pt needs therapist assistance to maintain balance                           ADL either performed or assessed with clinical judgement   ADL Overall ADL's : Needs assistance/impaired     Grooming: Minimal assistance;Oral care;Wash/dry face;Standing Grooming Details (indicate cue type and reason): Min A for balance in standing and with dynamic movement.             Lower Body Dressing: Minimal assistance;Sit to/from stand Lower Body Dressing Details (indicate cue type and reason): Min A for donning underwear to maintain standing balance. Able to bring LEs up to EOB for threading with increased time. Donning socks simiularly. Toilet Transfer: Minimal assistance;Ambulation;Regular Teacher, adult education Details (indicate cue type and reason): Min A for gaining balance in standing.         Functional mobility during ADLs: Minimal assistance General ADL Comments: pt with change in balance, cognition, and activity tolerance. Reporting dizziness thorughout and poor awareness of balance deficits. Requiring physical assist throughout to maintain balance    Extremity/Trunk Assessment Upper Extremity Assessment Upper Extremity Assessment: LUE deficits/detail LUE Deficits / Details: Noting overshooting during grooming tasks LUE Coordination: decreased fine motor  Lower Extremity Assessment Lower Extremity Assessment: Generalized weakness        Vision   Vision Assessment?: Yes Eye Alignment: Within Functional Limits Ocular  Range of Motion: Within Functional Limits Alignment/Gaze Preference: Within Defined Limits Tracking/Visual Pursuits: Able to track stimulus in all quads without difficulty Saccades: Within functional limits Convergence: Within functional limits Visual Fields: No apparent deficits Additional Comments: Decreased smooth tracking especially to L.   Perception Perception Perception: Within Functional Limits   Praxis Praxis Praxis: WFL    Cognition Arousal: Alert Behavior During Therapy: Flat affect, Restless, Impulsive Overall Cognitive Status: Impaired/Different from baseline Area of Impairment: Attention, Problem solving, Memory, Following commands, Safety/judgement, Awareness               Rancho Levels of Cognitive Functioning Rancho Los Amigos Scales of Cognitive Functioning: Confused, Appropriate: Moderate Assistance   Current Attention Level: Sustained Memory: Decreased short-term memory Following Commands: Follows one step commands inconsistently Safety/Judgement: Decreased awareness of safety, Decreased awareness of deficits Awareness: Intellectual Problem Solving: Slow processing, Decreased initiation, Difficulty sequencing, Requires verbal cues, Requires tactile cues General Comments: perseverative on certain functional  tasks initially (pulling up and down bed covers). Responds well to one-step commands, does lack safety awareness and awareness of deficits Rancho Mirant Scales of Cognitive Functioning: Confused, Appropriate: Moderate Assistance [VI]      Exercises      Shoulder Instructions       General Comments VSS. wife present    Pertinent Vitals/ Pain       Pain Assessment Pain Assessment: 0-10 Pain Score: 7  Pain Location: head Pain Descriptors / Indicators: Headache Pain Intervention(s): Monitored during session, Limited activity within patient's tolerance, Repositioned  Home Living                                           Prior Functioning/Environment              Frequency  Min 1X/week        Progress Toward Goals  OT Goals(current goals can now be found in the care plan section)  Progress towards OT goals: Progressing toward goals (Status change)  Acute Rehab OT Goals OT Goal Formulation: With patient/family Time For Goal Achievement: 10/22/23 Potential to Achieve Goals: Good ADL Goals Pt Will Perform Grooming: with supervision;standing (two tasks at the sink) Pt Will Perform Lower Body Bathing: with supervision;sit to/from stand Pt Will Perform Lower Body Dressing: with supervision;sit to/from stand Pt Will Transfer to Toilet: with supervision;bedside commode;stand pivot transfer Pt Will Perform Toileting - Clothing Manipulation and hygiene: with supervision;sit to/from stand Pt Will Perform Tub/Shower Transfer: Tub transfer;with supervision;tub bench;ambulating;rolling walker  Plan      Co-evaluation                 AM-PAC OT "6 Clicks" Daily Activity     Outcome Measure   Help from another person eating meals?: A Little Help from another person taking care of personal grooming?: A Little Help from another person toileting, which includes using toliet, bedpan, or urinal?: A Little Help from another person bathing (including washing, rinsing, drying)?: A Little Help from another person to put on and taking off regular upper body clothing?: A Little Help from another person to put on and taking off regular lower body clothing?: A Lot 6 Click Score: 17    End of Session Equipment Utilized  During Treatment: Gait belt  OT Visit Diagnosis: Unsteadiness on feet (R26.81);Other abnormalities of gait and mobility (R26.89);Muscle weakness (generalized) (M62.81);Pain;Other symptoms and signs involving cognitive function Pain - Right/Left:  (headache)   Activity Tolerance Patient limited by fatigue;Patient tolerated treatment well   Patient Left with call bell/phone within reach;in  chair;with chair alarm set;with nursing/sitter in room   Nurse Communication Mobility status        Time: 0102-7253 OT Time Calculation (min): 22 min  Charges: OT General Charges $OT Visit: 1 Visit OT Treatments $Self Care/Home Management : 8-22 mins  Zakk Borgen MSOT, OTR/L Acute Rehab Office: [phone]  Theodoro Grist Jeanann Balinski 10/15/2023, 1:42 PM

## 2023-10-15 NOTE — Progress Notes (Signed)
Patient ID: Charles Dorsey, male   DOB: Sep 29, 1961, 62 y.o.   MRN: [id number] BP (!) 142/58   Pulse 80   Temp 99.9 F (37.7 C) (Oral)   Resp (!) 21   Ht 5\' 8"  (1.727 m)   Wt 59.7 kg   SpO2 100%   BMI 20.01 kg/m  Alert, moving all extremities Sodium normalizing,134 at 1201 He is improving

## 2023-10-15 NOTE — Progress Notes (Signed)
Inpatient Rehab Admissions Coordinator:  ° °Per therapy recommendation,  patient was screened for CIR candidacy by Ltanya Bayley, MS, CCC-SLP. At this time, Pt. Appears to be a a potential candidate for CIR. I will place   order for rehab consult per protocol for full assessment. Please contact me any with questions. ° °Jacy Brocker, MS, CCC-SLP °Rehab Admissions Coordinator  °[phone] (celll) °[phone] (office) ° °

## 2023-10-15 NOTE — Progress Notes (Signed)
Trauma/Critical Care Follow Up Note  Subjective:    Overnight Issues:   Objective:  Vital signs for last 24 hours: Temp:  [98.6 F (37 C)-100.2 F (37.9 C)] 99.7 F (37.6 C) (11/18 0800) Pulse Rate:  [61-87] 71 (11/18 0800) Resp:  [7-22] 14 (11/18 0800) BP: (118-162)/(51-127) 157/65 (11/18 0800) SpO2:  [96 %-100 %] 100 % (11/18 0800) Weight:  [59.7 kg] 59.7 kg (11/18 0500)  Hemodynamic parameters for last 24 hours:    Intake/Output from previous day: 11/17 0701 - 11/18 0700 In: 612.2 [I.V.:412.2; IV Piggyback:200] Out: 1505 [Urine:1505]  Intake/Output this shift: Total I/O In: 15 [I.V.:15] Out: -   Vent settings for last 24 hours:    Physical Exam:  Gen: comfortable, no distress Neuro: follows commands, alert, communicative HEENT: PERRL Neck: supple CV: RRR and hypertensive Pulm: unlabored breathing on RA Abd: soft, NT    GU: urine clear and yellow, +spontaneous voids Extr: wwp, no edema  Results for orders placed or performed during the hospital encounter of 10/07/23 (from the past 24 hour(s))  Sodium     Status: Abnormal   Collection Time: 10/14/23 11:17 AM  Result Value Ref Range   Sodium 132 (L) 135 - 145 mmol/L  Magnesium     Status: None   Collection Time: 10/14/23 11:17 AM  Result Value Ref Range   Magnesium 2.1 1.7 - 2.4 mg/dL  Phosphorus     Status: Abnormal   Collection Time: 10/14/23 11:17 AM  Result Value Ref Range   Phosphorus 2.2 (L) 2.5 - 4.6 mg/dL  Glucose, capillary     Status: Abnormal   Collection Time: 10/14/23 11:47 AM  Result Value Ref Range   Glucose-Capillary 264 (H) 70 - 99 mg/dL  Sodium     Status: Abnormal   Collection Time: 10/14/23  3:10 PM  Result Value Ref Range   Sodium 134 (L) 135 - 145 mmol/L  Glucose, capillary     Status: Abnormal   Collection Time: 10/14/23  3:56 PM  Result Value Ref Range   Glucose-Capillary 144 (H) 70 - 99 mg/dL  Sodium     Status: Abnormal   Collection Time: 10/14/23  7:26 PM  Result  Value Ref Range   Sodium 134 (L) 135 - 145 mmol/L  Glucose, capillary     Status: Abnormal   Collection Time: 10/14/23  7:43 PM  Result Value Ref Range   Glucose-Capillary 175 (H) 70 - 99 mg/dL  Glucose, capillary     Status: Abnormal   Collection Time: 10/14/23 11:33 PM  Result Value Ref Range   Glucose-Capillary 105 (H) 70 - 99 mg/dL  Sodium     Status: None   Collection Time: 10/14/23 11:46 PM  Result Value Ref Range   Sodium 137 135 - 145 mmol/L  Sodium     Status: None   Collection Time: 10/15/23  2:34 AM  Result Value Ref Range   Sodium 136 135 - 145 mmol/L  Glucose, capillary     Status: None   Collection Time: 10/15/23  3:28 AM  Result Value Ref Range   Glucose-Capillary 93 70 - 99 mg/dL  Glucose, capillary     Status: Abnormal   Collection Time: 10/15/23  7:20 AM  Result Value Ref Range   Glucose-Capillary 112 (H) 70 - 99 mg/dL  Basic metabolic panel     Status: Abnormal   Collection Time: 10/15/23  7:31 AM  Result Value Ref Range   Sodium 135 135 - 145 mmol/L  Potassium 3.1 (L) 3.5 - 5.1 mmol/L   Chloride 106 98 - 111 mmol/L   CO2 23 22 - 32 mmol/L   Glucose, Bld 111 (H) 70 - 99 mg/dL   BUN 11 6 - 20 mg/dL   Creatinine, Ser 4.09 0.61 - 1.24 mg/dL   Calcium 8.1 (L) 8.9 - 10.3 mg/dL   GFR, Estimated >81 >19 mL/min   Anion gap 6 5 - 15  Phosphorus     Status: None   Collection Time: 10/15/23  7:31 AM  Result Value Ref Range   Phosphorus 3.0 2.5 - 4.6 mg/dL    Assessment & Plan: The plan of care was discussed with the bedside nurse for the day, Amy, who is in agreement with this plan and no additional concerns were raised.   Present on Admission:  SDH (subdural hematoma) (HCC)    LOS: 8 days   Additional comments:I reviewed the patient's new clinical lab test results.   and I reviewed the patients new imaging test results.    Fall from a cart   TBI/SDH/SAH - NSGY c/s, Dr. Franky Macho, TBI team therapies. MS decline over last 24-48 hours. Last CT 11/11  overall stable. Concerning neuro exam this am with weakness in prox LUE/LLE.  Drowsy yest AM, refractory to flumazenil. Repeat head CT with slight increased IPH and MLS to 7mm. D/c seroquel  Agitation - resolved Left temporal bone fracture, L TMJ FX -Dr. Elijah Birk recs for Ciprodex drops,  ETOH use daily - CIWA DM - SSI HTN - home meds reordered. Persistently hypertensive, will d/w NSGY what goal BP is Hyponatremia - txf to ICU 11/16 for HTS, Na 135 this AM, resume PO salt tabs 11/16, likely can d/c 3% today--will d/w Dr. Franky Macho FEN - diet, salt tabs and HTS hyponatremia, remains with poor PO intake, encourage at least 2 Boost/Ensure per day, escalate bowel regimen DVT - SCDs, LMWH Dispo - ICU, I spoke with his wife at bedside  Critical Care Total Time: 35 minutes  Diamantina Monks, MD Trauma & General Surgery Please use [email or web address] to contact on call provider  10/15/2023  *Care during the described time interval was provided by me. I have reviewed this patient's available data, including medical history, events of note, physical examination and test results as part of my evaluation.

## 2023-10-16 LAB — BASIC METABOLIC PANEL
Anion gap: 7 (ref 5–15)
BUN: 8 mg/dL (ref 6–20)
CO2: 25 mmol/L (ref 22–32)
Calcium: 8.6 mg/dL — ABNORMAL LOW (ref 8.9–10.3)
Chloride: 102 mmol/L (ref 98–111)
Creatinine, Ser: 0.82 mg/dL (ref 0.61–1.24)
GFR, Estimated: >60 mL/min (ref >60)
Glucose, Bld: 138 mg/dL — ABNORMAL HIGH (ref 70–99)
Potassium: 3.8 mmol/L (ref 3.5–5.1)
Sodium: 134 mmol/L — ABNORMAL LOW (ref 135–145)

## 2023-10-16 LAB — GLUCOSE, CAPILLARY
Glucose-Capillary: 130 mg/dL — ABNORMAL HIGH (ref 70–99)
Glucose-Capillary: 136 mg/dL — ABNORMAL HIGH (ref 70–99)
Glucose-Capillary: 157 mg/dL — ABNORMAL HIGH (ref 70–99)
Glucose-Capillary: 139 mg/dL — ABNORMAL HIGH (ref 70–99)
Glucose-Capillary: 226 mg/dL — ABNORMAL HIGH (ref 70–99)
Glucose-Capillary: 175 mg/dL — ABNORMAL HIGH (ref 70–99)

## 2023-10-16 MED ORDER — DIPHENHYDRAMINE HCL 25 MG PO CAPS
25.0000 mg | ORAL_CAPSULE | Freq: Four times a day (QID) | ORAL | Status: DC | PRN
  Administered 2023-10-16 – 2023-10-17 (×2): 25 mg via ORAL
  Filled 2023-10-16 (×2): qty 1

## 2023-10-16 MED ORDER — LEVETIRACETAM 500 MG PO TABS
500.0000 mg | ORAL_TABLET | Freq: Two times a day (BID) | ORAL | Status: DC
  Administered 2023-10-16 – 2023-10-19 (×7): 500 mg via ORAL
  Filled 2023-10-16 (×7): qty 1

## 2023-10-16 MED ORDER — SODIUM CHLORIDE 1 G PO TABS
2.0000 g | ORAL_TABLET | Freq: Three times a day (TID) | ORAL | Status: DC
  Administered 2023-10-16 – 2023-10-17 (×4): 2 g via ORAL
  Filled 2023-10-16 (×4): qty 2

## 2023-10-16 NOTE — Progress Notes (Signed)
Physical Therapy Treatment Patient Details Name: Charles Dorsey MRN: [id number] DOB: 1960-12-31 Today's Date: 10/16/2023   History of Present Illness 62 yo male presents to [facility] on 11/10 with fall from cart while staining a deck, +ETOH. Pt sustained traumatic R SAH, R SDH,  L temporal bone fx, L TMJ fx. Head CT 11/16 slightly increased size of hemorrhagic contusions in the R frontal and temporal lobes, and slightly increased L midline shift now 7mm; transfer to ICU 11/16 for hypertonic saline. PMH includes DMII, HLD, coronary stent 2021.    PT Comments  Patient resting in bed at start and moderate encouragement required to mobilize today. Pt c/o headache 8/10 and premedicated. Pt required min assist and cues for supine>sit to direct bed mobility to correct side with bed rail down. Pt attempting to slide lower body out of bed and Min assist with Mod cues to raise trunk upright and obtain seated balance at EOB required. Min assist with [facility] for sit<>stand and gait initially fading to 1HHA on Lt. Slight LOB noted with turns and pt with narrow BOS throughout requiring cues to widen step width. EOS pt requesting return to bed for rest. Will continue to progress pt as able, recommend intense rehab follow up at inpatient setting >3 hours/day.    If plan is discharge home, recommend the following: A little help with walking and/or transfers;A little help with bathing/dressing/bathroom   Can travel by private vehicle        Equipment Recommendations  None recommended by PT (TBD at next venue)    Recommendations for Other Services Rehab consult     Precautions / Restrictions Precautions Precautions: Fall Restrictions Weight Bearing Restrictions: No     Mobility  Bed Mobility Overal bed mobility: Needs Assistance Bed Mobility: Supine to Sit, Sit to Supine     Supine to sit: Min assist Sit to supine: Contact guard assist   General bed mobility comments: assist for trunk elevation and to guide  direction of bed mobility supine>sit    Transfers Overall transfer level: Needs assistance Equipment used: 1 person hand held assist Transfers: Sit to/from Stand Sit to Stand: Min assist           General transfer comment: assist for rise and steady, HHA to stabilize balance from EOB and recliner    Ambulation/Gait Ambulation/Gait assistance: Min assist Gait Distance (Feet): 75 Feet Assistive device: 1 person hand held assist, 2 person hand held assist Gait Pattern/deviations: Step-through pattern, Decreased stride length, Scissoring, Ataxic, Drifts right/left, Narrow base of support Gait velocity: decr     General Gait Details: 2HHA then 1 HHA for gait in room and hallway. pt drifting Rt/Lt with narrow BOS and min assist to stabilize balacne with turns. pt perseverating on headache and required cues to attend to tasks.   Stairs             Wheelchair Mobility     Tilt Bed    Modified Rankin (Stroke Patients Only)       Balance Overall balance assessment: Needs assistance Sitting-balance support: Feet supported Sitting balance-Leahy Scale: Fair     Standing balance support: Single extremity supported, Bilateral upper extremity supported, During functional activity Standing balance-Leahy Scale: Poor Standing balance comment: reliant on external assist                            Cognition Arousal: Alert Behavior During Therapy: Flat affect, Restless, Impulsive Overall Cognitive Status: Impaired/Different  from baseline Area of Impairment: Attention, Following commands, Awareness, Problem solving, Safety/judgement               Rancho Levels of Cognitive Functioning Rancho Los Amigos Scales of Cognitive Functioning: Confused, Appropriate: Moderate Assistance   Current Attention Level: Selective Memory: Decreased short-term memory Following Commands: Follows one step commands inconsistently Safety/Judgement: Decreased awareness of  safety, Decreased awareness of deficits Awareness: Intellectual Problem Solving: Slow processing, Decreased initiation, Difficulty sequencing, Requires verbal cues, Requires tactile cues     Rancho [address] Scales of Cognitive Functioning: Confused, Appropriate: Moderate Assistance [VI]    Exercises Other Exercises Other Exercises: repeated sit<>stand with CGA, 5 reps    General Comments        Pertinent Vitals/Pain Pain Assessment Pain Assessment: 0-10 Pain Score: 8  Pain Location: head Pain Descriptors / Indicators: Headache Pain Intervention(s): Limited activity within patient's tolerance, Monitored during session, Premedicated before session, Repositioned    Home Living                          Prior Function            PT Goals (current goals can now be found in the care plan section) Acute Rehab PT Goals Patient Stated Goal: home PT Goal Formulation: With patient Time For Goal Achievement: 10/22/23 Potential to Achieve Goals: Good Progress towards PT goals: Progressing toward goals    Frequency    Min 1X/week      PT Plan      Co-evaluation              AM-PAC PT "6 Clicks" Mobility   Outcome Measure  Help needed turning from your back to your side while in a flat bed without using bedrails?: A Little Help needed moving from lying on your back to sitting on the side of a flat bed without using bedrails?: A Little Help needed moving to and from a bed to a chair (including a wheelchair)?: A Little Help needed standing up from a chair using your arms (e.g., wheelchair or bedside chair)?: A Little Help needed to walk in hospital room?: A Little Help needed climbing 3-5 steps with a railing? : Total 6 Click Score: 16    End of Session Equipment Utilized During Treatment: Gait belt Activity Tolerance: Patient limited by pain;Patient limited by fatigue (limited by headache and fatigue) Patient left: in bed;with call bell/phone within  reach;with bed alarm set;with family/visitor present Nurse Communication: Mobility status PT Visit Diagnosis: Other abnormalities of gait and mobility (R26.89)     Time: 2952-8413 PT Time Calculation (min) (ACUTE ONLY): 27 min  Charges:    $Gait Training: 8-22 mins $Therapeutic Activity: 8-22 mins PT General Charges $$ ACUTE PT VISIT: 1 Visit                     Wynn Maudlin, DPT Acute Rehabilitation Services Office [phone]  10/16/23 1:20 PM

## 2023-10-16 NOTE — Progress Notes (Signed)
Trauma/Critical Care Follow Up Note  Subjective:    Overnight Issues:   Objective:  Vital signs for last 24 hours: Temp:  [98.7 F (37.1 C)-99.9 F (37.7 C)] 98.7 F (37.1 C) (11/19 0400) Pulse Rate:  [63-104] 72 (11/19 0700) Resp:  [12-22] 20 (11/19 0700) BP: (113-174)/(55-86) 140/71 (11/19 0700) SpO2:  [100 %] 100 % (11/19 0700) Weight:  [59.6 kg] 59.6 kg (11/19 0500)  Hemodynamic parameters for last 24 hours:    Intake/Output from previous day: 11/18 0701 - 11/19 0700 In: 901.9 [P.O.:600; I.V.:100.9; IV Piggyback:201] Out: -   Intake/Output this shift: No intake/output data recorded.  Vent settings for last 24 hours:    Physical Exam:  Gen: comfortable, no distress Neuro: follows commands, alert, communicative HEENT: PERRL Neck: supple CV: RRR Pulm: unlabored breathing on RA Abd: soft, NT    GU: urine clear and yellow, +spontaneous voids Extr: wwp, no edema  Results for orders placed or performed during the hospital encounter of 10/07/23 (from the past 24 hour(s))  Glucose, capillary     Status: Abnormal   Collection Time: 10/15/23 11:40 AM  Result Value Ref Range   Glucose-Capillary 226 (H) 70 - 99 mg/dL  Sodium     Status: Abnormal   Collection Time: 10/15/23 12:01 PM  Result Value Ref Range   Sodium 134 (L) 135 - 145 mmol/L  Glucose, capillary     Status: Abnormal   Collection Time: 10/15/23  3:26 PM  Result Value Ref Range   Glucose-Capillary 188 (H) 70 - 99 mg/dL  Glucose, capillary     Status: Abnormal   Collection Time: 10/15/23  7:30 PM  Result Value Ref Range   Glucose-Capillary 190 (H) 70 - 99 mg/dL  Glucose, capillary     Status: Abnormal   Collection Time: 10/15/23 11:27 PM  Result Value Ref Range   Glucose-Capillary 147 (H) 70 - 99 mg/dL  Glucose, capillary     Status: Abnormal   Collection Time: 10/16/23  3:20 AM  Result Value Ref Range   Glucose-Capillary 130 (H) 70 - 99 mg/dL  Glucose, capillary     Status: Abnormal    Collection Time: 10/16/23  7:29 AM  Result Value Ref Range   Glucose-Capillary 136 (H) 70 - 99 mg/dL    Assessment & Plan:  Present on Admission:  SDH (subdural hematoma) (HCC)    LOS: 9 days   Additional comments:I reviewed the patient's new clinical lab test results.   and I reviewed the patients new imaging test results.    Fall from a cart   TBI/SDH/SAH - NSGY c/s, Dr. Franky Macho, TBI team therapies. MS decline over last 24-48 hours. Last CT 11/11 overall stable. Concerning neuro exam this am with weakness in prox LUE/LLE.  Drowsy yest AM, refractory to flumazenil. Repeat head CT with slight increased IPH and MLS to 7mm. D/c seroquel  Agitation - resolved Left temporal bone fracture, L TMJ FX -Dr. Elijah Birk recs for Ciprodex drops,  ETOH use daily - CIWA DM - SSI HTN - home meds reordered. Persistently hypertensive, will d/w NSGY what goal BP is Hyponatremia - txf to ICU 11/16 for HTS, Na 134 this AM, resume PO salt tabs 11/16, incr to 2 TID today FEN - diet, incr salt tabs for hyponatremia, remains with poor PO intake, encourage at least 2 Boost/Ensure per day, escalate bowel regimen DVT - SCDs, LMWH Dispo - 4NP  Diamantina Monks, MD Trauma & General Surgery Please use [email or web address] to contact on  call provider  10/16/2023  *Care during the described time interval was provided by me. I have reviewed this patient's available data, including medical history, events of note, physical examination and test results as part of my evaluation.

## 2023-10-16 NOTE — Progress Notes (Signed)
Inpatient Rehab Admissions Coordinator:    Pt. With decline in function and medical status change since CIR signed off previously. He is now appropriate for CIR. Family wants to pursue CIR admit, daughter will [facility] to assist Pt. At d/c. I will send case to insurance and pursue admit.  Megan Salon, MS, CCC-SLP Rehab Admissions Coordinator  [phone] (celll) [phone] (office)

## 2023-10-16 NOTE — Progress Notes (Signed)
Patient ID: Charles Dorsey, male   DOB: [birth year], 61 y.o.   MRN: [id number] BP (!) 140/66 (BP Location: Left Arm)   Pulse 67   Temp 99.1 F (37.3 C) (Oral)   Resp 13   Ht 5\' 8"  (1.727 m)   Wt 59.6 kg   SpO2 96%   BMI 19.98 kg/m  Currently alert, following all commands Speech is clear, fluent Moving all extremities Complains of persistent headaches Perrl, full eom Tongue and uvula midline improved

## 2023-10-17 LAB — SODIUM, URINE, RANDOM: Sodium, Ur: 82 mmol/L

## 2023-10-17 LAB — GLUCOSE, CAPILLARY
Glucose-Capillary: 179 mg/dL — ABNORMAL HIGH (ref 70–99)
Glucose-Capillary: 142 mg/dL — ABNORMAL HIGH (ref 70–99)
Glucose-Capillary: 186 mg/dL — ABNORMAL HIGH (ref 70–99)
Glucose-Capillary: 267 mg/dL — ABNORMAL HIGH (ref 70–99)
Glucose-Capillary: 207 mg/dL — ABNORMAL HIGH (ref 70–99)
Glucose-Capillary: 164 mg/dL — ABNORMAL HIGH (ref 70–99)
Glucose-Capillary: 140 mg/dL — ABNORMAL HIGH (ref 70–99)

## 2023-10-17 LAB — BASIC METABOLIC PANEL
Anion gap: 7 (ref 5–15)
BUN: 8 mg/dL (ref 6–20)
CO2: 29 mmol/L (ref 22–32)
Calcium: 9.5 mg/dL (ref 8.9–10.3)
Chloride: 96 mmol/L — ABNORMAL LOW (ref 98–111)
Creatinine, Ser: 0.69 mg/dL (ref 0.61–1.24)
GFR, Estimated: >60 mL/min (ref >60)
Glucose, Bld: 173 mg/dL — ABNORMAL HIGH (ref 70–99)
Potassium: 4.6 mmol/L (ref 3.5–5.1)
Sodium: 132 mmol/L — ABNORMAL LOW (ref 135–145)

## 2023-10-17 LAB — OSMOLALITY: Osmolality: 288 mosm/kg (ref 275–295)

## 2023-10-17 LAB — OSMOLALITY, URINE: Osmolality, Ur: 578 mosm/kg (ref 300–900)

## 2023-10-17 MED ORDER — UREA 15 G PO PACK
15.0000 g | PACK | Freq: Two times a day (BID) | ORAL | Status: DC
  Administered 2023-10-17 – 2023-10-19 (×5): 15 g via ORAL
  Filled 2023-10-17 (×6): qty 1

## 2023-10-17 MED ORDER — SODIUM CHLORIDE 1 G PO TABS
3.0000 g | ORAL_TABLET | Freq: Three times a day (TID) | ORAL | Status: DC
  Administered 2023-10-17 – 2023-10-19 (×6): 3 g via ORAL
  Filled 2023-10-17 (×6): qty 3

## 2023-10-17 NOTE — Progress Notes (Signed)
Patient ID: Charles Dorsey, male   DOB: 05-17-1961, 62 y.o.   MRN: [id number] BP 125/69 (BP Location: Right Arm)   Pulse 71   Temp 98.2 F (36.8 C) (Oral)   Resp 12   Ht 5\' 8"  (1.727 m)   Wt 59.6 kg   SpO2 95%   BMI 19.98 kg/m  Alert, oriented will follow commands Perrl Spoke with Dr. Bedelia Person about sodium. Ok to increase, would hold in progressive until sodium is stable.

## 2023-10-17 NOTE — Progress Notes (Signed)
Inpatient Rehab Admissions Coordinator:    I received insurance authorization and have a CIR bed for this Pt. Today. RN may call report to [phone].   Pt. Will admit to CIR for an estimated 7-10 days of intensive rehab with the goal of reaching supervision level and discharging home with assistance of his wife and daughter.  Megan Salon, MS, CCC-SLP Rehab Admissions Coordinator  [phone] (celll) [phone] (office)

## 2023-10-17 NOTE — Progress Notes (Signed)
Occupational Therapy Treatment Patient Details Name: Charles Dorsey MRN: [id number] DOB: 1961/04/28 Today's Date: 10/17/2023   History of present illness 62 yo male presents to [facility] on 11/10 with fall from cart while staining a deck, +ETOH. Pt sustained traumatic R SAH, R SDH,  L temporal bone fx, L TMJ fx. Head CT 11/16 slightly increased size of hemorrhagic contusions in the R frontal and temporal lobes, and slightly increased L midline shift now 7mm; transfer to ICU 11/16 for hypertonic saline. PMH includes DMII, HLD, coronary stent 2021.   OT comments  Patient received in supine and required min assist to get to EOB. Patient returned to supine without assistance and encouraged to get OOB for breakfast. Patient declined going to bathroom and transferred to recliner with min assist and HHA. Patient performed grooming, UB bathing, and LB dressing while seated in recliner. Patient will benefit from intensive inpatient follow up therapy, >3 hours/day to continue to increase independence and safety with self care and functional transfers. Acute OT to continue to follow.       If plan is discharge home, recommend the following:  Direct supervision/assist for medications management;Direct supervision/assist for financial management;Help with stairs or ramp for entrance;Assist for transportation;Assistance with cooking/housework;A little help with walking and/or transfers;A little help with bathing/dressing/bathroom   Equipment Recommendations  None recommended by OT    Recommendations for Other Services      Precautions / Restrictions Precautions Precautions: Fall Restrictions Weight Bearing Restrictions: No       Mobility Bed Mobility Overal bed mobility: Needs Assistance Bed Mobility: Supine to Sit     Supine to sit: Min assist Sit to supine: Supervision   General bed mobility comments: min assist to get to EOB, patient wnety back to supine after sitting up and required min assist to  sit up again    Transfers Overall transfer level: Needs assistance Equipment used: 1 person hand held assist Transfers: Sit to/from Stand Sit to Stand: Min assist     Step pivot transfers: Min assist     General transfer comment: min assist to stand and for balance while ambulating around bed to recliner     Balance Overall balance assessment: Needs assistance Sitting-balance support: Feet supported Sitting balance-Leahy Scale: Fair     Standing balance support: Single extremity supported, Bilateral upper extremity supported, During functional activity Standing balance-Leahy Scale: Poor Standing balance comment: reliant on external assist                           ADL either performed or assessed with clinical judgement   ADL Overall ADL's : Needs assistance/impaired     Grooming: Wash/dry hands;Wash/dry face;Oral care;Supervision/safety;Sitting Grooming Details (indicate cue type and reason): declined going to bathroom for self care and standing at sink Upper Body Bathing: Set up;Sitting           Lower Body Dressing: Minimal assistance;Sitting/lateral leans Lower Body Dressing Details (indicate cue type and reason): min assist for changing socks Toilet Transfer: Minimal assistance;Ambulation Toilet Transfer Details (indicate cue type and reason): simulated to recliner                Extremity/Trunk Assessment              Vision       Perception     Praxis      Cognition Arousal: Alert Behavior During Therapy: Flat affect, Restless, Impulsive Overall Cognitive Status: Impaired/Different from baseline Area of  Impairment: Attention, Following commands, Awareness, Problem solving, Safety/judgement               Rancho Levels of Cognitive Functioning Rancho Los Amigos Scales of Cognitive Functioning: Confused, Appropriate: Moderate Assistance   Current Attention Level: Selective Memory: Decreased short-term memory Following  Commands: Follows one step commands with increased time Safety/Judgement: Decreased awareness of safety, Decreased awareness of deficits Awareness: Intellectual Problem Solving: Slow processing, Decreased initiation, Difficulty sequencing, Requires verbal cues, Requires tactile cues General Comments: required encouragement to participate, wanted to stay in bed [facility] Scales of Cognitive Functioning: Confused, Appropriate: Moderate Assistance [VI]      Exercises      Shoulder Instructions       General Comments BP seated in recliner at end of session 168/73 (97)    Pertinent Vitals/ Pain       Pain Assessment Pain Assessment: Faces Faces Pain Scale: Hurts a little bit Pain Location: head Pain Descriptors / Indicators: Headache Pain Intervention(s): Monitored during session  Home Living                                          Prior Functioning/Environment              Frequency  Min 1X/week        Progress Toward Goals  OT Goals(current goals can now be found in the care plan section)  Progress towards OT goals: Progressing toward goals  Acute Rehab OT Goals Patient Stated Goal: more therapy OT Goal Formulation: With family Time For Goal Achievement: 10/22/23 Potential to Achieve Goals: Good ADL Goals Pt Will Perform Grooming:  (two tasks at the sink) Pt Will Perform Lower Body Bathing: with supervision;sit to/from stand Pt Will Perform Lower Body Dressing: with supervision;sit to/from stand Pt Will Transfer to Toilet: with supervision;bedside commode;stand pivot transfer Pt Will Perform Toileting - Clothing Manipulation and hygiene: with supervision;sit to/from stand Pt Will Perform Tub/Shower Transfer: Tub transfer;with supervision;tub bench;ambulating;rolling walker  Plan      Co-evaluation                 AM-PAC OT "6 Clicks" Daily Activity     Outcome Measure   Help from another person eating meals?: A  Little Help from another person taking care of personal grooming?: A Little Help from another person toileting, which includes using toliet, bedpan, or urinal?: A Little Help from another person bathing (including washing, rinsing, drying)?: A Little Help from another person to put on and taking off regular upper body clothing?: A Little Help from another person to put on and taking off regular lower body clothing?: A Lot 6 Click Score: 17    End of Session Equipment Utilized During Treatment: Gait belt  OT Visit Diagnosis: Unsteadiness on feet (R26.81);Other abnormalities of gait and mobility (R26.89);Muscle weakness (generalized) (M62.81);Pain;Other symptoms and signs involving cognitive function   Activity Tolerance Patient tolerated treatment well;Patient limited by fatigue   Patient Left in chair;with call bell/phone within reach;with chair alarm set;with family/visitor present   Nurse Communication Mobility status        Time: 3086-5784 OT Time Calculation (min): 21 min  Charges: OT General Charges $OT Visit: 1 Visit OT Treatments $Self Care/Home Management : 8-22 mins  Alfonse Flavors, OTA Acute Rehabilitation Services  Office [phone]   Dewain Penning 10/17/2023, 12:29 PM

## 2023-10-17 NOTE — Progress Notes (Signed)
Progress Note     Subjective: Patient reports ongoing headache. Tolerating diet - ate some eggs, slice of bacon and a few bites of grits this AM. Had a BM yesterday. Wife at bedside.   Objective: Vital signs in last 24 hours: Temp:  [98.3 F (36.8 C)-100 F (37.8 C)] 98.6 F (37 C) (11/20 0826) Pulse Rate:  [58-78] 65 (11/20 0826) Resp:  [12-20] 15 (11/20 0826) BP: (140-173)/(66-93) 168/73 (11/20 0826) SpO2:  [95 %-100 %] 95 % (11/20 0826) Weight:  [59.6 kg] 59.6 kg (11/20 0500) Last BM Date : 10/16/23  Intake/Output from previous day: No intake/output data recorded. Intake/Output this shift: Total I/O In: 240 [P.O.:240] Out: -   PE: General: pleasant, WD, thin male who is laying in bed in NAD HEENT: EOMI Heart: regular, rate, and rhythm.   Lungs: CTAB, no wheezes, rhonchi, or rales noted.  Respiratory effort nonlabored Abd: soft, NT, ND Skin: warm and dry with no masses, lesions, or rashes Neuro: speech clear, communicative and appropriate, following commands. MAEs Psych: A&Ox4 with an appropriate affect.    Lab Results:  No results for input(s): "WBC", "HGB", "HCT", "PLT" in the last 72 hours. BMET Recent Labs    10/15/23 0731 10/15/23 1201 10/16/23 0732  NA 135 134* 134*  K 3.1*  --  3.8  CL 106  --  102  CO2 23  --  25  GLUCOSE 111*  --  138*  BUN 11  --  8  CREATININE 0.80  --  0.82  CALCIUM 8.1*  --  8.6*   PT/INR No results for input(s): "LABPROT", "INR" in the last 72 hours. CMP     Component Value Date/Time   NA 134 (L) 10/16/2023 0732   K 3.8 10/16/2023 0732   CL 102 10/16/2023 0732   CO2 25 10/16/2023 0732   GLUCOSE 138 (H) 10/16/2023 0732   BUN 8 10/16/2023 0732   CREATININE 0.82 10/16/2023 0732   CALCIUM 8.6 (L) 10/16/2023 0732   PROT 6.8 10/07/2023 1315   ALBUMIN 3.8 10/07/2023 1315   AST 78 (H) 10/07/2023 1315   ALT 56 (H) 10/07/2023 1315   ALKPHOS 134 (H) 10/07/2023 1315   BILITOT 1.0 10/07/2023 1315   GFRNONAA >60  10/16/2023 0732   GFRAA >60 03/19/2020 0355   Lipase     Component Value Date/Time   LIPASE 153 (H) 12/21/2020 7425       Studies/Results: No results found.  Anti-infectives: Anti-infectives (From admission, onward)    None        Assessment/Plan  Fall from a cart   TBI/SDH/SAH - NSGY c/s, Dr. Franky Macho, TBI team therapies. MS decline over last 24-48 hours. Last CT 11/11 overall stable. Concerning neuro exam 11/15 with weakness in prox LUE/LLE. Repeat head CT with slight increased IPH and MLS to 7mm.  Agitation - resolved Left temporal bone fracture, L TMJ FX -Dr. Elijah Birk recs for Ciprodex drops,  ETOH use daily - CIWA DM - SSI HTN - home meds reordered. Persistently hypertensive, will d/w NSGY what goal BP is Hyponatremia - on 2G TID salt tabs, repeat BMET in AM  FEN - diet, salt tabs, remains with poor PO intake, encourage at least 2 Boost/Ensure per day DVT - SCDs, LMWH ID - no current abx, afeb Dispo - 4NP, continue therapies. Insurance auth pending for [employer].  LOS: 10 days   I reviewed nursing notes, Consultant NS notes, last 24 h vitals and pain scores, last 48 h intake and  output, and last 24 h labs and trends.   Juliet Rude, [facility] Surgery 10/17/2023, 10:04 AM Please see Amion for pager number during day hours 7:00am-4:30pm

## 2023-10-18 LAB — GLUCOSE, CAPILLARY
Glucose-Capillary: 159 mg/dL — ABNORMAL HIGH (ref 70–99)
Glucose-Capillary: 181 mg/dL — ABNORMAL HIGH (ref 70–99)
Glucose-Capillary: 240 mg/dL — ABNORMAL HIGH (ref 70–99)
Glucose-Capillary: 126 mg/dL — ABNORMAL HIGH (ref 70–99)
Glucose-Capillary: 203 mg/dL — ABNORMAL HIGH (ref 70–99)
Glucose-Capillary: 104 mg/dL — ABNORMAL HIGH (ref 70–99)

## 2023-10-18 LAB — BASIC METABOLIC PANEL
Anion gap: 8 (ref 5–15)
BUN: 16 mg/dL (ref 6–20)
CO2: 24 mmol/L (ref 22–32)
Calcium: 8.9 mg/dL (ref 8.9–10.3)
Chloride: 96 mmol/L — ABNORMAL LOW (ref 98–111)
Creatinine, Ser: 0.53 mg/dL — ABNORMAL LOW (ref 0.61–1.24)
GFR, Estimated: >60 mL/min (ref >60)
Glucose, Bld: 185 mg/dL — ABNORMAL HIGH (ref 70–99)
Potassium: 4 mmol/L (ref 3.5–5.1)
Sodium: 128 mmol/L — ABNORMAL LOW (ref 135–145)

## 2023-10-18 MED ORDER — ACETAMINOPHEN 325 MG PO TABS
650.0000 mg | ORAL_TABLET | Freq: Four times a day (QID) | ORAL | Status: DC
  Administered 2023-10-18 – 2023-10-19 (×5): 650 mg via ORAL
  Filled 2023-10-18 (×5): qty 2

## 2023-10-18 MED ORDER — FUROSEMIDE 10 MG/ML IJ SOLN
40.0000 mg | Freq: Once | INTRAMUSCULAR | Status: AC
  Administered 2023-10-18: 40 mg via INTRAVENOUS
  Filled 2023-10-18: qty 4

## 2023-10-18 MED ORDER — ACETAMINOPHEN 650 MG RE SUPP: 650.0000 mg | Freq: Four times a day (QID) | RECTAL | Status: DC

## 2023-10-18 MED ORDER — BUTALBITAL-APAP-CAFFEINE 50-325-40 MG PO TABS
1.0000 | ORAL_TABLET | Freq: Four times a day (QID) | ORAL | Status: DC | PRN
  Administered 2023-10-18 – 2023-10-19 (×2): 1 via ORAL
  Filled 2023-10-18 (×2): qty 1

## 2023-10-18 NOTE — Progress Notes (Signed)
   Progress Note     Subjective: Persistent headache. Wife at bedside. He complained of some dizziness overnight as well. Awaiting CIR but sodium needs to be stable first.   Objective: Vital signs in last 24 hours: Temp:  [98.1 F (36.7 C)-98.8 F (37.1 C)] 98.4 F (36.9 C) (11/21 0812) Pulse Rate:  [60-75] 64 (11/21 0812) Resp:  [12-20] 17 (11/21 0812) BP: (125-158)/(48-75) 158/75 (11/21 0812) SpO2:  [95 %-98 %] 95 % (11/21 0812) Weight:  [59.6 kg] 59.6 kg (11/21 0431) Last BM Date : 10/16/23  Intake/Output from previous day: 11/20 0701 - 11/21 0700 In: 720 [P.O.:720] Out: 500 [Urine:500] Intake/Output this shift: No intake/output data recorded.  PE: General: pleasant, WD, thin male who is laying in bed in NAD Heart: regular, rate, and rhythm.   Lungs: CTAB, no wheezes, rhonchi, or rales noted.  Respiratory effort nonlabored Abd: soft, NT, ND Psych: A&Ox4 with an appropriate affect.    Lab Results:  No results for input(s): "WBC", "HGB", "HCT", "PLT" in the last 72 hours. BMET Recent Labs    10/16/23 0732 10/17/23 1051  NA 134* 132*  K 3.8 4.6  CL 102 96*  CO2 25 29  GLUCOSE 138* 173*  BUN 8 8  CREATININE 0.82 0.69  CALCIUM 8.6* 9.5   PT/INR No results for input(s): "LABPROT", "INR" in the last 72 hours. CMP     Component Value Date/Time   NA 132 (L) 10/17/2023 1051   K 4.6 10/17/2023 1051   CL 96 (L) 10/17/2023 1051   CO2 29 10/17/2023 1051   GLUCOSE 173 (H) 10/17/2023 1051   BUN 8 10/17/2023 1051   CREATININE 0.69 10/17/2023 1051   CALCIUM 9.5 10/17/2023 1051   PROT 6.8 10/07/2023 1315   ALBUMIN 3.8 10/07/2023 1315   AST 78 (H) 10/07/2023 1315   ALT 56 (H) 10/07/2023 1315   ALKPHOS 134 (H) 10/07/2023 1315   BILITOT 1.0 10/07/2023 1315   GFRNONAA >60 10/17/2023 1051   GFRAA >60 03/19/2020 0355   Lipase     Component Value Date/Time   LIPASE 153 (H) 12/21/2020 1610       Studies/Results: No results  found.  Anti-infectives: Anti-infectives (From admission, onward)    None        Assessment/Plan  Fall from a cart   TBI/SDH/SAH - NSGY c/s, Dr. Franky Macho, TBI team therapies. MS decline over last 24-48 hours. Last CT 11/11 overall stable. Concerning neuro exam 11/15 with weakness in prox LUE/LLE. Repeat head CT with slight increased IPH and MLS to 7mm.  Agitation - resolved Left temporal bone fracture, L TMJ FX -Dr. Elijah Birk recs for Ciprodex drops  ETOH use daily - CIWA DM - SSI HTN - home meds reordered. Persistently hypertensive, will d/w NSGY what goal BP is Hyponatremia - on 3G TID salt tabs and fluid restriction, repeat BMET pending. If Na still decreasing will give lasix  FEN - diet, salt tabs, encourage at least 2 Boost/Ensure per day DVT - SCDs, LMWH ID - no current abx, afeb Dispo - 4NP, continue therapies. BMET pending. Stable for DC to CIR when Na stable.   LOS: 11 days   I reviewed nursing notes, Consultant NS notes, last 24 h vitals and pain scores, last 48 h intake and output, and last 24 h labs and trends.   Juliet Rude, [facility] 10/18/2023, 8:28 AM Please see Amion for pager number during day hours 7:00am-4:30pm

## 2023-10-18 NOTE — Progress Notes (Signed)
Physical Therapy Treatment Patient Details Name: Charles Dorsey MRN: [id number] DOB: 10-19-1961 Today's Date: 10/18/2023   History of Present Illness 62 yo male presents [facility] on 11/10 with fall from cart while staining a deck, +ETOH. Pt sustained traumatic R SAH, R SDH,  L temporal bone fx, L TMJ fx. Head CT 11/16 slightly increased size of hemorrhagic contusions in the R frontal and temporal lobes, and slightly increased L midline shift now 7mm; transfer to ICU 11/16 for hypertonic saline. PMH includes DMII, HLD, coronary stent 2021.    PT Comments  Pt progressing slowly  toward goals, limited by some pain and dizziness.  Suspect signs of vestibular issues and will check in subsequent sessions.  Emphasis on transitions, safe sit to stand and progression of gait stability/stamina.     If plan is discharge home, recommend the following: A little help with walking and/or transfers;A little help with bathing/dressing/bathroom   Can travel by private vehicle        Equipment Recommendations  None recommended by PT    Recommendations for Other Services       Precautions / Restrictions Precautions Precautions: Fall     Mobility  Bed Mobility   Bed Mobility: Supine to Sit     Supine to sit: Contact guard (lower HOB)          Transfers Overall transfer level: Needs assistance   Transfers: Sit to/from Stand Sit to Stand: Min assist   Step pivot transfers: Min assist       General transfer comment: stability assist more than balance    Ambulation/Gait Ambulation/Gait assistance: Min assist Gait Distance (Feet): 100 Feet (50 feet after propped against wall to regroup from dizzy spell.) Assistive device: 1 person hand held assist (2 nd person close by for safety) Gait Pattern/deviations: Step-through pattern, Decreased stride length   Gait velocity interpretation: <1.8 ft/sec, indicate of risk for recurrent falls   General Gait Details: pt moderately unsteady with  scissoring and drifting both R and L.  Slower gait with narrowed BOS overall.  Pt reports dizziness, but no spinning related.  BP108/63 down from 109/68 pre gait.   Stairs             Wheelchair Mobility     Tilt Bed    Modified Rankin (Stroke Patients Only)       Balance Overall balance assessment: Needs assistance   Sitting balance-Leahy Scale: Fair (to good, able to move in and out of midline.)       Standing balance-Leahy Scale: Poor Standing balance comment: reliant on external assist                            Cognition Arousal: Alert Behavior During Therapy: Flat affect Overall Cognitive Status:  (NT formally)                 Rancho Levels of Cognitive Functioning Rancho Mirant Scales of Cognitive Functioning: Confused, Appropriate: Moderate Assistance                   [email or web address] Scales of Cognitive Functioning: Confused, Appropriate: Moderate Assistance [VI]    Exercises      General Comments        Pertinent Vitals/Pain Pain Assessment Pain Assessment: Faces Faces Pain Scale: Hurts a little bit Pain Location: head Pain Descriptors / Indicators: Headache Pain Intervention(s): Monitored during session    Home Living  Prior Function            PT Goals (current goals can now be found in the care plan section) Acute Rehab PT Goals PT Goal Formulation: With patient Time For Goal Achievement: 10/22/23 Potential to Achieve Goals: Good Progress towards PT goals: Progressing toward goals    Frequency    Min 1X/week      PT Plan      Co-evaluation              AM-PAC PT "6 Clicks" Mobility   Outcome Measure  Help needed turning from your back to your side while in a flat bed without using bedrails?: A Little Help needed moving from lying on your back to sitting on the side of a flat bed without using bedrails?: A Little Help needed moving to and from a  bed to a chair (including a wheelchair)?: A Little Help needed standing up from a chair using your arms (e.g., wheelchair or bedside chair)?: A Little Help needed to walk in hospital room?: A Little Help needed climbing 3-5 steps with a railing? : Total 6 Click Score: 16    End of Session   Activity Tolerance: Patient tolerated treatment well;Other (comment) (limited by dizziness, some pain) Patient left: in chair;with call bell/phone within reach;with family/visitor present Nurse Communication: Mobility status PT Visit Diagnosis: Other abnormalities of gait and mobility (R26.89)     Time: 0454-0981 PT Time Calculation (min) (ACUTE ONLY): 21 min  Charges:    $Gait Training: 8-22 mins                       10/18/2023  Jacinto Halim., PT Acute Rehabilitation Services [phone]  (office)   Eliseo Gum Arushi Partridge 10/18/2023, 4:47 PM

## 2023-10-18 NOTE — TOC Progression Note (Signed)
Transition of Care [facility]) - Progression Note    Patient Details  Name: Charles Dorsey MRN: [id number] Date of Birth: 02/23/1961  Transition of [facility]) CM/SW Contact  Astrid Drafts Berna Spare, RN Phone Number: 10/18/2023, 3:30pm  Clinical Narrative:    Awaiting medical stability for CIR.  Completed and signed FMLA paperwork returned to patient/family.  They are appreciative of assistance.   Expected Discharge Plan: IP Rehab Facility Barriers to Discharge: Continued Medical Work up  Expected Discharge Plan and Services   Discharge Planning Services: CM Consult Post Acute Care Choice: IP Rehab Living arrangements for the past 2 months: Single Family Home Expected Discharge Date: 10/10/23                                     Social Determinants of Health (SDOH) Interventions SDOH Screenings   Food Insecurity: No Food Insecurity (10/11/2023)  Housing: Low Risk  (10/11/2023)  Transportation Needs: No Transportation Needs (10/11/2023)  Utilities: Not At Risk (10/11/2023)  Tobacco Use: High Risk (10/07/2023)    Readmission Risk Interventions     No data to display         Quintella Baton, RN, BSN  Trauma/Neuro ICU Case Manager [phone]

## 2023-10-18 NOTE — Progress Notes (Signed)
[facility] IP rehab - Noted Na+ down to 128 this am.  I spoke with Tresa Endo, [place] and patient is not medically ready for CIR today.  Na+ needs to be stable and in a more normalized range prior to CIR.  [phone]

## 2023-10-19 ENCOUNTER — Encounter (HOSPITAL_COMMUNITY): Payer: Self-pay | Admitting: General Surgery

## 2023-10-19 ENCOUNTER — Inpatient Hospital Stay (HOSPITAL_COMMUNITY)
Admission: AD | Admit: 2023-10-19 | Discharge: 2023-10-25 | DRG: 945 | Disposition: A | Discharge status: Home / Self Care | Payer: BC Managed Care – PPO | Source: Intra-hospital | Attending: Physical Medicine and Rehabilitation | Admitting: Physical Medicine and Rehabilitation

## 2023-10-19 ENCOUNTER — Other Ambulatory Visit: Payer: Self-pay

## 2023-10-19 ENCOUNTER — Encounter (HOSPITAL_COMMUNITY): Payer: Self-pay

## 2023-10-19 DIAGNOSIS — R29810 Facial weakness: Secondary | ICD-10-CM | POA: Diagnosis present

## 2023-10-19 DIAGNOSIS — S069X9A Unspecified intracranial injury with loss of consciousness of unspecified duration, initial encounter: Secondary | ICD-10-CM | POA: Diagnosis not present

## 2023-10-19 DIAGNOSIS — S065XAD Traumatic subdural hemorrhage with loss of consciousness status unknown, subsequent encounter: Secondary | ICD-10-CM

## 2023-10-19 DIAGNOSIS — R509 Fever, unspecified: Secondary | ICD-10-CM | POA: Diagnosis present

## 2023-10-19 DIAGNOSIS — R7989 Other specified abnormal findings of blood chemistry: Secondary | ICD-10-CM | POA: Diagnosis present

## 2023-10-19 DIAGNOSIS — G44311 Acute post-traumatic headache, intractable: Secondary | ICD-10-CM | POA: Diagnosis not present

## 2023-10-19 DIAGNOSIS — I2510 Atherosclerotic heart disease of native coronary artery without angina pectoris: Secondary | ICD-10-CM | POA: Diagnosis present

## 2023-10-19 DIAGNOSIS — W11XXXD Fall on and from ladder, subsequent encounter: Secondary | ICD-10-CM | POA: Diagnosis present

## 2023-10-19 DIAGNOSIS — R5381 Other malaise: Principal | ICD-10-CM | POA: Diagnosis present

## 2023-10-19 DIAGNOSIS — I10 Essential (primary) hypertension: Secondary | ICD-10-CM | POA: Diagnosis present

## 2023-10-19 DIAGNOSIS — S0219XD Other fracture of base of skull, subsequent encounter for fracture with routine healing: Secondary | ICD-10-CM | POA: Diagnosis not present

## 2023-10-19 DIAGNOSIS — R109 Unspecified abdominal pain: Secondary | ICD-10-CM | POA: Diagnosis present

## 2023-10-19 DIAGNOSIS — H9190 Unspecified hearing loss, unspecified ear: Secondary | ICD-10-CM | POA: Diagnosis present

## 2023-10-19 DIAGNOSIS — S0269XD Fracture of mandible of other specified site, subsequent encounter for fracture with routine healing: Secondary | ICD-10-CM | POA: Diagnosis not present

## 2023-10-19 DIAGNOSIS — R42 Dizziness and giddiness: Secondary | ICD-10-CM | POA: Diagnosis present

## 2023-10-19 DIAGNOSIS — Z7982 Long term (current) use of aspirin: Secondary | ICD-10-CM | POA: Diagnosis not present

## 2023-10-19 DIAGNOSIS — K5900 Constipation, unspecified: Secondary | ICD-10-CM | POA: Diagnosis present

## 2023-10-19 DIAGNOSIS — Z8249 Family history of ischemic heart disease and other diseases of the circulatory system: Secondary | ICD-10-CM | POA: Diagnosis not present

## 2023-10-19 DIAGNOSIS — E039 Hypothyroidism, unspecified: Secondary | ICD-10-CM | POA: Diagnosis present

## 2023-10-19 DIAGNOSIS — E871 Hypo-osmolality and hyponatremia: Secondary | ICD-10-CM | POA: Insufficient documentation

## 2023-10-19 DIAGNOSIS — R519 Headache, unspecified: Secondary | ICD-10-CM | POA: Insufficient documentation

## 2023-10-19 DIAGNOSIS — E119 Type 2 diabetes mellitus without complications: Secondary | ICD-10-CM | POA: Diagnosis present

## 2023-10-19 DIAGNOSIS — I25118 Atherosclerotic heart disease of native coronary artery with other forms of angina pectoris: Secondary | ICD-10-CM

## 2023-10-19 DIAGNOSIS — S069X9S Unspecified intracranial injury with loss of consciousness of unspecified duration, sequela: Secondary | ICD-10-CM | POA: Diagnosis not present

## 2023-10-19 DIAGNOSIS — F17210 Nicotine dependence, cigarettes, uncomplicated: Secondary | ICD-10-CM | POA: Diagnosis present

## 2023-10-19 DIAGNOSIS — D62 Acute posthemorrhagic anemia: Secondary | ICD-10-CM | POA: Insufficient documentation

## 2023-10-19 DIAGNOSIS — K219 Gastro-esophageal reflux disease without esophagitis: Secondary | ICD-10-CM | POA: Diagnosis present

## 2023-10-19 DIAGNOSIS — Z83438 Family history of other disorder of lipoprotein metabolism and other lipidemia: Secondary | ICD-10-CM

## 2023-10-19 DIAGNOSIS — Z880 Allergy status to penicillin: Secondary | ICD-10-CM

## 2023-10-19 DIAGNOSIS — Z833 Family history of diabetes mellitus: Secondary | ICD-10-CM | POA: Diagnosis not present

## 2023-10-19 DIAGNOSIS — E785 Hyperlipidemia, unspecified: Secondary | ICD-10-CM | POA: Diagnosis present

## 2023-10-19 DIAGNOSIS — S065XAA Traumatic subdural hemorrhage with loss of consciousness status unknown, initial encounter: Secondary | ICD-10-CM | POA: Diagnosis present

## 2023-10-19 DIAGNOSIS — Z794 Long term (current) use of insulin: Secondary | ICD-10-CM | POA: Diagnosis not present

## 2023-10-19 DIAGNOSIS — S069XAA Unspecified intracranial injury with loss of consciousness status unknown, initial encounter: Principal | ICD-10-CM | POA: Diagnosis present

## 2023-10-19 DIAGNOSIS — S069X9D Unspecified intracranial injury with loss of consciousness of unspecified duration, subsequent encounter: Secondary | ICD-10-CM | POA: Diagnosis not present

## 2023-10-19 DIAGNOSIS — S0219XA Other fracture of base of skull, initial encounter for closed fracture: Secondary | ICD-10-CM | POA: Diagnosis present

## 2023-10-19 DIAGNOSIS — E1165 Type 2 diabetes mellitus with hyperglycemia: Secondary | ICD-10-CM | POA: Diagnosis not present

## 2023-10-19 DIAGNOSIS — S069XAD Unspecified intracranial injury with loss of consciousness status unknown, subsequent encounter: Secondary | ICD-10-CM | POA: Diagnosis not present

## 2023-10-19 LAB — BASIC METABOLIC PANEL
Anion gap: 12 (ref 5–15)
BUN: 25 mg/dL — ABNORMAL HIGH (ref 6–20)
CO2: 23 mmol/L (ref 22–32)
Calcium: 9.2 mg/dL (ref 8.9–10.3)
Chloride: 97 mmol/L — ABNORMAL LOW (ref 98–111)
Creatinine, Ser: 0.66 mg/dL (ref 0.61–1.24)
GFR, Estimated: >60 mL/min (ref >60)
Glucose, Bld: 110 mg/dL — ABNORMAL HIGH (ref 70–99)
Potassium: 3.8 mmol/L (ref 3.5–5.1)
Sodium: 132 mmol/L — ABNORMAL LOW (ref 135–145)

## 2023-10-19 LAB — GLUCOSE, CAPILLARY
Glucose-Capillary: 124 mg/dL — ABNORMAL HIGH (ref 70–99)
Glucose-Capillary: 178 mg/dL — ABNORMAL HIGH (ref 70–99)
Glucose-Capillary: 271 mg/dL — ABNORMAL HIGH (ref 70–99)
Glucose-Capillary: 191 mg/dL — ABNORMAL HIGH (ref 70–99)

## 2023-10-19 MED ORDER — INSULIN ASPART 100 UNIT/ML IJ SOLN
0.0000 [IU] | Freq: Every day | INTRAMUSCULAR | Status: DC
  Administered 2023-10-20: 2 [IU] via SUBCUTANEOUS

## 2023-10-19 MED ORDER — CHLORHEXIDINE GLUCONATE CLOTH 2 % EX PADS
6.0000 | MEDICATED_PAD | Freq: Every day | CUTANEOUS | Status: DC
Start: 2023-10-20 — End: 2023-10-25
  Administered 2023-10-20 – 2023-10-25 (×5): 6 via TOPICAL

## 2023-10-19 MED ORDER — NAPHAZOLINE-GLYCERIN 0.012-0.25 % OP SOLN
2.0000 [drp] | Freq: Four times a day (QID) | OPHTHALMIC | Status: DC
  Administered 2023-10-19 – 2023-10-25 (×21): 2 [drp] via OPHTHALMIC
  Filled 2023-10-19: qty 15

## 2023-10-19 MED ORDER — ACETAMINOPHEN 325 MG PO TABS
325.0000 mg | ORAL_TABLET | ORAL | Status: DC | PRN
  Administered 2023-10-19 – 2023-10-20 (×3): 650 mg via ORAL
  Filled 2023-10-19 (×3): qty 2

## 2023-10-19 MED ORDER — NITROGLYCERIN 0.4 MG SL SUBL: 0.4000 mg | SUBLINGUAL_TABLET | SUBLINGUAL | Status: DC | PRN

## 2023-10-19 MED ORDER — INSULIN ASPART 100 UNIT/ML IJ SOLN
0.0000 [IU] | Freq: Three times a day (TID) | INTRAMUSCULAR | Status: DC
  Administered 2023-10-19: 3 [IU] via SUBCUTANEOUS
  Administered 2023-10-20: 1 [IU] via SUBCUTANEOUS
  Administered 2023-10-20: 2 [IU] via SUBCUTANEOUS
  Administered 2023-10-21: 1 [IU] via SUBCUTANEOUS
  Administered 2023-10-21: 2 [IU] via SUBCUTANEOUS
  Administered 2023-10-21: 3 [IU] via SUBCUTANEOUS
  Administered 2023-10-22: 1 [IU] via SUBCUTANEOUS
  Administered 2023-10-23: 2 [IU] via SUBCUTANEOUS

## 2023-10-19 MED ORDER — MELATONIN 5 MG PO TABS: 5.0000 mg | ORAL_TABLET | Freq: Every evening | ORAL | Status: DC | PRN

## 2023-10-19 MED ORDER — ORAL CARE MOUTH RINSE: 15.0000 mL | OROMUCOSAL | Status: DC | PRN

## 2023-10-19 MED ORDER — PNEUMOCOCCAL 20-VAL CONJ VACC 0.5 ML IM SUSY
0.5000 mL | PREFILLED_SYRINGE | INTRAMUSCULAR | Status: DC
  Filled 2023-10-19: qty 0.5

## 2023-10-19 MED ORDER — JUVEN PO PACK
1.0000 | PACK | Freq: Two times a day (BID) | ORAL | Status: DC
  Administered 2023-10-19 – 2023-10-23 (×6): 1 via ORAL
  Filled 2023-10-19 (×9): qty 1

## 2023-10-19 MED ORDER — SODIUM CHLORIDE 1 G PO TABS
3.0000 g | ORAL_TABLET | Freq: Three times a day (TID) | ORAL | Status: DC
  Administered 2023-10-19 – 2023-10-25 (×17): 3 g via ORAL
  Filled 2023-10-19 (×18): qty 3

## 2023-10-19 MED ORDER — METOPROLOL SUCCINATE ER 50 MG PO TB24
50.0000 mg | ORAL_TABLET | Freq: Every day | ORAL | Status: DC
  Administered 2023-10-20 – 2023-10-25 (×6): 50 mg via ORAL
  Filled 2023-10-19 (×6): qty 1

## 2023-10-19 MED ORDER — LOSARTAN POTASSIUM 25 MG PO TABS
25.0000 mg | ORAL_TABLET | Freq: Every day | ORAL | Status: DC
  Administered 2023-10-20 – 2023-10-21 (×2): 25 mg via ORAL
  Filled 2023-10-19 (×2): qty 1

## 2023-10-19 MED ORDER — INSULIN GLARGINE-YFGN 100 UNIT/ML ~~LOC~~ SOLN
10.0000 [IU] | Freq: Every day | SUBCUTANEOUS | Status: DC
  Administered 2023-10-20 – 2023-10-21 (×2): 10 [IU] via SUBCUTANEOUS
  Filled 2023-10-19 (×2): qty 0.1

## 2023-10-19 MED ORDER — ENSURE MAX PROTEIN PO LIQD
11.0000 [oz_av] | Freq: Two times a day (BID) | ORAL | Status: DC
  Administered 2023-10-19 – 2023-10-25 (×9): 11 [oz_av] via ORAL

## 2023-10-19 MED ORDER — ENOXAPARIN SODIUM 40 MG/0.4ML IJ SOSY
40.0000 mg | PREFILLED_SYRINGE | INTRAMUSCULAR | Status: DC
  Administered 2023-10-20 – 2023-10-24 (×5): 40 mg via SUBCUTANEOUS
  Filled 2023-10-19 (×7): qty 0.4

## 2023-10-19 MED ORDER — FLEET ENEMA RE ENEM: 1.0000 | ENEMA | Freq: Once | RECTAL | Status: DC | PRN

## 2023-10-19 MED ORDER — ALUM & MAG HYDROXIDE-SIMETH 200-200-20 MG/5ML PO SUSP
30.0000 mL | ORAL | Status: DC | PRN
Start: 2023-10-19 — End: 2023-10-25

## 2023-10-19 MED ORDER — PROCHLORPERAZINE EDISYLATE 10 MG/2ML IJ SOLN: 5.0000 mg | Freq: Four times a day (QID) | INTRAMUSCULAR | Status: DC | PRN

## 2023-10-19 MED ORDER — ADULT MULTIVITAMIN W/MINERALS CH
1.0000 | ORAL_TABLET | Freq: Every day | ORAL | Status: DC
  Administered 2023-10-20 – 2023-10-25 (×6): 1 via ORAL
  Filled 2023-10-19 (×6): qty 1

## 2023-10-19 MED ORDER — BISACODYL 10 MG RE SUPP: 10.0000 mg | Freq: Every day | RECTAL | Status: DC | PRN

## 2023-10-19 MED ORDER — GABAPENTIN 300 MG PO CAPS
600.0000 mg | ORAL_CAPSULE | Freq: Every day | ORAL | Status: DC
  Administered 2023-10-20 (×2): 600 mg via ORAL
  Filled 2023-10-19 (×2): qty 2

## 2023-10-19 MED ORDER — PROCHLORPERAZINE 25 MG RE SUPP: 12.5000 mg | Freq: Four times a day (QID) | RECTAL | Status: DC | PRN

## 2023-10-19 MED ORDER — GUAIFENESIN-DM 100-10 MG/5ML PO SYRP: 5.0000 mL | ORAL_SOLUTION | Freq: Four times a day (QID) | ORAL | Status: DC | PRN

## 2023-10-19 MED ORDER — UREA 15 G PO PACK
15.0000 g | PACK | Freq: Two times a day (BID) | ORAL | Status: DC
  Administered 2023-10-19 – 2023-10-25 (×12): 15 g via ORAL
  Filled 2023-10-19 (×12): qty 1

## 2023-10-19 MED ORDER — POLYETHYLENE GLYCOL 3350 17 G PO PACK
17.0000 g | PACK | Freq: Two times a day (BID) | ORAL | Status: DC
  Administered 2023-10-19 – 2023-10-25 (×11): 17 g via ORAL
  Filled 2023-10-19 (×11): qty 1

## 2023-10-19 MED ORDER — PANTOPRAZOLE SODIUM 20 MG PO TBEC
20.0000 mg | DELAYED_RELEASE_TABLET | Freq: Every day | ORAL | Status: DC
Start: 2023-10-19 — End: 2023-10-25
  Administered 2023-10-19 – 2023-10-25 (×7): 20 mg via ORAL
  Filled 2023-10-19 (×7): qty 1

## 2023-10-19 MED ORDER — FOLIC ACID 1 MG PO TABS
1.0000 mg | ORAL_TABLET | Freq: Every day | ORAL | Status: DC
Start: 2023-10-20 — End: 2023-10-25
  Administered 2023-10-20 – 2023-10-25 (×6): 1 mg via ORAL
  Filled 2023-10-19 (×6): qty 1

## 2023-10-19 MED ORDER — DIPHENHYDRAMINE HCL 25 MG PO CAPS: 25.0000 mg | ORAL_CAPSULE | Freq: Four times a day (QID) | ORAL | Status: DC | PRN

## 2023-10-19 MED ORDER — GABAPENTIN 300 MG PO CAPS
300.0000 mg | ORAL_CAPSULE | Freq: Two times a day (BID) | ORAL | Status: DC
  Administered 2023-10-19 – 2023-10-21 (×4): 300 mg via ORAL
  Filled 2023-10-19 (×4): qty 1

## 2023-10-19 MED ORDER — PROSOURCE PLUS PO LIQD
30.0000 mL | Freq: Two times a day (BID) | ORAL | Status: DC
  Administered 2023-10-19 – 2023-10-24 (×10): 30 mL via ORAL
  Filled 2023-10-19 (×9): qty 30

## 2023-10-19 MED ORDER — PROCHLORPERAZINE MALEATE 5 MG PO TABS: 5.0000 mg | ORAL_TABLET | Freq: Four times a day (QID) | ORAL | Status: DC | PRN

## 2023-10-19 MED ORDER — DOCUSATE SODIUM 100 MG PO CAPS
100.0000 mg | ORAL_CAPSULE | Freq: Two times a day (BID) | ORAL | Status: DC
Start: 2023-10-19 — End: 2023-10-20
  Administered 2023-10-19: 100 mg via ORAL
  Filled 2023-10-19 (×2): qty 1

## 2023-10-19 MED ORDER — LEVETIRACETAM 500 MG PO TABS
500.0000 mg | ORAL_TABLET | Freq: Two times a day (BID) | ORAL | Status: DC
  Administered 2023-10-19 – 2023-10-25 (×12): 500 mg via ORAL
  Filled 2023-10-19 (×12): qty 1

## 2023-10-19 NOTE — Plan of Care (Signed)
  Problem: Education: Goal: Knowledge of General Education information will improve Description: Including pain rating scale, medication(s)/side effects and non-pharmacologic comfort measures Outcome: Progressing   Problem: Clinical Measurements: Goal: Ability to maintain clinical measurements within normal limits will improve Outcome: Progressing   Problem: Activity: Goal: Risk for activity intolerance will decrease Outcome: Progressing   Problem: Pain Management: Goal: General experience of comfort will improve Outcome: Progressing   Problem: Safety: Goal: Ability to remain free from injury will improve Outcome: Progressing   Problem: Education: Goal: Knowledge of the prescribed therapeutic regimen Outcome: Progressing Goal: Knowledge of disease or condition will improve Outcome: Progressing   Problem: Clinical Measurements: Goal: Neurologic status will improve Outcome: Progressing   Problem: Health Behavior/Discharge Planning: Goal: Ability to manage health-related needs will improve Outcome: Progressing

## 2023-10-19 NOTE — Plan of Care (Signed)
  Problem: Education: Goal: Ability to describe self-care measures that may prevent or decrease complications (Diabetes Survival Skills Education) will improve Outcome: Progressing Goal: Individualized Educational Video(s) Outcome: Progressing   Problem: Coping: Goal: Ability to adjust to condition or change in health will improve Outcome: Progressing   Problem: Fluid Volume: Goal: Ability to maintain a balanced intake and output will improve Outcome: Progressing   Problem: Health Behavior/Discharge Planning: Goal: Ability to identify and utilize available resources and services will improve Outcome: Progressing Goal: Ability to manage health-related needs will improve Outcome: Progressing   Problem: Metabolic: Goal: Ability to maintain appropriate glucose levels will improve Outcome: Progressing   Problem: Nutritional: Goal: Maintenance of adequate nutrition will improve Outcome: Progressing Goal: Progress toward achieving an optimal weight will improve Outcome: Progressing   Problem: Skin Integrity: Goal: Risk for impaired skin integrity will decrease Outcome: Progressing   Problem: Tissue Perfusion: Goal: Adequacy of tissue perfusion will improve Outcome: Progressing   Problem: Education: Goal: Knowledge of General Education information will improve Description: Including pain rating scale, medication(s)/side effects and non-pharmacologic comfort measures Outcome: Progressing   Problem: Health Behavior/Discharge Planning: Goal: Ability to manage health-related needs will improve Outcome: Progressing   Problem: Clinical Measurements: Goal: Ability to maintain clinical measurements within normal limits will improve Outcome: Progressing Goal: Will remain free from infection Outcome: Progressing Goal: Diagnostic test results will improve Outcome: Progressing Goal: Respiratory complications will improve Outcome: Progressing Goal: Cardiovascular complication will  be avoided Outcome: Progressing   Problem: Activity: Goal: Risk for activity intolerance will decrease Outcome: Progressing   Problem: Nutrition: Goal: Adequate nutrition will be maintained Outcome: Progressing   Problem: Coping: Goal: Level of anxiety will decrease Outcome: Progressing   Problem: Elimination: Goal: Will not experience complications related to bowel motility Outcome: Progressing Goal: Will not experience complications related to urinary retention Outcome: Progressing   Problem: Pain Management: Goal: General experience of comfort will improve Outcome: Progressing   Problem: Safety: Goal: Ability to remain free from injury will improve Outcome: Progressing   Problem: Skin Integrity: Goal: Risk for impaired skin integrity will decrease Outcome: Progressing   Problem: Education: Goal: Knowledge of the prescribed therapeutic regimen Outcome: Progressing Goal: Knowledge of disease or condition will improve Outcome: Progressing   Problem: Clinical Measurements: Goal: Neurologic status will improve Outcome: Progressing   Problem: Tissue Perfusion: Goal: Ability to maintain intracranial pressure will improve Outcome: Progressing   Problem: Respiratory: Goal: Will regain and/or maintain adequate ventilation Outcome: Progressing   Problem: Skin Integrity: Goal: Risk for impaired skin integrity will decrease Outcome: Progressing Goal: Demonstration of wound healing without infection will improve Outcome: Progressing   Problem: Psychosocial: Goal: Ability to verbalize positive feelings about self will improve Outcome: Progressing Goal: Ability to participate in self-care as condition permits will improve Outcome: Progressing Goal: Ability to identify appropriate support needs will improve Outcome: Progressing   Problem: Health Behavior/Discharge Planning: Goal: Ability to manage health-related needs will improve Outcome: Progressing   Problem:  Nutritional: Goal: Risk of aspiration will decrease Outcome: Progressing Goal: Dietary intake will improve Outcome: Progressing   Problem: Communication: Goal: Ability to communicate needs accurately will improve Outcome: Progressing

## 2023-10-19 NOTE — Progress Notes (Signed)
Trauma/Critical Care Follow Up Note  Subjective:    Overnight Issues:   Objective:  Vital signs for last 24 hours: Temp:  [98 F (36.7 C)-99.4 F (37.4 C)] 98.3 F (36.8 C) (11/22 0755) Pulse Rate:  [62-73] 73 (11/22 0755) Resp:  [11-17] 15 (11/22 0755) BP: (109-158)/(49-97) 121/49 (11/22 0755) SpO2:  [90 %-98 %] 90 % (11/22 0755)  Hemodynamic parameters for last 24 hours:    Intake/Output from previous day: No intake/output data recorded.  Intake/Output this shift: No intake/output data recorded.  Vent settings for last 24 hours:    Physical Exam:  Gen: comfortable, no distress Neuro: follows commands, alert, communicative HEENT: PERRL Neck: supple CV: RRR Pulm: unlabored breathing on RA Abd: soft, NT    GU: urine clear and yellow, +spontaneous voids Extr: wwp, no edema  Results for orders placed or performed during the hospital encounter of 10/07/23 (from the past 24 hour(s))  Glucose, capillary     Status: Abnormal   Collection Time: 10/18/23 11:36 AM  Result Value Ref Range   Glucose-Capillary 240 (H) 70 - 99 mg/dL  Glucose, capillary     Status: Abnormal   Collection Time: 10/18/23  4:18 PM  Result Value Ref Range   Glucose-Capillary 126 (H) 70 - 99 mg/dL  Glucose, capillary     Status: Abnormal   Collection Time: 10/18/23  7:27 PM  Result Value Ref Range   Glucose-Capillary 203 (H) 70 - 99 mg/dL  Glucose, capillary     Status: Abnormal   Collection Time: 10/18/23 11:19 PM  Result Value Ref Range   Glucose-Capillary 104 (H) 70 - 99 mg/dL  Glucose, capillary     Status: Abnormal   Collection Time: 10/19/23  3:32 AM  Result Value Ref Range   Glucose-Capillary 124 (H) 70 - 99 mg/dL  Basic metabolic panel     Status: Abnormal   Collection Time: 10/19/23  6:47 AM  Result Value Ref Range   Sodium 132 (L) 135 - 145 mmol/L   Potassium 3.8 3.5 - 5.1 mmol/L   Chloride 97 (L) 98 - 111 mmol/L   CO2 23 22 - 32 mmol/L   Glucose, Bld 110 (H) 70 - 99 mg/dL    BUN 25 (H) 6 - 20 mg/dL   Creatinine, Ser 1.61 0.61 - 1.24 mg/dL   Calcium 9.2 8.9 - 09.6 mg/dL   GFR, Estimated >04 >54 mL/min   Anion gap 12 5 - 15  Glucose, capillary     Status: Abnormal   Collection Time: 10/19/23  7:52 AM  Result Value Ref Range   Glucose-Capillary 178 (H) 70 - 99 mg/dL    Assessment & Plan:  Present on Admission:  SDH (subdural hematoma) (HCC)    LOS: 12 days   Additional comments:I reviewed the patient's new clinical lab test results.   and I reviewed the patients new imaging test results.    Fall from a cart   TBI/SDH/SAH - NSGY c/s, Dr. Franky Macho, TBI team therapies. MS decline over last 24-48 hours. Last CT 11/11 overall stable. Concerning neuro exam 11/15 with weakness in prox LUE/LLE. Repeat head CT with slight increased IPH and MLS to 7mm.  Agitation - resolved Left temporal bone fracture, L TMJ FX -Dr. Elijah Birk recs for Ciprodex drops  ETOH use daily - CIWA DM - SSI HTN - home meds reordered Hyponatremia - on 3G TID salt tabs and fluid restriction, good response to lasix yest, 132 today    FEN - diet, salt  tabs, encourage at least 2 Boost/Ensure per day, FW restriction, lasix yest and sodium improved DVT - SCDs, LMWH ID - no current abx, afeb Dispo - 4NP, continue therapies. Stable for DC to CIR today.    Diamantina Monks, MD Trauma & General Surgery Please use [email or web address] to contact on call provider  10/19/2023  *Care during the described time interval was provided by me. I have reviewed this patient's available data, including medical history, events of note, physical examination and test results as part of my evaluation.

## 2023-10-19 NOTE — H&P (Signed)
Expand All Collapse All      Physical Medicine and Rehabilitation Admission H&P        Chief Complaint  Patient presents with   Fall with TBI      HPI: Charles Dorsey is a 62 year old [place] male of [place] descent with history of T2DM, CAD who was admitted on 10/07/23 after falling 10 feet off a ladder on a chart while trying to paint his carport.  ETOH level 62 at admission. He struck his head, has amnesia of events, was noted to have bleeding from left ear and was nauseous enroute to ED. He was found to have SAH and SDH along right cerebral convexity, SDH extending along right tentorial leaflet and posterior falx, longitudinal fracture left temporal bone with blood products left mastoid air cell and middle ear with fracture extending into left parietal bone and let TMJ as well as incidental finding of right disc bulge C5/C6 with moderate spinal canal narrowing.  Ct abdomen/pelvis showed diffuse mucosal enhancement of colon with hyperemia favored to be nonspecific infectious or inflammatory, enlarged/heterogenous thyroid gland and multiple pulmonary nodules 5 mm (no follow up recommended). ENT/Dr.Caldwell consulted and recommended Ciprodex added to left ear canal and defer TMJ management to oral surgery. Surgical decompression if patient losses movement of left facial nerve.   Dr. Franky Macho recommended monitoring with serial CT head which showed "blossoming contusions right frontal and right temporal lobe"     He had sudden onset of unresponsiveness on 11/16 with CT head showing continued contusions with edema felt to be due to progressive hyponatremia and TBI. Hypertonic saline added and Keppra resumed by Dr. Jordan Likes. He had improvement in mentation with increased ability to follow commands and hypertonic saline weaned off by 11/18 with improvement. Sodium started trending down again and Urea added 11/20 with increase in sodium chloride tabs to 3 mg TID.  Sodium trended down to 128 on 11/21 which  was treated with dose of lasix and has improved to 132. Therapy has been working with patient who continues to be limited by pain, dizziness question due to vestibular issues, unsteady and scissoring gait requiring min assist.  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 CIR recommended due to functional decline.      Review of Systems  Constitutional:  Negative for chills and fever.  HENT:  Negative for hearing loss and tinnitus.   Eyes:  Positive for pain.  Respiratory:  Negative for cough and shortness of breath.   Cardiovascular:  Negative for chest pain and leg swelling.  Gastrointestinal:  Positive for abdominal pain (last night--likely due to gas?) and heartburn. Negative for constipation.  Genitourinary:  Negative for dysuria.  Musculoskeletal:  Positive for myalgias.  Skin:  Negative for rash.  Neurological:  Positive for tingling (bilateral fingers and achiness bilateral calfs and occasionally from buttocks down.) and headaches.  Psychiatric/Behavioral:  The patient has insomnia (for the past month due to family concerns).           Past Medical History:  Diagnosis Date   Acid reflux     Diabetes mellitus without complication (HCC)     Hyperlipidemia     Hypothyroidism                 Past Surgical History:  Procedure Laterality Date   CORONARY STENT INTERVENTION N/A 03/18/2020    Procedure: CORONARY STENT INTERVENTION;  Surgeon: Elder Negus, MD;  Location: [facility];  Service: Cardiovascular;  Laterality: N/A;   CORONARY ULTRASOUND/IVUS N/A 03/18/2020    Procedure: Intravascular Ultrasound/IVUS;  Surgeon: Elder Negus, MD;  Location: [facility];  Service: Cardiovascular;  Laterality: N/A;   LEFT HEART CATH AND CORONARY ANGIOGRAPHY N/A 03/18/2020    Procedure: LEFT HEART CATH AND CORONARY ANGIOGRAPHY;  Surgeon: Elder Negus, MD;  Location: [facility];  Service: Cardiovascular;  Laterality: N/A;                Family History  Problem Relation Age of Onset   Heart disease Father     Diabetes Father     Hyperlipidemia Father            Social History:  Married. Independent and working PTA. He reports that he has been smoking cigarettes. He has never used smokeless tobacco. He reports current alcohol use. He reports that he does not use drugs.     Allergies:  Allergies  No Known Allergies             Medications Prior to Admission  Medication Sig Dispense Refill   acetaminophen (TYLENOL) 500 MG tablet Take 500 mg by mouth every 6 (six) hours as needed for headache.       aspirin EC 81 MG tablet Take 1 tablet (81 mg total) by mouth daily. Swallow whole. 90 tablet 3   nitroGLYCERIN (NITROSTAT) 0.4 MG SL tablet PLACE 1 TABLET UNDER TONGUE EVERY 5 MINUTES AS NEEDED CHEST PAIN 30 tablet 3   atorvastatin (LIPITOR) 40 MG tablet Take 1 tablet (40 mg total) by mouth daily. (Patient not taking: Reported on 10/07/2023) 90 tablet 3   losartan (COZAAR) 25 MG tablet Take 1 tablet (25 mg total) by mouth daily. (Patient not taking: Reported on 10/07/2023) 90 tablet 3   metoprolol succinate (TOPROL-XL) 50 MG 24 hr tablet Take 1 tablet (50 mg total) by mouth daily. Take with or immediately following a meal. (Patient not taking: Reported on 10/07/2023) 90 tablet 3   pantoprazole (PROTONIX) 20 MG tablet Take 1 tablet (20 mg total) by mouth daily. (Patient not taking: Reported on 10/07/2023) 30 tablet 0            Home: Home Living Family/patient expects to be discharged to:: Private residence Living Arrangements: Spouse/significant other, Children Available Help at Discharge: Available PRN/intermittently Type of Home: House Home Access: Stairs to enter [employer] of Steps: One step down after walking into the house Home Layout: Two level, Able to live on main level with bedroom/bathroom Bathroom Shower/Tub: Tub/shower unit, Engineer, building services: Standard Bathroom Accessibility:  Yes Home Equipment: None Additional Comments: 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  He likes to build stuff at home, deck, storage shed  Lives With: Spouse, Family   Functional History: Prior Function Prior Level of Function : Independent/Modified Independent   Functional Status:  Mobility: Bed Mobility Overal bed mobility: Needs Assistance Bed Mobility: Supine to Sit Supine to sit: Contact guard, HOB elevated, Used rails Sit to supine: Supervision General bed mobility comments: CGA with trunk Transfers Overall transfer level: Needs assistance Equipment used: 1 person hand held assist Transfers: Sit to/from Stand Sit to Stand: Min assist Bed to/from chair/wheelchair/BSC transfer type:: Step pivot Step pivot transfers: Min assist General transfer comment: patient asked to attempt mobility with no HHA and demonstrated poor balance without support Ambulation/Gait Ambulation/Gait assistance: Min assist Gait Distance (Feet): 100 Feet (50 feet after propped against wall to regroup from dizzy spell.) Assistive device:  1 person hand held assist (2 nd person close by for safety) Gait Pattern/deviations: Step-through pattern, Decreased stride length General Gait Details: pt moderately unsteady with scissoring and drifting both R and L.  Slower gait with narrowed BOS overall.  Pt reports dizziness, but no spinning related.  BP108/63 down from 109/68 pre gait. Gait velocity: decr Gait velocity interpretation: <1.8 ft/sec, indicate of risk for recurrent falls Stairs: Yes Stairs assistance: Supervision Stair Management: One rail Left, Alternating pattern, Forwards Number of Stairs: 5   ADL: ADL Overall ADL's : Needs assistance/impaired Eating/Feeding: Independent, Sitting Grooming: Wash/dry hands, Wash/dry face, Contact guard assist, Standing Grooming Details (indicate cue type and reason): at sink Upper Body Bathing: Set up, Sitting Lower Body Bathing: Moderate  assistance, Sit to/from stand Upper Body Dressing : Minimal assistance, Sitting Lower Body Dressing: Minimal assistance, Sitting/lateral leans Lower Body Dressing Details (indicate cue type and reason): min assist for changing socks Toilet Transfer: Contact guard assist Toilet Transfer Details (indicate cue type and reason): simulated to recliner Toileting- Clothing Manipulation and Hygiene: Moderate assistance, Sit to/from stand Tub/ Engineer, structural: Tub transfer, Minimal assistance, Ambulation Tub/Shower Transfer Details (indicate cue type and reason): stepping over simulated shower edge Functional mobility during ADLs: Minimal assistance General ADL Comments: HHA for mobility and transfers   Cognition: Cognition Overall Cognitive Status: Impaired/Different from baseline Arousal/Alertness: Awake/alert Orientation Level: Oriented X4 Attention: Sustained, Selective Sustained Attention: Appears intact Selective Attention: Impaired Selective Attention Impairment: Verbal basic Memory: Impaired Memory Impairment: Retrieval deficit Awareness: Impaired Awareness Impairment: Emergent impairment Behaviors: Impulsive Safety/Judgment: Impaired Ranch[place] [employer] Scales of Cognitive Functioning: Confused, Appropriate: Moderate Assistance Cognition Arousal: Alert Behavior During Therapy: Flat affect Overall Cognitive Status: Impaired/Different from baseline Area of Impairment: Attention, Following commands, Awareness, Problem solving, Safety/judgement Orientation Level: Person, Place, Time, Situation Current Attention Level: Selective Memory: Decreased short-term memory Following Commands: Follows one step commands with increased time Safety/Judgement: Decreased awareness of safety, Decreased awareness of deficits Awareness: Intellectual Problem Solving: Slow processing, Decreased initiation, Difficulty sequencing, Requires verbal cues, Requires tactile cues General Comments: cues for  safety   Physical Exam: Blood pressure (!) 121/49, pulse 73, temperature 98.3 F (36.8 C), temperature source Oral, resp. rate 15, height 5\' 8"  (1.727 m), weight 59.6 kg, SpO2 90%. Physical Exam Constitutional: No apparent distress. Appropriate appearance for age.  HENT: No JVD. Neck Supple. Trachea midline. Atraumatic, normocephalic. Eyes: PERRLA. EOMI. Visual fields grossly intact.  Cardiovascular: RRR, no murmurs/rub/gallops. No Edema. Peripheral pulses 2+  Respiratory: CTAB. No rales, rhonchi, or wheezing. On RA.  Abdomen: + bowel sounds, normoactive. No distention or tenderness.  Skin: C/D/I. No apparent lesions. MSK:      No apparent deformity.       Neurologic exam:  Cognition: AAO to person, place, time and event.  Language: Fluent, No substitutions or neoglisms. No dysarthria. Names 3/3 objects correctly.  Memory: Recalls 2/3 objects at 5 minutes. No apparent deficits  Insight: Good  insight into current condition.  Mood: Pleasant affect, appropriate mood.  Sensation: To light touch reduced in distal RUE and RLE Reflexes: 2+ in BL UE and LEs. Negative Hoffman's and babinski signs bilaterally.  CN:  Mild left facial weakness.  Coordination: No apparent tremors. No ataxia on FTN, HTS bilaterally.  Spasticity: MAS 0 in all extremities.  Strength:                RUE: 5/5 SA, 5/5 EF, 5/5 EE, 5/5 WE, 5/5 FF, 5/5 FA  LUE: 5-/5 SA, 5/5 EF, 5/5 EE, 5/5 WE, 5/5 FF, 5/5 FA                 RLE: 5/5 HF, 5/5 KE, 5/5 DF, 5/5 EHL, 5/5 PF                 LLE:  5/5 HF, 5/5 KE, 5/5 DF, 5/5 EHL, 5/5 PF        Lab Results Last 48 Hours        Results for orders placed or performed during the hospital encounter of 10/07/23 (from the past 48 hour(s))  Basic metabolic panel     Status: Abnormal    Collection Time: 10/17/23 10:51 AM  Result Value Ref Range    Sodium 132 (L) 135 - 145 mmol/L    Potassium 4.6 3.5 - 5.1 mmol/L    Chloride 96 (L) 98 - 111 mmol/L    CO2 29 22 -  32 mmol/L    Glucose, Bld 173 (H) 70 - 99 mg/dL      Comment: Glucose reference range applies only to samples taken after fasting for at least 8 hours.    BUN 8 6 - 20 mg/dL    Creatinine, Ser 1.61 0.61 - 1.24 mg/dL    Calcium 9.5 8.9 - 09.6 mg/dL    GFR, Estimated >04 >54 mL/min      Comment: (NOTE) Calculated using the CKD-EPI Creatinine Equation (2021)      Anion gap 7 5 - 15      Comment: Performed at [facility], [address], [place], [place] [address]  Glucose, capillary     Status: Abnormal    Collection Time: 10/17/23 11:56 AM  Result Value Ref Range    Glucose-Capillary 186 (H) 70 - 99 mg/dL      Comment: Glucose reference range applies only to samples taken after fasting for at least 8 hours.  Osmolality     Status: None    Collection Time: 10/17/23  1:36 PM  Result Value Ref Range    Osmolality 288 275 - 295 mOsm/kg      Comment: REPEATED TO VERIFY Performed at [facility], [address] [place], [place] [address]    Glucose, capillary     Status: Abnormal    Collection Time: 10/17/23  3:57 PM  Result Value Ref Range    Glucose-Capillary 267 (H) 70 - 99 mg/dL      Comment: Glucose reference range applies only to samples taken after fasting for at least 8 hours.  Glucose, capillary     Status: Abnormal    Collection Time: 10/17/23  5:02 PM  Result Value Ref Range    Glucose-Capillary 207 (H) 70 - 99 mg/dL      Comment: Glucose reference range applies only to samples taken after fasting for at least 8 hours.  Osmolality, urine     Status: None    Collection Time: 10/17/23  7:45 PM  Result Value Ref Range    Osmolality, Ur 578 300 - 900 mOsm/kg      Comment: REPEATED TO VERIFY Performed at [facility], [address], [place], [place] [address]    Sodium, urine, random     Status: None    Collection Time: 10/17/23  7:45 PM  Result Value Ref Range    Sodium, Ur 82 mmol/L      Comment: Performed at [facility], [address],  [place],  [place] [address]  Glucose, capillary     Status: Abnormal    Collection Time: 10/17/23  7:58 PM  Result Value Ref Range    Glucose-Capillary 164 (H) 70 - 99 mg/dL      Comment: Glucose reference range applies only to samples taken after fasting for at least 8 hours.  Glucose, capillary     Status: Abnormal    Collection Time: 10/17/23 11:15 PM  Result Value Ref Range    Glucose-Capillary 140 (H) 70 - 99 mg/dL      Comment: Glucose reference range applies only to samples taken after fasting for at least 8 hours.  Glucose, capillary     Status: Abnormal    Collection Time: 10/18/23  3:15 AM  Result Value Ref Range    Glucose-Capillary 159 (H) 70 - 99 mg/dL      Comment: Glucose reference range applies only to samples taken after fasting for at least 8 hours.  Glucose, capillary     Status: Abnormal    Collection Time: 10/18/23  8:08 AM  Result Value Ref Range    Glucose-Capillary 181 (H) 70 - 99 mg/dL      Comment: Glucose reference range applies only to samples taken after fasting for at least 8 hours.  Basic metabolic panel     Status: Abnormal    Collection Time: 10/18/23  8:25 AM  Result Value Ref Range    Sodium 128 (L) 135 - 145 mmol/L    Potassium 4.0 3.5 - 5.1 mmol/L    Chloride 96 (L) 98 - 111 mmol/L    CO2 24 22 - 32 mmol/L    Glucose, Bld 185 (H) 70 - 99 mg/dL      Comment: Glucose reference range applies only to samples taken after fasting for at least 8 hours.    BUN 16 6 - 20 mg/dL    Creatinine, Ser 9.62 (L) 0.61 - 1.24 mg/dL    Calcium 8.9 8.9 - 95.2 mg/dL    GFR, Estimated >84 >13 mL/min      Comment: (NOTE) Calculated using the CKD-EPI Creatinine Equation (2021)      Anion gap 8 5 - 15      Comment: Performed at [facility], [address], [place], [place] [address]  Glucose, capillary     Status: Abnormal    Collection Time: 10/18/23 11:36 AM  Result Value Ref Range    Glucose-Capillary 240 (H) 70 - 99 mg/dL      Comment: Glucose reference range  applies only to samples taken after fasting for at least 8 hours.  Glucose, capillary     Status: Abnormal    Collection Time: 10/18/23  4:18 PM  Result Value Ref Range    Glucose-Capillary 126 (H) 70 - 99 mg/dL      Comment: Glucose reference range applies only to samples taken after fasting for at least 8 hours.  Glucose, capillary     Status: Abnormal    Collection Time: 10/18/23  7:27 PM  Result Value Ref Range    Glucose-Capillary 203 (H) 70 - 99 mg/dL      Comment: Glucose reference range applies only to samples taken after fasting for at least 8 hours.  Glucose, capillary     Status: Abnormal    Collection Time: 10/18/23 11:19 PM  Result Value Ref Range    Glucose-Capillary 104 (H) 70 - 99 mg/dL      Comment: Glucose reference range applies only to samples taken  after fasting for at least 8 hours.  Glucose, capillary     Status: Abnormal    Collection Time: 10/19/23  3:32 AM  Result Value Ref Range    Glucose-Capillary 124 (H) 70 - 99 mg/dL      Comment: Glucose reference range applies only to samples taken after fasting for at least 8 hours.  Basic metabolic panel     Status: Abnormal    Collection Time: 10/19/23  6:47 AM  Result Value Ref Range    Sodium 132 (L) 135 - 145 mmol/L    Potassium 3.8 3.5 - 5.1 mmol/L    Chloride 97 (L) 98 - 111 mmol/L    CO2 23 22 - 32 mmol/L    Glucose, Bld 110 (H) 70 - 99 mg/dL      Comment: Glucose reference range applies only to samples taken after fasting for at least 8 hours.    BUN 25 (H) 6 - 20 mg/dL    Creatinine, Ser 1.19 0.61 - 1.24 mg/dL    Calcium 9.2 8.9 - 14.7 mg/dL    GFR, Estimated >82 >95 mL/min      Comment: (NOTE) Calculated using the CKD-EPI Creatinine Equation (2021)      Anion gap 12 5 - 15      Comment: Performed at [facility], [address], [place], [place] [address]  Glucose, capillary     Status: Abnormal    Collection Time: 10/19/23  7:52 AM  Result Value Ref Range    Glucose-Capillary 178 (H) 70 -  99 mg/dL      Comment: Glucose reference range applies only to samples taken after fasting for at least 8 hours.      Imaging Results (Last 48 hours)  No results found.         Blood pressure (!) 121/49, pulse 73, temperature 98.3 F (36.8 C), temperature source Oral, resp. rate 15, height 5\' 8"  (1.727 m), weight 59.6 kg, SpO2 90%.   Medical Problem List and Plan: 1. Functional deficits secondary to TBI s/p fall with polytrauma             -patient may shower             -ELOS/Goals: 5-7 days, Mod I PT/OT, SLP SPV              - Stable for IPR admission  2.  Antithrombotics: -DVT/anticoagulation:  Pharmaceutical: Lovenox             -antiplatelet therapy: Off ASA due to TBI/bleed 3. Persistent headaches/Pain Management: Continue tylenol qid. Fioricet/oxycodone ineffective --start gabapentin 300 mg bid with 600 mg at nights for sleep and pain per discussion w/Dr. Shearon Stalls 4. Mood/Behavior/Sleep: LCSW to follow for evaluation and support.              -antipsychotic agents: N/A             --gabapentin 600 mg/hs for sleep and pain. Trazodone prn also on board.  5. Neuropsych/cognition: This patient may be intermittently capable of making decisions on his own behalf. 6. Skin/Wound Care: Routine pressure relief measures.  7. Fluids/Electrolytes/Nutrition: Daily BMET to monitor sodium. Continue 1200 cc FR             --change ensure to Ensure max to prevent surgar spikes. Will add prostat also 8. FUO: Resolving. Attempt/encourage pulmonary hygiene with flutter valve qid             --leucocytosis on last CBC 11/15-->monitor  for signs of infection and for rise in white count --recheck CBC in am.   10. Hyponatremia: Cerebral salt wasting?Marland Kitchen On Ure-Na since 11/20 and salt tabs 3 mg TID             --monitor with daily checks as continues to fluctuate--> 132-->134-->134-->132-->128--132             --po intake seems to be variable. Needs to be fed per chart review  11. Pre-renal azotemia:  Likely due to FR and dose of lasix yesterday.  12. Dizziness: Order vestibular evaluation. Check orthostatic vitals to monitor for drop with activity             --may need scopolamine patch is symptoms limiting and BP stable 13. HTN: Monitor BP TID--continue Cozaar and metoprolol. Will set hold parameters. 14. T2DM: Monitor BS ac/hs. Hgb A1C- 8.4.  Po intake variable with fluctuating BS.              --continue Insulin glargline 10 units daily with SSI for elevated BS/tighter control.              --transition Insulin to metformin as intake improves. Ensure changed to ensure Max as lower carbs.  15. CAD s/p PTCS/stent LAD: followed by Dr. Sherri Sear. Treated with metoprolol, Cozaar, ASA, lipitor             --monitor for symptoms with activity.  16. Abdominal pain: Likely due to GERD--had BM today --PPI resumed.  17. Left TMJ fracture: No difficulty or pain with chewing. No facial pain or increase in weakness.  --Left facial weakness was noted by NS at admission. Monitor for now.  18. Seizure prophylaxis: Continue Keppra bid.    Jacquelynn Cree, PA-C 10/19/2023  I have examined the patient independently and edited the note for HPI, ROS, exam, assessment, and plan as appropriate. I am in agreement with the above recommendations.   Angelina Sheriff, DO 10/19/2023

## 2023-10-19 NOTE — Progress Notes (Signed)
Inpatient Rehabilitation Admission Medication Review by a Pharmacist  A complete drug regimen review was completed for this patient to identify any potential clinically significant medication issues.  High Risk Drug Classes Is patient taking? Indication by Medication  Antipsychotic Yes Compazine PRN- n/v   Anticoagulant Yes Lovenox- DVT ppx   Antibiotic No   Opioid No   Antiplatelet No   Hypoglycemics/insulin Yes Semglee,SSI - DM  Vasoactive Medication Yes Losartan, toprol- HTN   Chemotherapy No   Other Yes Docusate, miralax, bisacodyl PRN, fleet enema PRN Keppra- TBI, sz ppx  NTG PRN- chest pain/ACS  Urea, sodium chloride tabs- hyponatremia  Pantoprazole, maalox- reflux/dyspepsia  APAP PRN- mild pain  Benadryl PRN- itching  Robitussin DM PRN- cough  Melatonin PRN- sleep  Gabapentin- sleep/neuropathic pain      Type of Medication Issue Identified Description of Issue Recommendation(s)  Drug Interaction(s) (clinically significant)     Duplicate Therapy     Allergy     No Medication Administration End Date     Incorrect Dose     Additional Drug Therapy Needed     Significant med changes from prior encounter (inform family/care partners about these prior to discharge).    Other  Patient was not taking any of his prescribed medications prior to admission, bASA was discontinued d/t TBI      Clinically significant medication issues were identified that warrant physician communication and completion of prescribed/recommended actions by midnight of the next day:  No  Name of provider notified for urgent issues identified:   Provider Method of Notification:     Pharmacist comments:   Time spent performing this drug regimen review (minutes):  20   Jani Gravel, PharmD Clinical Pharmacist  10/19/2023 1:42 PM

## 2023-10-19 NOTE — Progress Notes (Signed)
Occupational Therapy Treatment Patient Details Name: Charles Dorsey MRN: [id number] DOB: 13-Nov-1961 Today's Date: 10/19/2023   History of present illness 62 yo male presents to [facility] on 11/10 with fall from cart while staining a deck, +ETOH. Pt sustained traumatic R SAH, R SDH,  L temporal bone fx, L TMJ fx. Head CT 11/16 slightly increased size of hemorrhagic contusions in the R frontal and temporal lobes, and slightly increased L midline shift now 7mm; transfer to ICU 11/16 for hypertonic saline. PMH includes DMII, HLD, coronary stent 2021.   OT comments  Patient received in supine and agreeable to OT session. Patient asked to ambulate in hallway and required CGA to min assist with HHA. Patient asking to walk without assistance and demonstrated poor posture and patient instructed to have assistance at this time. Patient performed grooming tasks at sink before completing session in recliner. Patient will benefit from intensive inpatient follow up therapy, >3 hours/day to address balance and safety with self care tasks. Acute OT to continue to follow.       If plan is discharge home, recommend the following:  Direct supervision/assist for medications management;Direct supervision/assist for financial management;Help with stairs or ramp for entrance;Assist for transportation;Assistance with cooking/housework;A little help with walking and/or transfers;A little help with bathing/dressing/bathroom   Equipment Recommendations  None recommended by OT    Recommendations for Other Services Rehab consult    Precautions / Restrictions Precautions Precautions: Fall Restrictions Weight Bearing Restrictions: No       Mobility Bed Mobility Overal bed mobility: Needs Assistance Bed Mobility: Supine to Sit     Supine to sit: Contact guard, HOB elevated, Used rails     General bed mobility comments: CGA with trunk    Transfers Overall transfer level: Needs assistance Equipment used: 1 person hand  held assist Transfers: Sit to/from Stand Sit to Stand: Min assist           General transfer comment: patient asked to attempt mobility with no HHA and demonstrated poor balance without support     Balance Overall balance assessment: Needs assistance Sitting-balance support: Feet supported Sitting balance-Leahy Scale: Fair Sitting balance - Comments: EOB   Standing balance support: Single extremity supported, No upper extremity supported, During functional activity Standing balance-Leahy Scale: Poor Standing balance comment: singe extremity support with dynamic balance, able to stand at sink for grooming with no UE support                           ADL either performed or assessed with clinical judgement   ADL Overall ADL's : Needs assistance/impaired     Grooming: Wash/dry hands;Wash/dry face;Contact guard assist;Standing Grooming Details (indicate cue type and reason): at sink         Upper Body Dressing : Minimal assistance;Sitting   Lower Body Dressing: Minimal assistance;Sitting/lateral leans   Toilet Transfer: Contact guard assist Toilet Transfer Details (indicate cue type and reason): simulated to recliner           General ADL Comments: HHA for mobility and transfers    Extremity/Trunk Assessment              Vision       Perception     Praxis      Cognition Arousal: Alert Behavior During Therapy: Flat affect Overall Cognitive Status: Impaired/Different from baseline                 Rancho Levels of Cognitive Functioning  Rancho Mirant Scales of Cognitive Functioning: Confused, Appropriate: Moderate Assistance   Current Attention Level: Selective Memory: Decreased short-term memory Following Commands: Follows one step commands with increased time Safety/Judgement: Decreased awareness of safety, Decreased awareness of deficits Awareness: Intellectual Problem Solving: Slow processing, Decreased initiation, Difficulty  sequencing, Requires verbal cues, Requires tactile cues General Comments: cues for safety Rancho Mirant Scales of Cognitive Functioning: Confused, Appropriate: Moderate Assistance [VI]      Exercises      Shoulder Instructions       General Comments 137/80 (96) in recliner at end of session    Pertinent Vitals/ Pain       Pain Assessment Pain Assessment: Faces Faces Pain Scale: Hurts little more Pain Location: head Pain Descriptors / Indicators: Headache Pain Intervention(s): Monitored during session  Home Living                                          Prior Functioning/Environment              Frequency  Min 1X/week        Progress Toward Goals  OT Goals(current goals can now be found in the care plan section)  Progress towards OT goals: Progressing toward goals  Acute Rehab OT Goals Patient Stated Goal: go home OT Goal Formulation: With patient Time For Goal Achievement: 10/22/23 Potential to Achieve Goals: Good ADL Goals Pt Will Perform Grooming: with supervision;standing Pt Will Perform Lower Body Bathing: with supervision;sit to/from stand Pt Will Perform Lower Body Dressing: with supervision;sit to/from stand Pt Will Transfer to Toilet: with supervision;bedside commode;stand pivot transfer Pt Will Perform Toileting - Clothing Manipulation and hygiene: with supervision;sit to/from stand Pt Will Perform Tub/Shower Transfer: Tub transfer;with supervision;tub bench;ambulating;rolling walker  Plan      Co-evaluation                 AM-PAC OT "6 Clicks" Daily Activity     Outcome Measure   Help from another person eating meals?: A Little Help from another person taking care of personal grooming?: A Little Help from another person toileting, which includes using toliet, bedpan, or urinal?: A Little Help from another person bathing (including washing, rinsing, drying)?: A Little Help from another person to put on and  taking off regular upper body clothing?: A Little Help from another person to put on and taking off regular lower body clothing?: A Lot 6 Click Score: 17    End of Session Equipment Utilized During Treatment: Gait belt  OT Visit Diagnosis: Unsteadiness on feet (R26.81);Other abnormalities of gait and mobility (R26.89);Muscle weakness (generalized) (M62.81);Pain;Other symptoms and signs involving cognitive function   Activity Tolerance Patient tolerated treatment well   Patient Left in chair;with call bell/phone within reach;with chair alarm set;with family/visitor present   Nurse Communication Mobility status        Time: 0800-0820 OT Time Calculation (min): 20 min  Charges: OT General Charges $OT Visit: 1 Visit OT Treatments $Therapeutic Activity: 8-22 mins  Alfonse Flavors, OTA Acute Rehabilitation Services  Office [phone]   Dewain Penning 10/19/2023, 10:19 AM

## 2023-10-19 NOTE — Progress Notes (Signed)
PMR Admission Coordinator Pre-Admission Assessment   Patient: Charles Dorsey is an 62 y.o., male MRN: [id number] DOB: 02-09-1961 Height: 5\' 8"  (172.7 cm) Weight: 64.2 kg   Insurance Information HMO:     PPO: yes     PCP:      IPA:      80/20:      OTHER:  PRIMARY:    [employer] [place]   Policy#: [id number]      Subscriber: pt CM Name:  Amy     Phone#: [phone]  Fax#: [phone]      Received faxed auth on 11/20 for admit 01/02-72/5/36 Pre-Cert#: [id number]      Employer:  Benefits:  Phone #:      Name:  Dolores Hoose Date: 11/27/2022 - 11/26/2198 Deductible: $1,500 ($0 met) OOP Max: $5,900 ($349.13 met) CIR: 70% coverage, 30% co-insurance SNF: 70% coverage, 30% co-insurance; with a limit of 100 days/cal yr (100 remaining) Outpatient:  $0 to $72 co-pay per visit pending provider type; limited by medical necessity Home Health:  70% coverage, 30% co-insurance; limited by medical necessity DME: 70% coverage, 30% co-insurance Providers: in network    SECONDARY:       Policy#:      Phone#:      Artist:       Phone#:    The Data processing manager" for patients in Inpatient Rehabilitation Facilities with attached "Privacy Act Statement-Health Care Records" was provided and verbally reviewed with: Patient   Emergency Contact Information Contact Information       Name Relation Home Work Mobile    Charles Dorsey Spouse [phone]   [phone]    Charles Dorsey Daughter     [phone]         Other Contacts   None on File        Current Medical History  Patient Admitting Diagnosis: TBI History of Present Illness:  HPI: Charles Dorsey is a 62 year old male with PMH of DMII, HLD, coronary stent 2021. who fell from a wheeled cart on 10/07/2023 and suffered left temporal bone and left TMJ fracture. He presented to the [facility] ED on  CT head  was significant for [facility]. Dr Franky Macho consulted. On initial exam, GCS [id number]. Exhibited mild agitation, moving all  extremities spontaneously. CT scan also revealed longitudinal fracture involving the left temporal bone which appears to spare the middle ear ossicles.  Patient also has a posterior TMJ fracture. ENT consulted, Dr. Elijah Birk. Started on IV steroids. Observed in ICU. Received Precedex. Started on Lovenox 30 mg BID for DVT prophylaxis. BAL 62. Head CT 11/16 slightly increased size of hemorrhagic contusions in the R frontal and temporal lobes, and slightly increased L midline shift now 7mm; transfer to ICU 11/16 for hypertonic saline. Pt. Seen by PT/OT/SLP and they recommend CIR to assist return to PLOF.    Complete NIHSS TOTAL: 0   Patient's medical record from [facility] has been reviewed by the rehabilitation admission coordinator and physician.   Past Medical History      Past Medical History:  Diagnosis Date   Acid reflux     Diabetes mellitus without complication (HCC)     Hyperlipidemia     Hypothyroidism            Has the patient had major surgery during 100 days prior to admission? Yes   Family History   family history includes Diabetes in his father; Heart disease in his father; Hyperlipidemia in  his father.   Current Medications  Current Medications    Current Facility-Administered Medications:    acetaminophen (TYLENOL) tablet 1,000 mg, 1,000 mg, Oral, Q6H, 1,000 mg at 10/10/23 0526 **OR** acetaminophen (TYLENOL) suppository 650 mg, 650 mg, Rectal, Q6H, Violeta Gelinas, MD, 650 mg at 10/08/23 1610   Chlorhexidine Gluconate Cloth 2 % PADS 6 each, 6 each, Topical, Daily, Violeta Gelinas, MD, 6 each at 10/09/23 1000   ciprofloxacin-dexamethasone (CIPRODEX) 0.3-0.1 % OTIC (EAR) suspension 2 drop, 2 drop, Left EAR, BID, Violeta Gelinas, MD, 2 drop at 10/10/23 1017   docusate sodium (COLACE) capsule 100 mg, 100 mg, Oral, BID, Violeta Gelinas, MD, 100 mg at 10/10/23 1017   enoxaparin (LOVENOX) injection 30 mg, 30 mg, Subcutaneous, Q12H, Lovick, Lennie Odor, MD, 30 mg  at 10/10/23 1017   hydrALAZINE (APRESOLINE) injection 10 mg, 10 mg, Intravenous, Q2H PRN, Violeta Gelinas, MD   insulin aspart (novoLOG) injection 0-9 Units, 0-9 Units, Subcutaneous, Q4H, Violeta Gelinas, MD, 2 Units at 10/10/23 0803   insulin glargine-yfgn (SEMGLEE) injection 10 Units, 10 Units, Subcutaneous, Daily, Violeta Gelinas, MD, 10 Units at 10/10/23 1016   ketorolac (TORADOL) 15 MG/ML injection 30 mg, 30 mg, Intravenous, Q6H, Lovick, Lennie Odor, MD, 30 mg at 10/10/23 0526   [EXPIRED] LORazepam (ATIVAN) injection 0-4 mg, 0-4 mg, Intravenous, Q6H, 1 mg at 10/09/23 0610 **FOLLOWED BY** LORazepam (ATIVAN) injection 0-4 mg, 0-4 mg, Intravenous, Q12H, Violeta Gelinas, MD   LORazepam (ATIVAN) tablet 1-4 mg, 1-4 mg, Oral, Q1H PRN **OR** LORazepam (ATIVAN) injection 1-4 mg, 1-4 mg, Intravenous, Q1H PRN, Violeta Gelinas, MD, 1 mg at 10/07/23 2205   metoprolol tartrate (LOPRESSOR) injection 5 mg, 5 mg, Intravenous, Q6H PRN, Violeta Gelinas, MD   morphine (PF) 2 MG/ML injection 2 mg, 2 mg, Intravenous, Q2H PRN, Violeta Gelinas, MD   morphine (PF) 4 MG/ML injection 4 mg, 4 mg, Intravenous, Q2H PRN, Violeta Gelinas, MD   ondansetron (ZOFRAN-ODT) disintegrating tablet 4 mg, 4 mg, Oral, Q6H PRN **OR** ondansetron (ZOFRAN) injection 4 mg, 4 mg, Intravenous, Q6H PRN, Violeta Gelinas, MD   Oral care mouth rinse, 15 mL, Mouth Rinse, PRN, Phylliss Blakes A, MD   oxyCODONE (Oxy IR/ROXICODONE) immediate release tablet 5-10 mg, 5-10 mg, Oral, Q4H PRN, Diamantina Monks, MD, 5 mg at 10/09/23 2037   polyethylene glycol (MIRALAX / GLYCOLAX) packet 17 g, 17 g, Oral, Daily PRN, Violeta Gelinas, MD   QUEtiapine (SEROQUEL) tablet 50 mg, 50 mg, Oral, BID, Lovick, Lennie Odor, MD, 50 mg at 10/10/23 1017   thiamine (VITAMIN B1) tablet 100 mg, 100 mg, Oral, Daily, Violeta Gelinas, MD, 100 mg at 10/10/23 1017     Patients Current Diet:  Diet Order                  Diet regular Room service appropriate? Yes with Assist; Fluid  consistency: Thin  Diet effective now                         Precautions / Restrictions Precautions Precautions: Fall Restrictions Weight Bearing Restrictions: No    Has the patient had 2 or more falls or a fall with injury in the past year? Yes   Prior Activity Level Community (5-7x/wk): Pt. active in community PTA   Prior Functional Level Self Care: Did the patient need help bathing, dressing, using the toilet or eating? Independent   Indoor Mobility: Did the patient need assistance with walking from room to room (with or without device)?  Independent   Stairs: Did the patient need assistance with internal or external stairs (with or without device)? Independent   Functional Cognition: Did the patient need help planning regular tasks such as shopping or remembering to take medications? Independent   Patient Information Are you of Hispanic, Latino/a,or Spanish origin?: A. No, not of Hispanic, Latino/a, or Spanish origin What is your race?: J. Other Asian Do you need or want an interpreter to communicate with a doctor or health care staff?: 1. Yes (Khmer/Cambodian)   Patient's Response To:  Health Literacy and Transportation Is the patient able to respond to health literacy and transportation needs?: Yes Health Literacy - How often do you need to have someone help you when you read instructions, pamphlets, or other written material from your doctor or pharmacy?: Never In the past 12 months, has lack of transportation kept you from medical appointments or from getting medications?: No In the past 12 months, has lack of transportation kept you from meetings, work, or from getting things needed for daily living?: No   Home Assistive Devices / Equipment Home Equipment: None   Prior Device Use: Indicate devices/aids used by the patient prior to current illness, exacerbation or injury? None of the above   Current Functional Level Cognition   Arousal/Alertness:  Awake/alert Overall Cognitive Status: Within Functional Limits for tasks assessed Orientation Level: Oriented X4 General Comments: Pt able to recall room number and 3 word recall.  Demonstrated awareness of balance deficits and slight dizziness. Attention: Sustained, Selective Sustained Attention: Appears intact Selective Attention: Impaired Selective Attention Impairment: Verbal basic Memory: Impaired Memory Impairment: Retrieval deficit Awareness: Impaired Awareness Impairment: Emergent impairment Behaviors: Impulsive Safety/Judgment: Impaired Rancho Mirant Scales of Cognitive Functioning: Automatic, Appropriate: Minimal Assistance for Daily Living Skills    Extremity Assessment (includes Sensation/Coordination)   Upper Extremity Assessment: Defer to OT evaluation  Lower Extremity Assessment: Generalized weakness     ADLs   Overall ADL's : Needs assistance/impaired Eating/Feeding: Independent, Sitting Grooming: Supervision/safety, Standing Grooming Details (indicate cue type and reason): simulated Upper Body Bathing: Set up, Sitting Lower Body Bathing: Moderate assistance, Sit to/from stand Lower Body Dressing: Moderate assistance, Sit to/from stand Toilet Transfer: Contact guard assist, Ambulation Toilet Transfer Details (indicate cue type and reason): without assistive device Toileting- Clothing Manipulation and Hygiene: Moderate assistance, Sit to/from stand Tub/ Engineer, structural: Tub transfer, Minimal assistance, Ambulation Tub/Shower Transfer Details (indicate cue type and reason): stepping over simulated shower edge Functional mobility during ADLs: Contact guard assist (ambulation without an assistive device) General ADL Comments: BP 158/75 in sitting.  Much improved dynamic standing balance compared to last session with this OT.  Discussed need for a shower seat at home for initial use and family will look to borrow/purchase from outside source as needed.      Mobility   Overal bed mobility: Modified Independent Bed Mobility: Supine to Sit, Sit to Supine Supine to sit: Modified independent (Device/Increase time) Sit to supine: Supervision General bed mobility comments: NO difficulty with supine to sit.  He reports dizziness 3/10 with transition     Transfers   Overall transfer level: Needs assistance Equipment used: None Transfers: Sit to/from Stand Sit to Stand: Contact guard assist Bed to/from chair/wheelchair/BSC transfer type:: Step pivot Step pivot transfers: Contact guard assist General transfer comment: MIn guard for standing and mobility with head turns.  Pt with only LOB when stepping over simulated shower edge without UE support.  Able to squat and pick up multiple items from the floor  without LOB.     Ambulation / Gait / Stairs / Wheelchair Mobility   Ambulation/Gait Ambulation/Gait assistance: Editor, commissioning (Feet): 400 Feet Assistive device: 1 person hand held assist Gait Pattern/deviations: Step-through pattern, Decreased stride length, Ataxic, Narrow base of support, Wide base of support General Gait Details: assist to steady, especially initially and when challenged. Gait velocity: decr     Posture / Balance Balance Overall balance assessment: Needs assistance Sitting-balance support: Single extremity supported Sitting balance-Leahy Scale: Good Standing balance support: No upper extremity supported Standing balance-Leahy Scale: Good High Level Balance Comments: weaving of gait with horizontal and vertical head turns; increased time to perform fast/slow gait, 180 deg turn and stop     Special needs/care consideration Skin   and Special service needs      Previous Home Environment (from acute therapy documentation) Living Arrangements: Spouse/significant other, Children (daughter - both pt's wife and daughter work first shift and pt works second shift)  Lives With: Spouse, Family Available Help at Discharge:  Available PRN/intermittently Type of Home: House Home Layout: Two level, Able to live on main level with bedroom/bathroom Home Access: Stairs to enter [employer] of Steps: One step down after walking into the house Bathroom Shower/Tub: Tub/shower unit, Engineer, building services: Standard Bathroom Accessibility: Yes How Accessible: Accessible via wheelchair, Accessible via walker Additional Comments: 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  He likes to build stuff at home, deck, storage shed   Discharge Living Setting Plans for Discharge Living Setting: House Type of Home at Discharge: House Discharge Home Layout: Two level, Able to live on main level with bedroom/bathroom Alternate Level Stairs-Rails: None Alternate Level Stairs-Number of Steps: flight Discharge Home Access: Stairs to enter Entrance Stairs-Rails: None Entrance Stairs-Number of Steps: 1 step down Discharge Bathroom Shower/Tub: Tub/shower unit Discharge Bathroom Toilet: Standard Discharge Bathroom Accessibility: Yes How Accessible: Accessible via walker, Accessible via wheelchair   Social/Family/Support Systems Patient Roles: Spouse Contact Information: [phone] Anticipated Caregiver: Tyr Pearce Anticipated Caregiver's Contact Information: Daugter to Work from home or take time off to care for Pt. Ability/Limitations of Caregiver: min A Caregiver Availability: 24/7 Discharge Plan Discussed with Primary Caregiver: Yes Is Caregiver In Agreement with Plan?: Yes Does Caregiver/Family have Issues with Lodging/Transportation while Pt is in Rehab?: No   Goals Patient/Family Goal for Rehab: PT/OT Mod I, SLP supervision Expected length of stay: 5-7 days Pt/Family Agrees to Admission and willing to participate: Yes Program Orientation Provided & Reviewed with Pt/Caregiver Including Roles  & Responsibilities: Yes   Decrease burden of Care through IP rehab admission: not anticipated    Possible need for SNF placement upon discharge: not anticipated   Patient Condition: I have reviewed medical records from [facility], spoken with CM, and patient. I met with patient at the bedside for inpatient rehabilitation assessment.  Patient will benefit from ongoing PT, OT, and SLP, can actively participate in 3 hours of therapy a day 5 days of the week, and can make measurable gains during the admission.  Patient will also benefit from the coordinated team approach during an Inpatient Acute Rehabilitation admission.  The patient will receive intensive therapy as well as Rehabilitation physician, nursing, social worker, and care management interventions.  Due to bladder management, safety, skin/wound care, disease management, medication administration, pain management, and patient education the patient requires 24 hour a day rehabilitation nursing.  The patient is currently Min A  with mobility and basic ADLs.  Discharge setting and  therapy post discharge at home with home health is anticipated.  Patient has agreed to participate in the Acute Inpatient Rehabilitation Program and will admit today.   Preadmission Screen Completed By:  Jeronimo Greaves, 10/10/2023 10:59 AM ______________________________________________________________________   Discussed status with Dr. Shearon Stalls on 10/19/23 at 930 and received approval for admission today.   Admission Coordinator:  Jeronimo Greaves, CCC-SLP, time 10/19/23 Dorna Bloom 1059    Assessment/Plan: Diagnosis: TBI s/p fall Does the need for close, 24 hr/day Medical supervision in concert with the patient's rehab needs make it unreasonable for this patient to be served in a less intensive setting? Yes Co-Morbidities requiring supervision/potential complications: Behavioral difficulty/aggitation s/p TBI, substance abuse Hx with ETOH, hyperglycemia with diabetes, hypertension, hyponatremia, poor pain control due to multiple facial fractures and  headache Due to safety, skin/wound care, disease management, medication administration, pain management, and patient education, does the patient require 24 hr/day rehab nursing? Yes Does the patient require coordinated care of a physician, rehab nurse, PT, OT, and SLP to address physical and functional deficits in the context of the above medical diagnosis(es)? Yes Addressing deficits in the following areas: balance, endurance, locomotion, strength, transferring, bathing, dressing, feeding, grooming, toileting, cognition, speech, and language Can the patient actively participate in an intensive therapy program of at least 3 hrs of therapy 5 days a week? Yes The potential for patient to make measurable gains while on inpatient rehab is good Anticipated functional outcomes upon discharge from inpatient rehab: modified independent PT, modified independent OT, supervision SLP Estimated rehab length of stay to reach the above functional goals is: 5-7 days Anticipated discharge destination: Home 10. Overall Rehab/Functional Prognosis: good     MD Signature:

## 2023-10-19 NOTE — Discharge Summary (Signed)
Physician Discharge Summary  Patient ID: QUE LADUCA MRN: [id number] DOB/AGE: [birth year] 62 y.o.  Admit date: 10/07/2023 Discharge date: 10/19/2023  Discharge Diagnoses Fall from cart SDH/SAH Left temporal bone fracture Left TMJ fracture EtOH abuse T2DM HTN Hypothyroidism Hyponatremia, improving   Consultants Neurosurgery  ENT  Procedures None   HPI: Patient is a 62 year old male with past medical history T2DM, HTN, hypothyroidism as well as daily alcohol intake who was standing on a cart and staining a deck when he fell down striking his head. He was brought in by EMS and evaluated as a level 2 trauma. 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 Trauma consulted for admission.  Hospital Course: Neurosurgery consulted and recommended follow up [facility] in the AM vs sooner if worsening neuro status. ENT consulted and recommended ciprodex drops, IV steroids if no objection from neurosurgical standpoint. Follow up [facility] was stable. Patient required ICU care for precedex for acute agitation secondary to TBI with EtOH abuse at baseline. This was weaned off and patient transferred out of ICU 11/13. Patient with worsened hyponatremia and increased dizziness and emesis 11/14, started on salt tabs. Noted to have LUE/LLE proximal weakness 11/16 and then episode of unresponsiveness, remained hyponatremic and follow up [facility] showed continued right sided cerebral contusions with associated edema. Unresponsiveness resolved and patient was moving all extremities well later in the day. Transferred to the ICU for hypertonic saline per NS. Off hypertonic saline as of 11/18 and transferred to progressive care unit. Continued to struggle to maintain sodium level. Salt tabs increased to 3G TID and patient fluid restricted. Given lasix 11/21 and sodium was normalizing as of 11/22. Patient discharged to CIR in stable condition 11/22. Sodium  should be monitored and would recommend lasix as needed if low.   Follow up as outlined below upon discharge from inpatient rehab.     Follow-up Information     Center, [facility] .   Contact information: [address] [place] [address] [phone]         Coletta Memos, MD Follow up.   Specialty: Neurosurgery Why: As needed Contact information: [address] [place] [address] [phone]         Trixie Dredge., MD Follow up.   Specialty: Otolaryngology Why: As needed Contact information: [address], [address] [place] [address] [phone]                 Signed: Juliet Rude , [facility] Surgery 10/19/2023, 11:04 AM Please see Amion for pager number during day hours 7:00am-4:30pm

## 2023-10-19 NOTE — Plan of Care (Signed)
Problem: Education: Goal: Ability to describe self-care measures that may prevent or decrease complications (Diabetes Survival Skills Education) will improve 10/19/2023 1436 by Dannielle Burn, RN Outcome: Adequate for Discharge 10/19/2023 1136 by Dannielle Burn, RN Outcome: Progressing Goal: Individualized Educational Video(s) 10/19/2023 1436 by Dannielle Burn, RN Outcome: Adequate for Discharge 10/19/2023 1136 by Dannielle Burn, RN Outcome: Progressing   Problem: Coping: Goal: Ability to adjust to condition or change in health will improve 10/19/2023 1436 by Dannielle Burn, RN Outcome: Adequate for Discharge 10/19/2023 1136 by Dannielle Burn, RN Outcome: Progressing   Problem: Fluid Volume: Goal: Ability to maintain a balanced intake and output will improve 10/19/2023 1436 by Dannielle Burn, RN Outcome: Adequate for Discharge 10/19/2023 1136 by Dannielle Burn, RN Outcome: Progressing   Problem: Health Behavior/Discharge Planning: Goal: Ability to identify and utilize available resources and services will improve 10/19/2023 1436 by Dannielle Burn, RN Outcome: Adequate for Discharge 10/19/2023 1136 by Dannielle Burn, RN Outcome: Progressing Goal: Ability to manage health-related needs will improve 10/19/2023 1436 by Dannielle Burn, RN Outcome: Adequate for Discharge 10/19/2023 1136 by Dannielle Burn, RN Outcome: Progressing   Problem: Metabolic: Goal: Ability to maintain appropriate glucose levels will improve 10/19/2023 1436 by Dannielle Burn, RN Outcome: Adequate for Discharge 10/19/2023 1136 by Dannielle Burn, RN Outcome: Progressing   Problem: Nutritional: Goal: Maintenance of adequate nutrition will improve 10/19/2023 1436 by Dannielle Burn, RN Outcome: Adequate for Discharge 10/19/2023 1136 by Dannielle Burn, RN Outcome: Progressing Goal: Progress toward achieving an optimal weight will improve 10/19/2023 1436 by Dannielle Burn, RN Outcome: Adequate for Discharge 10/19/2023 1136 by Dannielle Burn, RN Outcome: Progressing   Problem: Skin Integrity: Goal: Risk for impaired skin integrity will decrease 10/19/2023 1436 by Dannielle Burn, RN Outcome: Adequate for Discharge 10/19/2023 1136 by Dannielle Burn, RN Outcome: Progressing   Problem: Tissue Perfusion: Goal: Adequacy of tissue perfusion will improve 10/19/2023 1436 by Dannielle Burn, RN Outcome: Adequate for Discharge 10/19/2023 1136 by Dannielle Burn, RN Outcome: Progressing   Problem: Education: Goal: Knowledge of General Education information will improve Description: Including pain rating scale, medication(s)/side effects and non-pharmacologic comfort measures 10/19/2023 1436 by Dannielle Burn, RN Outcome: Adequate for Discharge 10/19/2023 1136 by Dannielle Burn, RN Outcome: Progressing   Problem: Health Behavior/Discharge Planning: Goal: Ability to manage health-related needs will improve 10/19/2023 1436 by Dannielle Burn, RN Outcome: Adequate for Discharge 10/19/2023 1136 by Dannielle Burn, RN Outcome: Progressing   Problem: Clinical Measurements: Goal: Ability to maintain clinical measurements within normal limits will improve 10/19/2023 1436 by Dannielle Burn, RN Outcome: Adequate for Discharge 10/19/2023 1136 by Dannielle Burn, RN Outcome: Progressing Goal: Will remain free from infection 10/19/2023 1436 by Dannielle Burn, RN Outcome: Adequate for Discharge 10/19/2023 1136 by Dannielle Burn, RN Outcome: Progressing Goal: Diagnostic test results will improve 10/19/2023 1436 by Dannielle Burn, RN Outcome: Adequate for Discharge 10/19/2023 1136 by Dannielle Burn, RN Outcome: Progressing Goal: Respiratory complications will improve 10/19/2023 1436 by Dannielle Burn, RN Outcome: Adequate for Discharge 10/19/2023 1136 by Dannielle Burn, RN Outcome: Progressing Goal: Cardiovascular  complication will be avoided 10/19/2023 1436 by Dannielle Burn, RN Outcome: Adequate for Discharge 10/19/2023 1136 by Dannielle Burn, RN Outcome: Progressing   Problem: Activity: Goal: Risk for activity intolerance will decrease 10/19/2023 1436 by Dannielle Burn, RN Outcome: Adequate for Discharge  10/19/2023 1136 by Dannielle Burn, RN Outcome: Progressing   Problem: Nutrition: Goal: Adequate nutrition will be maintained 10/19/2023 1436 by Dannielle Burn, RN Outcome: Adequate for Discharge 10/19/2023 1136 by Dannielle Burn, RN Outcome: Progressing   Problem: Coping: Goal: Level of anxiety will decrease 10/19/2023 1436 by Dannielle Burn, RN Outcome: Adequate for Discharge 10/19/2023 1136 by Dannielle Burn, RN Outcome: Progressing   Problem: Elimination: Goal: Will not experience complications related to bowel motility 10/19/2023 1436 by Dannielle Burn, RN Outcome: Adequate for Discharge 10/19/2023 1136 by Dannielle Burn, RN Outcome: Progressing Goal: Will not experience complications related to urinary retention 10/19/2023 1436 by Dannielle Burn, RN Outcome: Adequate for Discharge 10/19/2023 1136 by Dannielle Burn, RN Outcome: Progressing   Problem: Pain Management: Goal: General experience of comfort will improve 10/19/2023 1436 by Dannielle Burn, RN Outcome: Adequate for Discharge 10/19/2023 1136 by Dannielle Burn, RN Outcome: Progressing   Problem: Safety: Goal: Ability to remain free from injury will improve 10/19/2023 1436 by Dannielle Burn, RN Outcome: Adequate for Discharge 10/19/2023 1136 by Dannielle Burn, RN Outcome: Progressing   Problem: Skin Integrity: Goal: Risk for impaired skin integrity will decrease 10/19/2023 1436 by Dannielle Burn, RN Outcome: Adequate for Discharge 10/19/2023 1136 by Dannielle Burn, RN Outcome: Progressing   Problem: Education: Goal: Knowledge of the prescribed therapeutic  regimen 10/19/2023 1436 by Dannielle Burn, RN Outcome: Adequate for Discharge 10/19/2023 1136 by Dannielle Burn, RN Outcome: Progressing Goal: Knowledge of disease or condition will improve 10/19/2023 1436 by Dannielle Burn, RN Outcome: Adequate for Discharge 10/19/2023 1136 by Dannielle Burn, RN Outcome: Progressing   Problem: Clinical Measurements: Goal: Neurologic status will improve 10/19/2023 1436 by Dannielle Burn, RN Outcome: Adequate for Discharge 10/19/2023 1136 by Dannielle Burn, RN Outcome: Progressing   Problem: Tissue Perfusion: Goal: Ability to maintain intracranial pressure will improve 10/19/2023 1436 by Dannielle Burn, RN Outcome: Adequate for Discharge 10/19/2023 1136 by Dannielle Burn, RN Outcome: Progressing   Problem: Respiratory: Goal: Will regain and/or maintain adequate ventilation 10/19/2023 1436 by Dannielle Burn, RN Outcome: Adequate for Discharge 10/19/2023 1136 by Dannielle Burn, RN Outcome: Progressing   Problem: Skin Integrity: Goal: Risk for impaired skin integrity will decrease 10/19/2023 1436 by Dannielle Burn, RN Outcome: Adequate for Discharge 10/19/2023 1136 by Dannielle Burn, RN Outcome: Progressing Goal: Demonstration of wound healing without infection will improve 10/19/2023 1436 by Dannielle Burn, RN Outcome: Adequate for Discharge 10/19/2023 1136 by Dannielle Burn, RN Outcome: Progressing   Problem: Psychosocial: Goal: Ability to verbalize positive feelings about self will improve 10/19/2023 1436 by Dannielle Burn, RN Outcome: Adequate for Discharge 10/19/2023 1136 by Dannielle Burn, RN Outcome: Progressing Goal: Ability to participate in self-care as condition permits will improve 10/19/2023 1436 by Dannielle Burn, RN Outcome: Adequate for Discharge 10/19/2023 1136 by Dannielle Burn, RN Outcome: Progressing Goal: Ability to identify appropriate support needs will improve 10/19/2023  1436 by Dannielle Burn, RN Outcome: Adequate for Discharge 10/19/2023 1136 by Dannielle Burn, RN Outcome: Progressing   Problem: Health Behavior/Discharge Planning: Goal: Ability to manage health-related needs will improve 10/19/2023 1436 by Dannielle Burn, RN Outcome: Adequate for Discharge 10/19/2023 1136 by Dannielle Burn, RN Outcome: Progressing   Problem: Nutritional: Goal: Risk of aspiration will decrease 10/19/2023 1436 by Dannielle Burn, RN Outcome: Adequate for Discharge 10/19/2023 1136 by Larey Days  H, RN Outcome: Progressing Goal: Dietary intake will improve 10/19/2023 1436 by Dannielle Burn, RN Outcome: Adequate for Discharge 10/19/2023 1136 by Dannielle Burn, RN Outcome: Progressing   Problem: Communication: Goal: Ability to communicate needs accurately will improve 10/19/2023 1436 by Dannielle Burn, RN Outcome: Adequate for Discharge 10/19/2023 1136 by Dannielle Burn, RN Outcome: Progressing

## 2023-10-19 NOTE — Progress Notes (Signed)
Nutrition Follow-up  DOCUMENTATION CODES:   Non-severe (moderate) malnutrition in context of chronic illness  INTERVENTION:  -Continue regular diet, and promote PO intake   -Recommend Ensure Enlive po BID, each supplement provides 350 kcal and 20 grams of protein.   -Continue folic acid 1 mg daily and MVI/Minerals-1 Tab daily for micronutrient support with hx of ETOH use.  - Provide snacks   - If continued poor PO intake, may want to consider Cortrak placement  NUTRITION DIAGNOSIS:   Moderate Malnutrition related to chronic illness (uncontrolled DM and ETOH use) as evidenced by moderate muscle depletion, moderate fat depletion.   GOAL:   Patient will meet greater than or equal to 90% of their needs   MONITOR:   PO intake, Weight trends, Supplement acceptance, Skin, Labs, Other (Comment) (need for nutrition support, tube feeding)  REASON FOR ASSESSMENT:   Consult Enteral/tube feeding initiation and management  ASSESSMENT:   Pt with PMH of DM, HLD, smoker, and daily ETOH use admitted after fall with TBI, SDH, SAH, L temporal bone fx, and L TMJ fx.  11/11-diet advanced to regular 11/12-Cortrak tube removed 11/15-changes in am neur status, and head CT, patient to transfer to neuro PCU 11/22 - Pending placement to CIR   - Pt to go to CIR today.  - Pt states he is lactose intolerant.  Family at bedside, pt seems to be a poor historian. Pt states he does not like American style foods. Pt has not had much of an appetite since being admitted. Family has been bringing in meals. No meals documented in the past 2 days. Pt consumed around 75% of his meals on 11/18 and 75% of breakfast on 11/20.   Yesterday family states pt did not eat breakfast or lunch and only ate a couple bites of soup that they prepared along with an apple and some peanut butter for dinner. This morning he had a hash brown and bites of a [employer] breakfast sandwich. Pt states he only drank 1 ensure  yesterday and he has not been drinking them regularly because they are too sweet.   Offered different supplements to pt but was unwilling to try because they were all too sweet. Pt was amendable to snacks. Encouraged family to bring in meals, pt to drink his Ensures and increase his PO intake.   Pt states his usual body weight to be around 160 lbs 4-5 years ago. Per weight documentation he was 154 lbs 2 years ago. Pt currently weighs 131 lbs, pt and family concerned with this new weight.    If continued poor PO intake, recommend Cortrak placement due to inconsistent PO intake and continued weight loss.   Admit weight: 61.2 kg  Current weight: 59.6 kg   Average Meal Intake: 11/18-11/20: 62% intake x 4 recorded meals  Nutritionally Relevant Medications: Scheduled Meds:  docusate sodium  100 mg Oral BID   folic acid  1 mg Oral Daily   insulin aspart  0-9 Units Subcutaneous Q4H   insulin glargine-yfgn  10 Units Subcutaneous Daily   multivitamin with minerals  1 tablet Oral Daily   polyethylene glycol  17 g Oral BID   sodium chloride  3 g Oral TID WC   urea  15 g Oral BID   Labs Reviewed: Sodium 132, Chloride 97, BUN 25 CBG ranges from 104-240 mg/dL over the last 24 hours HgbA1c 8.4  Diet Order:   Diet Order             Diet  regular Room service appropriate? Yes with Assist; Fluid consistency: Thin; Fluid restriction: 1200 mL Fluid  Diet effective now                   EDUCATION NEEDS:   Not appropriate for education at this time  Skin:  Skin Assessment: Skin Integrity Issues: Skin Integrity Issues:: Other (Comment) Other: left arm abrasion  Last BM:  10/17/23:  Height:   Ht Readings from Last 1 Encounters:  10/07/23 5\' 8"  (1.727 m)    Weight:   Wt Readings from Last 1 Encounters:  10/18/23 59.6 kg    Ideal Body Weight:     BMI:  Body mass index is 19.98 kg/m.  Estimated Nutritional Needs:   Kcal:  1800-2000  Protein:  80-100 gm  Fluid:   >1.8L  Elliot Dally, RD Registered Dietitian  See Amion for more information

## 2023-10-19 NOTE — Progress Notes (Signed)
Inpatient Rehab Admissions Coordinator:    I have a CIR bed for this Pt. Today. RN may call report to [phone].   Pt. To admit to CIR for an estimated 7-10 days with the goal of reaching supervision level and returning home with support from wife and daughter.   Megan Salon, MS, CCC-SLP Rehab Admissions Coordinator  [phone] (celll) [phone] (office)

## 2023-10-20 DIAGNOSIS — S069X9S Unspecified intracranial injury with loss of consciousness of unspecified duration, sequela: Secondary | ICD-10-CM | POA: Diagnosis not present

## 2023-10-20 LAB — CBC WITH DIFFERENTIAL/PLATELET
Abs Immature Granulocytes: 0.04 10*3/uL (ref 0.00–0.07)
Basophils Absolute: 0.1 10*3/uL (ref 0.0–0.1)
Basophils Relative: 1 %
Eosinophils Absolute: 0.3 10*3/uL (ref 0.0–0.5)
Eosinophils Relative: 3 %
HCT: 35 % — ABNORMAL LOW (ref 39.0–52.0)
Hemoglobin: 12.6 g/dL — ABNORMAL LOW (ref 13.0–17.0)
Immature Granulocytes: 0 %
Lymphocytes Relative: 29 %
Lymphs Abs: 2.8 10*3/uL (ref 0.7–4.0)
MCH: 32.2 pg (ref 26.0–34.0)
MCHC: 36 g/dL (ref 30.0–36.0)
MCV: 89.5 fL (ref 80.0–100.0)
Monocytes Absolute: 0.8 10*3/uL (ref 0.1–1.0)
Monocytes Relative: 9 %
Neutro Abs: 5.5 10*3/uL (ref 1.7–7.7)
Neutrophils Relative %: 58 %
Platelets: 396 10*3/uL (ref 150–400)
RBC: 3.91 MIL/uL — ABNORMAL LOW (ref 4.22–5.81)
RDW: 12 % (ref 11.5–15.5)
WBC: 9.5 10*3/uL (ref 4.0–10.5)
nRBC: 0 % (ref 0.0–0.2)

## 2023-10-20 LAB — GLUCOSE, CAPILLARY
Glucose-Capillary: 121 mg/dL — ABNORMAL HIGH (ref 70–99)
Glucose-Capillary: 183 mg/dL — ABNORMAL HIGH (ref 70–99)
Glucose-Capillary: 211 mg/dL — ABNORMAL HIGH (ref 70–99)
Glucose-Capillary: 229 mg/dL — ABNORMAL HIGH (ref 70–99)

## 2023-10-20 LAB — COMPREHENSIVE METABOLIC PANEL
ALT: 133 U/L — ABNORMAL HIGH (ref 0–44)
AST: 73 U/L — ABNORMAL HIGH (ref 15–41)
Albumin: 3.3 g/dL — ABNORMAL LOW (ref 3.5–5.0)
Alkaline Phosphatase: 160 U/L — ABNORMAL HIGH (ref 38–126)
Anion gap: 9 (ref 5–15)
BUN: 17 mg/dL (ref 6–20)
CO2: 25 mmol/L (ref 22–32)
Calcium: 9.1 mg/dL (ref 8.9–10.3)
Chloride: 99 mmol/L (ref 98–111)
Creatinine, Ser: 0.76 mg/dL (ref 0.61–1.24)
GFR, Estimated: >60 mL/min (ref >60)
Glucose, Bld: 119 mg/dL — ABNORMAL HIGH (ref 70–99)
Potassium: 4.1 mmol/L (ref 3.5–5.1)
Sodium: 133 mmol/L — ABNORMAL LOW (ref 135–145)
Total Bilirubin: 0.8 mg/dL (ref <1.2)
Total Protein: 6.6 g/dL (ref 6.5–8.1)

## 2023-10-20 MED ORDER — ACETAMINOPHEN 500 MG PO TABS
1000.0000 mg | ORAL_TABLET | Freq: Three times a day (TID) | ORAL | Status: DC
Start: 2023-10-20 — End: 2023-10-25
  Administered 2023-10-20 – 2023-10-25 (×14): 1000 mg via ORAL
  Filled 2023-10-20 (×14): qty 2

## 2023-10-20 MED ORDER — SENNOSIDES-DOCUSATE SODIUM 8.6-50 MG PO TABS
1.0000 | ORAL_TABLET | Freq: Two times a day (BID) | ORAL | Status: DC
  Administered 2023-10-20 – 2023-10-24 (×9): 1 via ORAL
  Filled 2023-10-20 (×10): qty 1

## 2023-10-20 MED ORDER — TOPIRAMATE 25 MG PO TABS
25.0000 mg | ORAL_TABLET | Freq: Two times a day (BID) | ORAL | Status: DC
  Administered 2023-10-20 – 2023-10-21 (×4): 25 mg via ORAL
  Filled 2023-10-20 (×5): qty 1

## 2023-10-20 NOTE — Evaluation (Signed)
Physical Therapy Assessment and Plan  Patient Details  Name: Charles Dorsey MRN: [id number] Date of Birth: Apr 17, 1961  PT Diagnosis: Abnormality of gait, Cognitive deficits, Coordination disorder, Difficulty walking, Impaired cognition, Muscle weakness, and Pain in headache Rehab Potential: Good ELOS: 10-12 days   Today's Date: 10/20/2023 PT Individual Time: 0865-7846 PT Individual Time Calculation (min): 76 min    Hospital Problem: Principal Problem:   TBI (traumatic brain injury) (HCC)   Past Medical History:  Past Medical History:  Diagnosis Date   Acid reflux    CAD (coronary artery disease)    Diabetes mellitus without complication (HCC)    Hyperlipidemia    Hypothyroidism    Insomnia    Thyroid nodule    Past Surgical History:  Past Surgical History:  Procedure Laterality Date   CORONARY STENT INTERVENTION N/A 03/18/2020   Procedure: CORONARY STENT INTERVENTION;  Surgeon: Elder Negus, MD;  Location: [facility];  Service: Cardiovascular;  Laterality: N/A;   CORONARY ULTRASOUND/IVUS N/A 03/18/2020   Procedure: Intravascular Ultrasound/IVUS;  Surgeon: Elder Negus, MD;  Location: [facility];  Service: Cardiovascular;  Laterality: N/A;   LEFT HEART CATH AND CORONARY ANGIOGRAPHY N/A 03/18/2020   Procedure: LEFT HEART CATH AND CORONARY ANGIOGRAPHY;  Surgeon: Elder Negus, MD;  Location: [facility];  Service: Cardiovascular;  Laterality: N/A;    Assessment & Plan Clinical Impression: Patient is a 62 y.o. year old [place] male of [place] descent with history of T2DM, CAD who was admitted on 10/07/23 after falling 10 feet off a ladder on a chart while trying to paint his carport.  ETOH level 62 at admission. He struck his head, has amnesia of events, was noted to have bleeding from left ear and was nauseous enroute to ED. He was found to have SAH and SDH along right cerebral convexity, SDH extending along right tentorial leaflet and  posterior falx, longitudinal fracture left temporal bone with blood products left mastoid air cell and middle ear with fracture extending into left parietal bone and let TMJ as well as incidental finding of right disc bulge C5/C6 with moderate spinal canal narrowing.  Ct abdomen/pelvis showed diffuse mucosal enhancement of colon with hyperemia favored to be nonspecific infectious or inflammatory, enlarged/heterogenous thyroid gland and multiple pulmonary nodules 5 mm (no follow up recommended). ENT/Dr.Caldwell consulted and recommended Ciprodex added to left ear canal and defer TMJ management to oral surgery. Surgical decompression if patient losses movement of left facial nerve.   Dr. Franky Macho recommended monitoring with serial CT head which showed "blossoming contusions right frontal and right temporal lobe"     He had sudden onset of unresponsiveness on 11/16 with CT head showing continued contusions with edema felt to be due to progressive hyponatremia and TBI. Hypertonic saline added and Keppra resumed by Dr. Jordan Likes. He had improvement in mentation with increased ability to follow commands and hypertonic saline weaned off by 11/18 with improvement. Sodium started trending down again and Urea added 11/20 with increase in sodium chloride tabs to 3 mg TID.  Sodium trended down to 128 on 11/21 which was treated with dose of lasix and has improved to 132. Therapy has been working with patient who continues to be limited by pain, dizziness question due to vestibular issues, unsteady and scissoring gait requiring min assist.  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 CIR recommended due to functional decline. .  Patient transferred to CIR on 10/19/2023 .  Patient currently requires min with mobility secondary to muscle weakness, decreased cardiorespiratoy endurance, impaired timing and sequencing and unbalanced muscle activation, decreased awareness, decreased problem solving, decreased safety  awareness, and decreased memory, potential vestibular involvement (to be further evaluated), and decreased standing balance, decreased postural control, and decreased balance strategies.  Prior to hospitalization, patient was independent  with mobility and lived with Spouse, Daughter (23y.o. daughter) in a House home.  Home access is One step down after walking into the house into kitchen and bedroomStairs to enter.  Patient will benefit from skilled PT intervention to maximize safe functional mobility, minimize fall risk, and decrease caregiver burden for planned discharge home with intermittent assist.  Anticipate patient will benefit from follow up OP at discharge.  PT - End of Session Activity Tolerance: Tolerates 30+ min activity with multiple rests Endurance Deficit: Yes Endurance Deficit Description: requires rest breaks and experiencing orthostatic hypotension PT Assessment Rehab Potential (ACUTE/IP ONLY): Good PT Barriers to Discharge: Decreased caregiver support;Home environment access/layout;Lack of/limited family support PT Patient demonstrates impairments in the following area(s): Balance;Pain;Safety;Endurance;Motor;Nutrition PT Transfers Functional Problem(s): Bed Mobility;Bed to Chair;Car;Furniture;Floor PT Locomotion Functional Problem(s): Ambulation;Stairs PT Plan PT Intensity: Minimum of 1-2 x/day ,45 to 90 minutes PT Frequency: 5 out of 7 days PT Duration Estimated Length of Stay: 10-12 days PT Treatment/Interventions: Ambulation/gait training;Cognitive remediation/compensation;Discharge planning;DME/adaptive equipment instruction;Functional mobility training;Pain management;Psychosocial support;Therapeutic Activities;UE/LE Strength taining/ROM;Balance/vestibular training;Community reintegration;Disease management/prevention;Neuromuscular re-education;Patient/family education;Stair training;Therapeutic Exercise;UE/LE Coordination activities PT Transfers Anticipated Outcome(s):  mod-I using LRAD PT Locomotion Anticipated Outcome(s): supervision using LRAD PT Recommendation Recommendations for Other Services: Therapeutic Recreation consult Therapeutic Recreation Interventions: Outing/community reintergration;Stress management Follow Up Recommendations: Outpatient PT;24 hour supervision/assistance Patient destination: Home Equipment Recommended: To be determined   PT Evaluation Precautions/Restrictions Precautions Precautions: Fall Restrictions Weight Bearing Restrictions: No Pain Pain Assessment Pain Scale: 0-10 Pain Score: 7  Pain Type: Acute pain Pain Location: Head Pain Orientation: Right;Anterior Pain Descriptors / Indicators: Headache Pain Onset: On-going Pain Intervention(s): Medication (See eMAR);Relaxation;Rest;Distraction;Emotional support Pain Interference Pain Interference Pain Effect on Sleep: 3. Frequently Pain Interference with Therapy Activities: 1. Rarely or not at all Pain Interference with Day-to-Day Activities: 1. Rarely or not at all Home Living/Prior Functioning Home Living Available Help at Discharge: Family;Available PRN/intermittently (wife & DTR both work full time) Type of Home: House Home Access: Stairs to enter [employer] of Steps: One step down after walking into the house into kitchen and bedroom Home Layout: Two level;Able to live on main level with bedroom/bathroom (split level) Alternate Level Stairs-Number of Steps: 5 or 7 Alternate Level Stairs-Rails: Right Bathroom Shower/Tub: Tub/shower unit;Curtain;Walk-in shower (need to clarify) Bathroom Toilet: Standard Bathroom Accessibility: Yes Additional Comments: Need to clarify home set-up  Lives With: Spouse;Daughter (23y.o. daughter) Prior Function Level of Independence: Independent with homemaking with ambulation;Independent with basic ADLs;Independent with transfers;Independent with gait  Able to Take Stairs?: Yes Driving: Yes Vocation: Full time  employment Vocation Requirements: custodian Leisure: Hobbies-yes (Comment) Vision/Perception  Vision - History Ability to See in Adequate Light: 0 Adequate Perception Perception: Within Functional Limits Praxis Praxis: WFL  Cognition Overall Cognitive Status: Impaired/Different from baseline Arousal/Alertness: Awake/alert Orientation Level: Oriented X4 Year: 2024 Month: November Day of Week: Correct Attention: Focused;Sustained;Selective Focused Attention: Appears intact Sustained Attention: Appears intact Selective Attention: Impaired Memory: Impaired Awareness: Impaired Problem Solving: Impaired Behaviors: Impulsive Safety/Judgment: Impaired [employer] Scales of Cognitive Functioning: Confused, Appropriate: Moderate Assistance Sensation Sensation Light Touch: Appears Intact Hot/Cold: Not tested Proprioception: Impaired by gross assessment Stereognosis: Not tested Coordination Gross  Motor Movements are Fluid and Coordinated: No Coordination and Movement Description: impaired due to imbalance and incoordination Motor  Motor Motor: Other (comment) Motor - Skilled Clinical Observations: impaired due to deconditioning and balance deficits with incoordination   Trunk/Postural Assessment  Cervical Assessment Cervical Assessment: Within Functional Limits Thoracic Assessment Thoracic Assessment: Within Functional Limits Lumbar Assessment Lumbar Assessment: Within Functional Limits Postural Control Postural Control: Deficits on evaluation Righting Reactions: delayed Postural Limitations: decreased  Balance Balance Balance Assessed: Yes Static Sitting Balance Static Sitting - Balance Support: Feet supported Static Sitting - Level of Assistance: 5: Stand by assistance Dynamic Sitting Balance Dynamic Sitting - Balance Support: Feet supported Dynamic Sitting - Level of Assistance: Other (comment);4: Min assist (CGA) Static Standing Balance Static Standing -  Balance Support: During functional activity Static Standing - Level of Assistance: Other (comment);4: Min assist (CGA) Dynamic Standing Balance Dynamic Standing - Balance Support: During functional activity Dynamic Standing - Level of Assistance: 4: Min assist Extremity Assessment  RLE Assessment RLE Assessment: Exceptions to [facility] Active Range of Motion (AROM) Comments: WFL/WNL General Strength Comments: assessed sitting in w/c RLE Strength Right Hip Flexion: 4-/5 Right Knee Flexion: 5/5 Right Knee Extension: 5/5 Right Ankle Dorsiflexion: 5/5 Right Ankle Plantar Flexion: 5/5 LLE Assessment LLE Assessment: Exceptions to [facility] Active Range of Motion (AROM) Comments: WFL/WNL General Strength Comments: assessed sitting in w/c LLE Strength Left Hip Flexion: 4-/5 Left Knee Flexion: 4+/5 Left Knee Extension: 5/5 Left Ankle Dorsiflexion: 5/5 Left Ankle Plantar Flexion: 5/5  Care Tool Care Tool Bed Mobility Roll left and right activity   Roll left and right assist level: Independent with assistive device    Sit to lying activity   Sit to lying assist level: Contact Guard/Touching assist    Lying to sitting on side of bed activity   Lying to sitting on side of bed assist level: the ability to move from lying on the back to sitting on the side of the bed with no back support.: Contact Guard/Touching assist     Care Tool Transfers Sit to stand transfer   Sit to stand assist level: Minimal Assistance - Patient > 75%    Chair/bed transfer   Chair/bed transfer assist level: Minimal Assistance - Patient > 75%    Car transfer   Car transfer assist level: Minimal Assistance - Patient > 75%      Care Tool Locomotion Ambulation   Assist level: Minimal Assistance - Patient > 75% Assistive device: No Device Max distance: 69ft  Walk 10 feet activity   Assist level: Minimal Assistance - Patient > 75% Assistive device: No Device   Walk 50 feet with 2 turns activity   Assist level:  Minimal Assistance - Patient > 75% Assistive device: No Device  Walk 150 feet activity Walk 150 feet activity did not occur: Safety/medical concerns      Walk 10 feet on uneven surfaces activity Walk 10 feet on uneven surfaces activity did not occur: Safety/medical concerns      Stairs   Assist level: Minimal Assistance - Patient > 75% Stairs assistive device: 2 hand rails Max number of stairs: 4  Walk up/down 1 step activity   Walk up/down 1 step (curb) assist level: Minimal Assistance - Patient > 75% Walk up/down 1 step or curb assistive device: 2 hand rails  Walk up/down 4 steps activity   Walk up/down 4 steps assist level: Minimal Assistance - Patient > 75% Walk up/down 4 steps assistive device: 2 hand rails  Walk up/down  12 steps activity Walk up/down 12 steps activity did not occur: Safety/medical concerns      Pick up small objects from floor   Pick up small object from the floor assist level: Moderate Assistance - Patient 50 - 74% Pick up small object from the floor assistive device: none  Wheelchair Is the patient using a wheelchair?: Yes Type of Wheelchair: Manual   Wheelchair assist level: Dependent - Patient 0%    Wheel 50 feet with 2 turns activity   Assist Level: Dependent - Patient 0%  Wheel 150 feet activity   Assist Level: Dependent - Patient 0%    Refer to Care Plan for Long Term Goals  SHORT TERM GOAL WEEK 1 PT Short Term Goal 1 (Week 1): Pt will perform supine<>sit mod-I PT Short Term Goal 2 (Week 1): Pt will perform bed<>chair transfers using LRAD with supervision PT Short Term Goal 3 (Week 1): Pt will ambulate at least 147ft using LRAD with CGA PT Short Term Goal 4 (Week 1): Pt will navigate at least 8 steps using HRs with CGA PT Short Term Goal 5 (Week 1): Pt will participate in standardized outcome measure to asses fall risk  Recommendations for other services: Therapeutic Recreation  Stress management and Outing/community reintegration  Skilled  Therapeutic Intervention Pt received supine in bed with his wife present and pt agreeable to therapy session. Evaluation completed (see details above) with patient education regarding purpose of PT evaluation, PT POC and goals, therapy schedule, weekly team meetings, and other CIR information including safety plan and fall risk safety. Pt performed the below functional mobility tasks with the specified levels of skilled cuing and assistance. Pt reports immediate need to go to bathroom upon therapist arrival. Pt with fatigue requiring frequent rest breaks after each mobility task during session  Orthostatic vitals: Supine: BP 136/54 (MAP 79), HR 78bpm  Sitting: BP 119/65 (MAP 80), HR 75bpm, dizziness rated as 6/10 Standing: BP 108/57 (MAP 73), HR 75bpm, reports increase in dizziness  Sitting in gym: BP 131/58 (MAP 74), HR 75bpm  In standing, after 1ft gait: BP 92/54 (MAP 65), HR 76bpm, symptomatic Reassessed after sitting ~70min: BP 92/50 (MAP 65), HR 72bpm; therefore, retrieved thigh high TEDs and water After sitting another ~30min & donning TEDs: BP 129/58 (MAP 77), HR 72bpm   Needed to sit immediately following 4 stair navigation due to feeling dizzy.  Vitals in sitting: BP 118/57 (MAP 75), HR 69bpm with thigh high TEDs  MD made aware of pt's orthostatic hypotension. Pt will need a smaller size of TED hose. Educated pt/wife to wear TED hose when upright, OOB and/or participating in therapy, but to doff when resting in the bed. Educated pt and wife on ensuring adequate water intake daily (recommending 6 cups of water).  When coming to stand, pt often remains in slight crouch posture until starting to move, despite demonstrating adequate strength during muscle testing to stand fully upright. During gait, pt with R and L lateral balance instability with increased path deviation and delayed awareness of minor LOB.  At end of session, pt left L sidelying in bed with needs in reach, bed alarm on, and  his wife present.   Mobility Bed Mobility Bed Mobility: Sit to Supine;Supine to Sit Supine to Sit: Contact Guard/Touching assist Sit to Supine: Contact Guard/Touching assist Transfers Transfers: Sit to Stand;Stand to Sit;Stand Pivot Transfers Sit to Stand: Minimal Assistance - Patient > 75% Stand to Sit: Minimal Assistance - Patient > 75% Stand Pivot Transfers:  Minimal Assistance - Patient > 75% Stand Pivot Transfer Details: Manual facilitation for weight shifting;Verbal cues for sequencing;Verbal cues for technique;Verbal cues for precautions/safety;Tactile cues for weight shifting;Tactile cues for posture Stand Pivot Transfer Details (indicate cue type and reason): manual facilitation for balance due to R/L lateral leaning Transfer (Assistive device): 1 person hand held assist Locomotion  Gait Ambulation: Yes Gait Assistance: Minimal Assistance - Patient > 75% Gait Distance (Feet): 73 Feet (x2; distance limited 2/2 orthostatic hypotension) Assistive device: None Gait Assistance Details: Manual facilitation for weight shifting;Verbal cues for gait pattern;Verbal cues for technique;Verbal cues for precautions/safety;Tactile cues for weight shifting Gait Gait: Yes Gait Pattern: Impaired Gait Pattern: Step-through pattern (R and L lateral postural instability with delayed awareness of balance deficits) Stairs / Additional Locomotion Stairs: Yes Stairs Assistance: Minimal Assistance - Patient > 75% Stair Management Technique: Two rails;Alternating pattern;Step to pattern;Forwards (alternating on ascent adn step-to descent) Number of Stairs: 4 Height of Stairs: 6 Wheelchair Mobility Wheelchair Mobility: No   Discharge Criteria: Patient will be discharged from PT if patient refuses treatment 3 consecutive times without medical reason, if treatment goals not met, if there is a change in medical status, if patient makes no progress towards goals or if patient is discharged from  hospital.  The above assessment, treatment plan, treatment alternatives and goals were discussed and mutually agreed upon: by patient and by family  Ginny Forth , PT, DPT, NCS, CSRS 10/20/2023, 2:09 PM

## 2023-10-20 NOTE — Evaluation (Signed)
Occupational Therapy Assessment and Plan  Patient Details  Name: Charles Dorsey MRN: [id number] Date of Birth: May 17, 1961  OT Diagnosis: cognitive deficits and muscle weakness (generalized) Rehab Potential: Rehab Potential (ACUTE ONLY): Excellent ELOS: 10-12 days   Today's Date: 10/20/2023 OT Individual Time: 1047-1200 OT Individual Time Calculation (min): 73 min     Hospital Problem: Principal Problem:   TBI (traumatic brain injury) (HCC)   Past Medical History:  Past Medical History:  Diagnosis Date   Acid reflux    CAD (coronary artery disease)    Diabetes mellitus without complication (HCC)    Hyperlipidemia    Hypothyroidism    Insomnia    Thyroid nodule    Past Surgical History:  Past Surgical History:  Procedure Laterality Date   CORONARY STENT INTERVENTION N/A 03/18/2020   Procedure: CORONARY STENT INTERVENTION;  Surgeon: Charles Negus, MD;  Location: [facility];  Service: Cardiovascular;  Laterality: N/A;   CORONARY ULTRASOUND/IVUS N/A 03/18/2020   Procedure: Intravascular Ultrasound/IVUS;  Surgeon: Charles Negus, MD;  Location: [facility];  Service: Cardiovascular;  Laterality: N/A;   LEFT HEART CATH AND CORONARY ANGIOGRAPHY N/A 03/18/2020   Procedure: LEFT HEART CATH AND CORONARY ANGIOGRAPHY;  Surgeon: Charles Negus, MD;  Location: [facility];  Service: Cardiovascular;  Laterality: N/A;    Assessment & Plan Clinical Impression: Patient is a 62 y.o. year old male with recent admission to the hospital on 11/10 with fall from cart while staining a deck, +ETOH. Pt sustained traumatic R SAH, R SDH, L temporal bone fx, L TMJ fx. Head CT 11/16 slightly increased size of hemorrhagic contusions in the R frontal and temporal lobes, and slightly increased L midline shift now 7mm; transfer to ICU 11/16 for hypertonic saline. PMH includes DMII, HLD, coronary stent 2021. Patient transferred to CIR on 10/19/2023 .    Patient currently  requires min with basic self-care skills secondary to muscle weakness, decreased cardiorespiratoy endurance, and decreased sitting balance, decreased standing balance, and decreased balance strategies.  Prior to hospitalization, patient could complete BADL with min.  Patient will benefit from skilled intervention to increase independence with basic self-care skills prior to discharge home with care partner.  Anticipate patient will require 24 hour supervision and follow up outpatient.  OT - End of Session Activity Tolerance: Tolerates < 10 min activity with changes in vital signs Endurance Deficit: Yes Endurance Deficit Description: Rest breaks within BADL tass OT Assessment Rehab Potential (ACUTE ONLY): Excellent OT Patient demonstrates impairments in the following area(s): Balance;Cognition;Endurance;Motor;Safety OT Basic ADL's Functional Problem(s): Grooming;Bathing;Dressing;Toileting OT Transfers Functional Problem(s): Toilet;Tub/Shower OT Additional Impairment(s): Fuctional Use of Upper Extremity OT Plan OT Intensity: Minimum of 1-2 x/day, 45 to 90 minutes OT Frequency: 5 out of 7 days OT Duration/Estimated Length of Stay: 10-12 days OT Treatment/Interventions: Balance/vestibular training;Cognitive remediation/compensation;Community reintegration;Discharge planning;Disease mangement/prevention;DME/adaptive equipment instruction;Functional electrical stimulation;Functional mobility training;Neuromuscular re-education;Pain management;Patient/family education;Psychosocial support;Self Care/advanced ADL retraining;Skin care/wound managment;Splinting/orthotics;Therapeutic Activities;Therapeutic Exercise;UE/LE Strength taining/ROM;UE/LE Coordination activities;Visual/perceptual remediation/compensation;Wheelchair propulsion/positioning OT Self Feeding Anticipated Outcome(s): Mod I OT Basic Self-Care Anticipated Outcome(s): Mod I/supervision OT Toileting Anticipated Outcome(s): Supervisoin OT  Bathroom Transfers Anticipated Outcome(s): Supervision. OT Recommendation Patient destination: Home Follow Up Recommendations: Outpatient OT Equipment Recommended: To be determined Equipment Details: TBD   OT Evaluation Precautions/Restrictions  Precautions Precautions: Fall Restrictions Weight Bearing Restrictions: No General   Vital Signs   Pain Pain Assessment Pain Scale: 0-10 Pain Score: 8  Faces Pain Scale: No hurt Pain Type: Acute pain Pain Location:  Head Pain Orientation: Right;Anterior Pain Descriptors / Indicators: Constant Pain Frequency: Constant Pain Onset: On-going Patients Stated Pain Goal: 2 Pain Intervention(s): Medication (See eMAR) Home Living/Prior Functioning Home Living Available Help at Discharge: Available PRN/intermittently Type of Home: House Home Access: Stairs to enter [employer] of Steps: One step down after walking into the house Home Layout: Two level, Able to live on main level with bedroom/bathroom Bathroom Shower/Tub: Tub/shower unit, Curtain, Walk-in shower (need to clarify) Bathroom Toilet: Standard Bathroom Accessibility: Yes Additional Comments: Need to clarify home set-up  Lives With: Spouse, Family IADL History Homemaking Responsibilities: No Current License: Yes Occupation: Full time employment Type of Occupation: Custodian at [employer] 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 Perception  Perception: Within Functional Limits Praxis Praxis:  WFL Cognition Cognition Overall Cognitive Status: Impaired/Different from baseline Arousal/Alertness: Awake/alert Orientation Level: Person;Situation;Place Person: Oriented Place: Oriented Situation: Oriented Memory: Impaired Memory Impairment: Retrieval deficit Attention: Sustained;Selective Sustained Attention: Appears intact Selective Attention: Impaired Awareness: Impaired Behaviors: Impulsive Safety/Judgment: Impaired Rancho Mirant Scales of Cognitive Functioning: Confused, Appropriate: Moderate Assistance Brief Interview for Mental Status (BIMS) Repetition of Three Words (First Attempt): 3 Temporal Orientation: Year: Correct Temporal Orientation: Month: Accurate within 5 days Temporal Orientation: Day: Correct Recall: "Sock": Yes, no cue required Recall: "Blue": Yes, no cue required Recall: "Bed": Yes, no cue required BIMS Summary Score: 15 Sensation Sensation Light Touch: Appears Intact Coordination Fine Motor Movements are Fluid and Coordinated: No Finger Nose Finger Test: decreased smoothness and accuracy 9 Hole Peg Test: R: 44 seconds, L: 43 (easily distracted) Customer service manager - Skilled Clinical Observations: generalized weakness  Trunk/Postural Assessment     Balance Balance Balance Assessed: Yes Static Sitting Balance Static Sitting - Balance Support: Feet supported Static Sitting - Level of Assistance: 5: Stand by assistance Dynamic Sitting Balance Dynamic Sitting - Balance Support: Feet supported Dynamic Sitting - Level of Assistance: 5: Stand by assistance;4: Min assist (CGA) Static Standing Balance Static Standing - Balance Support: During functional activity Static Standing - Level of Assistance: 5: Stand by assistance;4: Min assist (CGA) Dynamic Standing Balance Dynamic Standing - Balance Support: During functional activity Dynamic Standing - Level of Assistance: 4: Min assist Extremity/Trunk Assessment RUE Assessment RUE Assessment:  Within Functional Limits LUE Assessment LUE Assessment: Within Functional Limits  Care Tool Care Tool Self Care Eating   Eating Assist Level: Set up assist    Oral Care    Oral Care Assist Level: Set up assist    Bathing   Body parts bathed by patient: Right arm;Left arm;Chest;Abdomen;Front perineal area;Buttocks;Right upper leg;Left upper leg;Face;Right lower leg;Left lower leg     Assist Level: Minimal Assistance - Patient > 75%    Upper Body Dressing(including orthotics)   What is the patient wearing?: Pull over shirt   Assist Level: Contact Guard/Touching assist    Lower Body Dressing (excluding footwear)   What is the patient wearing?: Pants;Underwear/pull up Assist for lower body dressing: Minimal Assistance - Patient > 75%    Putting on/Taking off footwear   What is the patient wearing?: Socks;Shoes Assist for footwear: Minimal Assistance - Patient > 75%  Care Tool Toileting Toileting activity   Assist for toileting: Minimal Assistance - Patient > 75%     Care Tool Bed Mobility Roll left and right activity        Sit to lying activity   Sit to lying assist level: Contact Guard/Touching assist    Lying to sitting on side of bed activity   Lying to sitting on side of bed assist level: the ability to move from lying on the back to sitting on the side of the bed with no back support.: Contact Guard/Touching assist     Care Tool Transfers Sit to stand transfer   Sit to stand assist level: Minimal Assistance - Patient > 75%    Chair/bed transfer   Chair/bed transfer assist level: Minimal Assistance - Patient > 75%     Toilet transfer   Assist Level: Minimal Assistance - Patient > 75%     Care Tool Cognition  Expression of Ideas and Wants Expression of Ideas and Wants: 4. Without difficulty (complex and basic) - expresses complex messages without difficulty and with speech that is clear and easy to understand  Understanding Verbal and Non-Verbal  Content Understanding Verbal and Non-Verbal Content: 3. Usually understands - understands most conversations, but misses some part/intent of message. Requires cues at times to understand   Memory/Recall Ability Memory/Recall Ability : Current season;That he or she is in a hospital/hospital unit   Refer to Care Plan for Long Term Goals  SHORT TERM GOAL WEEK 1 OT Short Term Goal 1 (Week 1): Patient will maintain standing with CGA in preparation for BADL tasks OT Short Term Goal 2 (Week 1): Patient will ambulate into bathroom without device and CGA OT Short Term Goal 3 (Week 1): Pt will tolerate being awak and OOB for 1 hour after therapy session.  Recommendations for other services: None    Skilled Therapeutic Intervention OT eval completed addressing rehab process, OT purpose, POC, ELOS, and goals.  Pt ambulated without device and min A. Simulated BADL tasks completed as described in detail below. Pt with deficits in attention, initiation, and awareness. See below for further details regarding BADL performance.   ADL ADL Eating: Set up Grooming: Setup Upper Body Bathing: Contact guard Lower Body Bathing: Minimal assistance Upper Body Dressing: Contact guard Lower Body Dressing: Minimal assistance Toileting: Minimal assistance Toilet Transfer: Minimal assistance Mobility  Bed Mobility Bed Mobility: Sit to Supine;Supine to Sit Supine to Sit: Supervision/Verbal cueing Sit to Supine: Supervision/Verbal cueing Transfers Sit to Stand: Minimal Assistance - Patient > 75% Stand to Sit: Minimal Assistance - Patient > 75%   Discharge Criteria: Patient will be discharged from OT if patient refuses treatment 3 consecutive times without medical reason, if treatment goals not met, if there is a change in medical status, if patient makes no progress towards goals or if patient is discharged from hospital.  The above assessment, treatment plan, treatment alternatives and goals were discussed and  mutually agreed upon: by patient  Mal Amabile 10/20/2023, 12:31 PM

## 2023-10-20 NOTE — Evaluation (Signed)
Speech Language Pathology Assessment and Plan  Patient Details  Name: Charles Dorsey MRN: [id number] Date of Birth: 07-31-1961  SLP Diagnosis: Cognitive Impairments  Rehab Potential: Good ELOS: 5-7 days    Today's Date: 10/20/2023 SLP Individual Time: 0900-0950 SLP Individual Time Calculation (min): 50 min   Hospital Problem: Principal Problem:   TBI (traumatic brain injury) (HCC)  Past Medical History:  Past Medical History:  Diagnosis Date   Acid reflux    CAD (coronary artery disease)    Diabetes mellitus without complication (HCC)    Hyperlipidemia    Hypothyroidism    Insomnia    Thyroid nodule    Past Surgical History:  Past Surgical History:  Procedure Laterality Date   CORONARY STENT INTERVENTION N/A 03/18/2020   Procedure: CORONARY STENT INTERVENTION;  Surgeon: Elder Negus, MD;  Location: [facility];  Service: Cardiovascular;  Laterality: N/A;   CORONARY ULTRASOUND/IVUS N/A 03/18/2020   Procedure: Intravascular Ultrasound/IVUS;  Surgeon: Elder Negus, MD;  Location: [facility];  Service: Cardiovascular;  Laterality: N/A;   LEFT HEART CATH AND CORONARY ANGIOGRAPHY N/A 03/18/2020   Procedure: LEFT HEART CATH AND CORONARY ANGIOGRAPHY;  Surgeon: Elder Negus, MD;  Location: [facility];  Service: Cardiovascular;  Laterality: N/A;    Assessment / Plan / Recommendation Clinical Impression  Per chart review: Charles Dorsey is a 62 year old [place] male of [place] descent with history of T2DM, CAD who was admitted on 10/07/23 after falling 10 feet off a ladder on a chart while trying to paint his carport.  ETOH level 62 at admission. He struck his head, has amnesia of events, was noted to have bleeding from left ear and was nauseous enroute to ED. He was found to have SAH and SDH along right cerebral convexity, SDH extending along right tentorial leaflet and posterior falx, longitudinal fracture left temporal bone with blood  products left mastoid air cell and middle ear with fracture extending into left parietal bone and let TMJ as well as incidental finding of right disc bulge C5/C6 with moderate spinal canal narrowing.  Ct abdomen/pelvis showed diffuse mucosal enhancement of colon with hyperemia favored to be nonspecific infectious or inflammatory, enlarged/heterogenous thyroid gland and multiple pulmonary nodules 5 mm (no follow up recommended). ENT/Dr.Caldwell consulted and recommended Ciprodex added to left ear canal and defer TMJ management to oral surgery. Surgical decompression if patient losses movement of left facial nerve.   Dr. Franky Macho recommended monitoring with serial CT head which showed "blossoming contusions right frontal and right temporal lobe"     He had sudden onset of unresponsiveness on 11/16 with CT head showing continued contusions with edema felt to be due to progressive hyponatremia and TBI. Hypertonic saline added and Keppra resumed by Dr. Jordan Likes. He had improvement in mentation with increased ability to follow commands and hypertonic saline weaned off by 11/18 with improvement. Sodium started trending down again and Urea added 11/20 with increase in sodium chloride tabs to 3 mg TID.  Sodium trended down to 128 on 11/21 which was treated with dose of lasix and has improved to 132. Therapy has been working with patient who continues to be limited by pain, dizziness question due to vestibular issues, unsteady and scissoring gait requiring min assist.  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 CIR recommended due to functional decline.   Cognitive-Linguistic Evaluation Pt is a 62yo male who was seen for skilled ST evaluation post TBI.  Upon SLP entry, patient was sitting in chair, joined by his wife and the interpreter.  Patient and wife endorse no new thinking difficulty that they are aware of.  Patient was agreeable to completing ST evaluation.  SLP used components of the CLQT for today's  assessment.  Due to patient pain and toileting interruptions, SLP unable to complete this session. See table below for scores in subtests completed.  SLP observed decreased re: awareness of deficits, attention, recall, processing speed.  Provided education to wife and she had no further questions at this time.  Should be noted that scores could be impacted by acute head pain. Continue to monitor and assess as needed.    Cognitive Linguistic Quick Test: AGE - 18 - 69   The Cognitive Linguistic Quick Test (CLQT) was administered to assess the relative status of five cognitive domains: attention, memory, language, executive functioning, and visuospatial skills. Scores from 10 tasks were used to estimate severity ratings (standardized for age groups 18-69 years and 70-89 years) for each domain, a clock drawing task, as well as an overall composite severity rating of cognition.       Task Score Criterion Cut Scores  Personal Facts 8/8 8  Symbol Cancellation 0/12 11  Confrontation Naming 3/10 10  Clock Drawing  9/13 12  Story Retelling 6/10 6  Symbol Trails  9  Generative Naming 3/9 5  Design Memory  5  Mazes   7  Design Generation  6     SLP recommend skilled ST services to address cognitive-communication impairments to maximize functional independence.      Skilled Therapeutic Interventions          SLP completed cognitive-linguistic evaluation  SLP Assessment  Patient will need skilled Speech Lanaguage Pathology Services during CIR admission    Recommendations  Follow up Recommendations: Outpatient SLP Equipment Recommended: None recommended by SLP    SLP Frequency 3 to 5 out of 7 days   SLP Duration  SLP Intensity  SLP Treatment/Interventions 5-7 days  Minumum of 1-2 x/day, 30 to 90 minutes  Cognitive remediation/compensation;Internal/external aids;Cueing hierarchy;Environmental controls;Therapeutic Activities;Functional tasks;Patient/family education;Therapeutic Exercise     Pain Pain Assessment Pain Scale: 0-10 Pain Score: 7  Pain Type: Acute pain Pain Location: Head Pain Orientation: Right;Anterior Pain Descriptors / Indicators: Headache Pain Onset: On-going Pain Intervention(s): Medication (See eMAR);Rest  Prior Functioning Type of Home: House  Lives With: Spouse;Daughter Available Help at Discharge: Family;Available PRN/intermittently Vocation: Full time employment  SLP Evaluation Cognition Overall Cognitive Status: Impaired/Different from baseline Arousal/Alertness: Awake/alert Orientation Level: Oriented X4 Year: 2024 Month: November Day of Week: Correct Attention: Focused;Sustained;Selective Focused Attention: Appears intact Sustained Attention: Appears intact Selective Attention: Impaired Selective Attention Impairment: Verbal basic Memory: Impaired Memory Impairment: Retrieval deficit Awareness: Impaired Awareness Impairment: Emergent impairment Problem Solving: Impaired Problem Solving Impairment: Functional basic Executive Function: Landscape architect: Impaired Organizing Impairment: Verbal basic Behaviors: Impulsive Safety/Judgment: Impaired Rancho Mirant Scales of Cognitive Functioning: Confused, Appropriate: Moderate Assistance  Comprehension Auditory Comprehension Overall Auditory Comprehension: Appears within functional limits for tasks assessed Visual Recognition/Discrimination Discrimination: Within Function Limits Reading Comprehension Reading Status: Not tested Expression Expression Primary Mode of Expression: Verbal Verbal Expression Overall Verbal Expression: Appears within functional limits for tasks assessed Written Expression Dominant Hand: Right Written Expression: Not tested Oral Motor Oral Motor/Sensory Function Overall Oral Motor/Sensory Function: Mild impairment Facial Symmetry: Abnormal symmetry left;Suspected CN VII (facial) dysfunction Lingual Symmetry: Within Functional  Limits Motor Speech Overall Motor Speech: Appears within functional limits for tasks  assessed  Care Tool Care Tool Cognition Ability to hear (with hearing aid or hearing appliances if normally used Ability to hear (with hearing aid or hearing appliances if normally used): 0. Adequate - no difficulty in normal conservation, social interaction, listening to TV   Expression of Ideas and Wants Expression of Ideas and Wants: 4. Without difficulty (complex and basic) - expresses complex messages without difficulty and with speech that is clear and easy to understand   Understanding Verbal and Non-Verbal Content Understanding Verbal and Non-Verbal Content: 3. Usually understands - understands most conversations, but misses some part/intent of message. Requires cues at times to understand  Memory/Recall Ability Memory/Recall Ability : Current season;That he or she is in a hospital/hospital unit    Short Term Goals: Week 1: SLP Short Term Goal 1 (Week 1): STG= LTG due to ELOS  Refer to Care Plan for Long Term Goals  Recommendations for other services: None   Discharge Criteria: Patient will be discharged from SLP if patient refuses treatment 3 consecutive times without medical reason, if treatment goals not met, if there is a change in medical status, if patient makes no progress towards goals or if patient is discharged from hospital.  The above assessment, treatment plan, treatment alternatives and goals were discussed and mutually agreed upon: by patient and by family  Ann Lions 10/20/2023, 3:17 PM

## 2023-10-20 NOTE — Plan of Care (Signed)
  Problem: RH Balance Goal: LTG: Patient will maintain dynamic sitting balance (OT) Description: LTG:  Patient will maintain dynamic sitting balance with assistance during activities of daily living (OT) Flowsheets (Taken 10/20/2023 1213) LTG: Pt will maintain dynamic sitting balance during ADLs with: Independent Goal: LTG Patient will maintain dynamic standing with ADLs (OT) Description: LTG:  Patient will maintain dynamic standing balance with assist during activities of daily living (OT)  Flowsheets (Taken 10/20/2023 1213) LTG: Pt will maintain dynamic standing balance during ADLs with: Supervision/Verbal cueing   Problem: Sit to Stand Goal: LTG:  Patient will perform sit to stand in prep for activites of daily living with assistance level (OT) Description: LTG:  Patient will perform sit to stand in prep for activites of daily living with assistance level (OT) Flowsheets (Taken 10/20/2023 1213) LTG: PT will perform sit to stand in prep for activites of daily living with assistance level: Supervision/Verbal cueing   Problem: RH Bathing Goal: LTG Patient will bathe all body parts with assist levels (OT) Description: LTG: Patient will bathe all body parts with assist levels (OT) Flowsheets (Taken 10/20/2023 1213) LTG: Pt will perform bathing with assistance level/cueing: Supervision/Verbal cueing   Problem: RH Dressing Goal: LTG Patient will perform upper body dressing (OT) Description: LTG Patient will perform upper body dressing with assist, with/without cues (OT). Flowsheets (Taken 10/20/2023 1213) LTG: Pt will perform upper body dressing with assistance level of: Set up assist Goal: LTG Patient will perform lower body dressing w/assist (OT) Description: LTG: Patient will perform lower body dressing with assist, with/without cues in positioning using equipment (OT) Flowsheets (Taken 10/20/2023 1213) LTG: Pt will perform lower body dressing with assistance level of: Supervision/Verbal  cueing   Problem: RH Toileting Goal: LTG Patient will perform toileting task (3/3 steps) with assistance level (OT) Description: LTG: Patient will perform toileting task (3/3 steps) with assistance level (OT)  Flowsheets (Taken 10/20/2023 1213) LTG: Pt will perform toileting task (3/3 steps) with assistance level: Supervision/Verbal cueing   Problem: RH Toilet Transfers Goal: LTG Patient will perform toilet transfers w/assist (OT) Description: LTG: Patient will perform toilet transfers with assist, with/without cues using equipment (OT) Flowsheets (Taken 10/20/2023 1213) LTG: Pt will perform toilet transfers with assistance level of: Supervision/Verbal cueing   Problem: RH Tub/Shower Transfers Goal: LTG Patient will perform tub/shower transfers w/assist (OT) Description: LTG: Patient will perform tub/shower transfers with assist, with/without cues using equipment (OT) Flowsheets (Taken 10/20/2023 1213) LTG: Pt will perform tub/shower stall transfers with assistance level of: Supervision/Verbal cueing   Problem: RH Attention Goal: LTG Patient will demonstrate this level of attention during functional activites (OT) Description: LTG:  Patient will demonstrate this level of attention during functional activites  (OT) Flowsheets (Taken 10/20/2023 1213) LTG: Patient will demonstrate this level of attention during functional activites (OT): Supervision

## 2023-10-20 NOTE — Progress Notes (Signed)
PROGRESS NOTE   Subjective/Complaints:  Patient complaining of ongoing headache, unimproved with use of gabapentin.  Was only able to consume 20% of 1 meal yesterday, however reports wife is bringing in food from home that he is able to eat.  Denies any vision changes or vomiting associated with headaches, but does have some nausea.   Vital stable, labs today stable. Endorses last bowel movement 11-19.  ROS: Positive for unremitting headache.  Denies fevers, chills, N/V, abdominal pain, constipation, diarrhea, SOB, cough, chest pain, new weakness or paraesthesias.    Objective:   No results found. Recent Labs    10/20/23 0508  WBC 9.5  HGB 12.6*  HCT 35.0*  PLT 396   Recent Labs    10/19/23 0647 10/20/23 0508  NA 132* 133*  K 3.8 4.1  CL 97* 99  CO2 23 25  GLUCOSE 110* 119*  BUN 25* 17  CREATININE 0.66 0.76  CALCIUM 9.2 9.1    Intake/Output Summary (Last 24 hours) at 10/20/2023 2202 Last data filed at 10/20/2023 1900 Gross per 24 hour  Intake 590 ml  Output 650 ml  Net -60 ml        Physical Exam: Vital Signs Blood pressure 131/72, pulse 73, temperature 98.7 F (37.1 C), resp. rate 17, height 5\' 8"  (1.727 m), weight 63 kg, SpO2 100%. Constitutional: Mild distress due to headache.. Appropriate appearance for age.  Laying in bed. HENT: No JVD. Neck Supple. Trachea midline. Atraumatic, normocephalic. Cardiovascular: RRR, no murmurs/rub/gallops. No Edema. Peripheral pulses 2+  Respiratory: CTAB. No rales, rhonchi, or wheezing. On RA.  Abdomen: + bowel sounds, normoactive. No distention or tenderness.  Skin: C/D/I. No apparent lesions. MSK:      No apparent deformity.       Neurologic exam:  Cognition: AAO to person, place, time and event.  No apparent cognitive deficits. Insight: Good  insight into current condition.  Mood: Pleasant affect, appropriate mood.  Sensation: To light touch reduced in distal  RUE and RLE Reflexes: 2+ in BL UE and LEs. Negative Hoffman's and babinski signs bilaterally.  CN:  Mild left facial weakness.  Coordination: No apparent tremors. No ataxia on FTN, HTS bilaterally.  Strength:                RUE: 5/5 SA, 5/5 EF, 5/5 EE, 5/5 WE, 5/5 FF, 5/5 FA                 LUE: 5-/5 SA, 5/5 EF, 5/5 EE, 5/5 WE, 5/5 FF, 5/5 FA                 RLE: 5/5 HF, 5/5 KE, 5/5 DF, 5/5 EHL, 5/5 PF                 LLE:  5/5 HF, 5/5 KE, 5/5 DF, 5/5 EHL, 5/5 PF    Assessment/Plan: 1. Functional deficits which require 3+ hours per day of interdisciplinary therapy in a comprehensive inpatient rehab setting. Physiatrist is providing close team supervision and 24 hour management of active medical problems listed below. Physiatrist and rehab team continue to assess barriers to discharge/monitor patient progress toward functional and medical goals  Care Tool:  Bathing    Body parts bathed by patient: Right arm, Left arm, Chest, Abdomen, Front perineal area, Buttocks, Right upper leg, Left upper leg, Face, Right lower leg, Left lower leg         Bathing assist Assist Level: Minimal Assistance - Patient > 75%     Upper Body Dressing/Undressing Upper body dressing   What is the patient wearing?: Pull over shirt    Upper body assist Assist Level: Contact Guard/Touching assist    Lower Body Dressing/Undressing Lower body dressing      What is the patient wearing?: Pants, Underwear/pull up     Lower body assist Assist for lower body dressing: Minimal Assistance - Patient > 75%     Toileting Toileting    Toileting assist Assist for toileting: Minimal Assistance - Patient > 75%     Transfers Chair/bed transfer  Transfers assist     Chair/bed transfer assist level: Minimal Assistance - Patient > 75%     Locomotion Ambulation   Ambulation assist      Assist level: Minimal Assistance - Patient > 75% Assistive device: No Device Max distance: 79ft   Walk 10 feet  activity   Assist     Assist level: Minimal Assistance - Patient > 75% Assistive device: No Device   Walk 50 feet activity   Assist    Assist level: Minimal Assistance - Patient > 75% Assistive device: No Device    Walk 150 feet activity   Assist Walk 150 feet activity did not occur: Safety/medical concerns         Walk 10 feet on uneven surface  activity   Assist Walk 10 feet on uneven surfaces activity did not occur: Safety/medical concerns         Wheelchair     Assist Is the patient using a wheelchair?: Yes Type of Wheelchair: Manual    Wheelchair assist level: Dependent - Patient 0%      Wheelchair 50 feet with 2 turns activity    Assist        Assist Level: Dependent - Patient 0%   Wheelchair 150 feet activity     Assist      Assist Level: Dependent - Patient 0%   Blood pressure 131/72, pulse 73, temperature 98.7 F (37.1 C), resp. rate 17, height 5\' 8"  (1.727 m), weight 63 kg, SpO2 100%.  1. 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  Antithrombotics: -DVT/anticoagulation:  Pharmaceutical: Lovenox             -antiplatelet therapy: Off ASA due to TBI/bleed 3. Persistent headaches/Pain Management: Continue tylenol qid. Fioricet/oxycodone ineffective --start gabapentin 300 mg bid with 600 mg at nights for sleep and pain per discussion w/Dr. Shearon Stalls 11-23: Add Topamax 25 mg twice daily.  4. Mood/Behavior/Sleep: LCSW to follow for evaluation and support.              -antipsychotic agents: N/A             --gabapentin 600 mg/hs for sleep and pain. Trazodone prn also on board.  5. Neuropsych/cognition: This patient may be intermittently capable of making decisions on his own  behalf. 6. Skin/Wound Care: Routine pressure relief measures.  7. Fluids/Electrolytes/Nutrition: Daily  BMET to monitor sodium. Continue 1200 cc FR             --change ensure to Ensure max to prevent surgar spikes. Will add prostat also  11-23: Wife bring in food from home, patient minimally eating hospital food, may complicate picture.  Will provide education  8. FUO: Resolving. Attempt/encourage pulmonary hygiene with flutter valve qid             --leucocytosis on last CBC 11/15-->monitor for signs of infection and for rise in white count --recheck CBC in am.   -11-23: No recurrent fever, leukocytosis stable/resolved, monitor  10. Hyponatremia: Cerebral salt wasting?Marland Kitchen On Ure-Na since 11/20 and salt tabs 3 mg TID             --monitor with daily checks as continues to fluctuate--> 132-->134-->134-->132-->128--132             --po intake seems to be variable. Needs to be fed per chart review   11-23: NA 133, continue current regimen through Monday and if no worsening hyponatremia can start to wean salt tabs  11. Pre-renal azotemia: Likely due to FR and dose of lasix yesterday.  12. Dizziness: Order vestibular evaluation. Check orthostatic vitals to monitor for drop with activity             --may need scopolamine patch is symptoms limiting and BP stable  11-23: Was extremely orthostatic on exam with PT and OT today, add TED hose and abdominal binder, may need addition of midodrine due to fluid restriction from hyponatremia  13. HTN: Monitor BP TID--continue Cozaar and metoprolol. Will set hold parameters.    10/20/2023    7:40 PM 10/20/2023    3:59 AM 10/20/2023   12:23 AM  Vitals with BMI  Systolic 131 135 295  Diastolic 72 70 73  Pulse 73 60 62   11-23: Normotensive, monitor  14. T2DM: Monitor BS ac/hs. Hgb A1C- 8.4.  Po intake variable with fluctuating BS.              --continue Insulin glargline 10 units daily with SSI for elevated BS/tighter control.              --transition Insulin to metformin as intake improves. Ensure changed to ensure Max as lower carbs.   11-23:  Difficult to track carbs due to wife bringing in food.  Will monitor trends today and adjust long-acting as necessary Recent Labs    10/20/23 1206 10/20/23 1624 10/20/23 2109  GLUCAP 183* 211* 229*     15. CAD s/p PTCS/stent LAD: followed by Dr. Sherri Sear. Treated with metoprolol, Cozaar, ASA, lipitor             --monitor for symptoms with activity.  16. Abdominal pain: Likely due to GERD--had BM today --PPI resumed.  11-23: Increasing Senokot to 1 tab twice daily due to constipation, last bowel movement 11-19  17. Left TMJ fracture: No difficulty or pain with chewing. No facial pain or increase in weakness.  --Left facial weakness was noted by NS at admission. Monitor for now.  18. Seizure prophylaxis: Continue Keppra bid.    LOS: 1 days A FACE TO FACE EVALUATION WAS PERFORMED  Angelina Sheriff 10/20/2023, 10:02 PM

## 2023-10-20 NOTE — Plan of Care (Signed)
  Problem: RH Balance Goal: LTG Patient will maintain dynamic sitting balance (PT) Description: LTG:  Patient will maintain dynamic sitting balance with assistance during mobility activities (PT) Flowsheets (Taken 10/20/2023 1800) LTG: Pt will maintain dynamic sitting balance during mobility activities with:: Independent with assistive device  Goal: LTG Patient will maintain dynamic standing balance (PT) Description: LTG:  Patient will maintain dynamic standing balance with assistance during mobility activities (PT) Flowsheets (Taken 10/20/2023 1800) LTG: Pt will maintain dynamic standing balance during mobility activities with:: Supervision/Verbal cueing   Problem: Sit to Stand Goal: LTG:  Patient will perform sit to stand with assistance level (PT) Description: LTG:  Patient will perform sit to stand with assistance level (PT) Flowsheets (Taken 10/20/2023 1800) LTG: PT will perform sit to stand in preparation for functional mobility with assistance level: Independent with assistive device   Problem: RH Bed Mobility Goal: LTG Patient will perform bed mobility with assist (PT) Description: LTG: Patient will perform bed mobility with assistance, with/without cues (PT). Flowsheets (Taken 10/20/2023 1800) LTG: Pt will perform bed mobility with assistance level of: Independent with assistive device    Problem: RH Bed to Chair Transfers Goal: LTG Patient will perform bed/chair transfers w/assist (PT) Description: LTG: Patient will perform bed to chair transfers with assistance (PT). Flowsheets (Taken 10/20/2023 1800) LTG: Pt will perform Bed to Chair Transfers with assistance level: Independent with assistive device    Problem: RH Car Transfers Goal: LTG Patient will perform car transfers with assist (PT) Description: LTG: Patient will perform car transfers with assistance (PT). Flowsheets (Taken 10/20/2023 1800) LTG: Pt will perform car transfers with assist:: Supervision/Verbal cueing    Problem: RH Ambulation Goal: LTG Patient will ambulate in controlled environment (PT) Description: LTG: Patient will ambulate in a controlled environment, # of feet with assistance (PT). Flowsheets (Taken 10/20/2023 1800) LTG: Pt will ambulate in controlled environ  assist needed:: Supervision/Verbal cueing LTG: Ambulation distance in controlled environment: 184ft using LRAD Goal: LTG Patient will ambulate in home environment (PT) Description: LTG: Patient will ambulate in home environment, # of feet with assistance (PT). Flowsheets (Taken 10/20/2023 1800) LTG: Pt will ambulate in home environ  assist needed:: Supervision/Verbal cueing LTG: Ambulation distance in home environment: 50ft using LRAD   Problem: RH Stairs Goal: LTG Patient will ambulate up and down stairs w/assist (PT) Description: LTG: Patient will ambulate up and down # of stairs with assistance (PT) Flowsheets (Taken 10/20/2023 1800) LTG: Pt will ambulate up/down stairs assist needed:: Supervision/Verbal cueing LTG: Pt will  ambulate up and down number of stairs: 8 steps using HRs per home set-up

## 2023-10-21 ENCOUNTER — Inpatient Hospital Stay (HOSPITAL_COMMUNITY): Payer: BC Managed Care – PPO

## 2023-10-21 DIAGNOSIS — S069X9S Unspecified intracranial injury with loss of consciousness of unspecified duration, sequela: Secondary | ICD-10-CM | POA: Diagnosis not present

## 2023-10-21 LAB — GLUCOSE, CAPILLARY
Glucose-Capillary: 160 mg/dL — ABNORMAL HIGH (ref 70–99)
Glucose-Capillary: 264 mg/dL — ABNORMAL HIGH (ref 70–99)
Glucose-Capillary: 160 mg/dL — ABNORMAL HIGH (ref 70–99)
Glucose-Capillary: 147 mg/dL — ABNORMAL HIGH (ref 70–99)

## 2023-10-21 LAB — BASIC METABOLIC PANEL
Anion gap: 9 (ref 5–15)
BUN: 19 mg/dL (ref 6–20)
CO2: 21 mmol/L — ABNORMAL LOW (ref 22–32)
Calcium: 9.2 mg/dL (ref 8.9–10.3)
Chloride: 99 mmol/L (ref 98–111)
Creatinine, Ser: 0.62 mg/dL (ref 0.61–1.24)
GFR, Estimated: >60 mL/min (ref >60)
Glucose, Bld: 162 mg/dL — ABNORMAL HIGH (ref 70–99)
Potassium: 4.3 mmol/L (ref 3.5–5.1)
Sodium: 129 mmol/L — ABNORMAL LOW (ref 135–145)

## 2023-10-21 MED ORDER — TOPIRAMATE 25 MG PO TABS
50.0000 mg | ORAL_TABLET | Freq: Two times a day (BID) | ORAL | Status: DC
  Administered 2023-10-22 – 2023-10-25 (×7): 50 mg via ORAL
  Filled 2023-10-21 (×7): qty 2

## 2023-10-21 MED ORDER — AMLODIPINE BESYLATE 5 MG PO TABS
5.0000 mg | ORAL_TABLET | Freq: Every day | ORAL | Status: DC
  Administered 2023-10-22 – 2023-10-25 (×4): 5 mg via ORAL
  Filled 2023-10-21 (×4): qty 1

## 2023-10-21 MED ORDER — DICLOFENAC SODIUM 1 % EX GEL
2.0000 g | Freq: Four times a day (QID) | CUTANEOUS | Status: DC
  Administered 2023-10-21 – 2023-10-23 (×5): 2 g via TOPICAL
  Filled 2023-10-21: qty 100

## 2023-10-21 MED ORDER — INSULIN GLARGINE-YFGN 100 UNIT/ML ~~LOC~~ SOLN: 13.0000 [IU] | Freq: Every day | SUBCUTANEOUS | Status: DC

## 2023-10-21 MED ORDER — INSULIN GLARGINE-YFGN 100 UNIT/ML ~~LOC~~ SOLN
5.0000 [IU] | Freq: Every day | SUBCUTANEOUS | Status: DC
  Administered 2023-10-22: 5 [IU] via SUBCUTANEOUS
  Filled 2023-10-21 (×2): qty 0.05

## 2023-10-21 MED ORDER — GABAPENTIN 300 MG PO CAPS
600.0000 mg | ORAL_CAPSULE | Freq: Three times a day (TID) | ORAL | Status: DC
  Administered 2023-10-21 – 2023-10-22 (×5): 600 mg via ORAL
  Filled 2023-10-21 (×5): qty 2

## 2023-10-21 MED ORDER — METFORMIN HCL 500 MG PO TABS
500.0000 mg | ORAL_TABLET | Freq: Two times a day (BID) | ORAL | Status: DC
  Administered 2023-10-21 – 2023-10-22 (×4): 500 mg via ORAL
  Filled 2023-10-21 (×4): qty 1

## 2023-10-21 NOTE — Progress Notes (Signed)
Speech Language Pathology Daily Session Note  Patient Details  Name: Charles Dorsey MRN: [id number] Date of Birth: [birth year]  Today's Date: 10/21/2023 SLP Individual Time: 1100-1200 SLP Individual Time Calculation (min): 60 min  Short Term Goals: Week 1: SLP Short Term Goal 1 (Week 1): STG= LTG due to ELOS  Skilled Therapeutic Interventions: Pt seen for skilled ST with focus on cognitive communication goals, pt in bed sleeping soundly with interpretor and wife present throughout. Pt rouses with extra time and reports headache "is better" today. SLP facilitating problem ID and solution generating task by providing min A cues to ID unsafe/problematic scenarios and Supervision A cue to accurately solve. Discussed fall risks and potential hazard recognition strategies for home environment (pt admits history of unsafe behaviors during home repairs, etc). Pt able to recall and carryover information and strategies with delay, demonstrating increased awareness of current impairments and recommendations at discharge. Pt endorses "feeling older" since fall (cognitively) which when clarified means slower information processing, denies memory impairments. Pt would often answer SLP questions with regards to being in home country (moved to [place] in 1988) with wife adding appropriate cueing as needed. Pt and wife with no questions at this time, cont ST POC.   Pain Pain Assessment Pain Scale: 0-10 Pain Score: 0-No pain  Therapy/Group: Individual Therapy  Tacey Ruiz 10/21/2023, 11:52 AM

## 2023-10-21 NOTE — Progress Notes (Signed)
Physical Therapy Session Note  Patient Details  Name: Charles Dorsey MRN: [id number] Date of Birth: 1960/12/23  Today's Date: 10/21/2023 PT Individual Time: 1305-1330 PT Individual Time Calculation (min): 25 min   Short Term Goals: Week 1:  PT Short Term Goal 1 (Week 1): Pt will perform supine<>sit mod-I PT Short Term Goal 2 (Week 1): Pt will perform bed<>chair transfers using LRAD with supervision PT Short Term Goal 3 (Week 1): Pt will ambulate at least 165ft using LRAD with CGA PT Short Term Goal 4 (Week 1): Pt will navigate at least 8 steps using HRs with CGA PT Short Term Goal 5 (Week 1): Pt will participate in standardized outcome measure to asses fall risk  Skilled Therapeutic Interventions/Progress Updates:      Therapy Documentation Precautions:  Precautions Precautions: Fall Restrictions Weight Bearing Restrictions: No General: PT Amount of Missed Time (min): 35 Minutes PT Missed Treatment Reason: Other (Comment) (HA, dizziness MD aware and requested follow up CT)  Pt received semi-reclined in bed with nurse present for medication administration. PT encouraged pt to don ted hose and explained purpose. Pt with unrelenting HA and also reports  lightheadedness, tinnitus, vertigo and diplopia. Vitals assessed and BP recorded as 114/75 and pulse of 69. MD and nurse aware and pt missed 35 minutes of skilled PT, plan to make up missed minutes as able.     Therapy/Group: Individual Therapy  Truitt Leep Truitt Leep PT, DPT  10/21/2023, 3:35 PM

## 2023-10-21 NOTE — Progress Notes (Signed)
PROGRESS NOTE   Subjective/Complaints:  7/10 headache persistent overnight.  Patient states the headache is bilateral, intermittent, aching. He does not endorse a prior history of migraine headaches, states that he prior got an injection from his cardiologist on the top of his head which improved quite a bit.  This was several years ago.  Does not take any medications other than Tylenol and ibuprofen at home.  Na 129 today; otherwise labs and vitals stable. BG elevates to low 200s. Eating 50-100% meals LBM 11/23  ROS: Positive for unremitting headache-ongoing.  Denies fevers, chills, N/V, abdominal pain, constipation, diarrhea, SOB, cough, chest pain, new weakness or paraesthesias.    Objective:   No results found. Recent Labs    10/20/23 0508  WBC 9.5  HGB 12.6*  HCT 35.0*  PLT 396   Recent Labs    10/20/23 0508 10/21/23 0522  NA 133* 129*  K 4.1 4.3  CL 99 99  CO2 25 21*  GLUCOSE 119* 162*  BUN 17 19  CREATININE 0.76 0.62  CALCIUM 9.1 9.2    Intake/Output Summary (Last 24 hours) at 10/21/2023 0906 Last data filed at 10/20/2023 2140 Gross per 24 hour  Intake 712 ml  Output 650 ml  Net 62 ml        Physical Exam: Vital Signs Blood pressure (!) 144/61, pulse 60, temperature 98.4 F (36.9 C), temperature source Oral, resp. rate 18, height 5\' 8"  (1.727 m), weight 62.4 kg, SpO2 100%. Constitutional: Mild distress due to headache.. Appropriate appearance for age.  Laying in bed. HENT: No JVD. Neck Supple. Trachea midline. Atraumatic, normocephalic. -No tenderness to palpation of neck, bilateral occipital nerves, or top of head.  Cardiovascular: RRR, no murmurs/rub/gallops. No Edema. Peripheral pulses 2+  Respiratory: CTAB. No rales, rhonchi, or wheezing. On RA.  Abdomen: + bowel sounds, normoactive. No distention or tenderness.  Skin: C/D/I. No apparent lesions. MSK:      No apparent deformity.        Neurologic exam:  Cognition: AAO to person, place, time and event.  No apparent cognitive deficits. Insight: Good  insight into current condition.  Mood: Pleasant affect, appropriate mood.  Sensation: To light touch reduced in distal RUE and RLE CN:  Mild left facial weakness.  Strength: Antigravity and against resistance in all 4 extremities, approximately equal bilaterally.  Assessment/Plan: 1. Functional deficits which require 3+ hours per day of interdisciplinary therapy in a comprehensive inpatient rehab setting. Physiatrist is providing close team supervision and 24 hour management of active medical problems listed below. Physiatrist and rehab team continue to assess barriers to discharge/monitor patient progress toward functional and medical goals  Care Tool:  Bathing    Body parts bathed by patient: Right arm, Left arm, Chest, Abdomen, Front perineal area, Buttocks, Right upper leg, Left upper leg, Face, Right lower leg, Left lower leg         Bathing assist Assist Level: Minimal Assistance - Patient > 75%     Upper Body Dressing/Undressing Upper body dressing   What is the patient wearing?: Pull over shirt    Upper body assist Assist Level: Contact Guard/Touching assist    Lower Body Dressing/Undressing Lower  body dressing      What is the patient wearing?: Pants, Underwear/pull up     Lower body assist Assist for lower body dressing: Minimal Assistance - Patient > 75%     Toileting Toileting    Toileting assist Assist for toileting: Minimal Assistance - Patient > 75%     Transfers Chair/bed transfer  Transfers assist     Chair/bed transfer assist level: Minimal Assistance - Patient > 75%     Locomotion Ambulation   Ambulation assist      Assist level: Minimal Assistance - Patient > 75% Assistive device: No Device Max distance: 36ft   Walk 10 feet activity   Assist     Assist level: Minimal Assistance - Patient > 75% Assistive  device: No Device   Walk 50 feet activity   Assist    Assist level: Minimal Assistance - Patient > 75% Assistive device: No Device    Walk 150 feet activity   Assist Walk 150 feet activity did not occur: Safety/medical concerns         Walk 10 feet on uneven surface  activity   Assist Walk 10 feet on uneven surfaces activity did not occur: Safety/medical concerns         Wheelchair     Assist Is the patient using a wheelchair?: Yes Type of Wheelchair: Manual    Wheelchair assist level: Dependent - Patient 0%      Wheelchair 50 feet with 2 turns activity    Assist        Assist Level: Dependent - Patient 0%   Wheelchair 150 feet activity     Assist      Assist Level: Dependent - Patient 0%   Blood pressure (!) 144/61, pulse 60, temperature 98.4 F (36.9 C), temperature source Oral, resp. rate 18, height 5\' 8"  (1.727 m), weight 62.4 kg, SpO2 100%.  1. 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  Antithrombotics: -DVT/anticoagulation:  Pharmaceutical: Lovenox             -antiplatelet therapy: Off ASA due to TBI/bleed 3. Persistent headaches/Pain Management: Continue tylenol qid. Fioricet/oxycodone ineffective --start gabapentin 300 mg bid with 600 mg at nights for sleep and pain per discussion w/Dr. Shearon Stalls 11-23: Add Topamax 25 mg twice daily. 11-24: Increase gabapentin to 600 mg 3 times daily.  In afternoon, severe worsening of headache limiting therapies, repeat CT head stable/improved.  Increase Topamax to 50 mg twice daily for tomorrow.  4. Mood/Behavior/Sleep: LCSW to follow for evaluation and support.              -antipsychotic agents: N/A             --gabapentin 600 mg/hs for sleep and pain. Trazodone prn also on board.  5. Neuropsych/cognition: This patient may be  intermittently capable of making decisions on his own behalf. 6. Skin/Wound Care: Routine pressure relief measures.  7. Fluids/Electrolytes/Nutrition: Daily BMET to monitor sodium. Continue 1200 cc FR             --change ensure to Ensure max to prevent surgar spikes. Will add prostat also  11-23: Wife bring in food from home, patient  minimally eating hospital food, may complicate picture.  Will provide education -good p.o.'s recorded.  8. FUO: Resolving. Attempt/encourage pulmonary hygiene with flutter valve qid             --leucocytosis on last CBC 11/15-->monitor for signs of infection and for rise in white count --recheck CBC in am.   -11-23: No recurrent fever, leukocytosis stable/resolved, monitor  10. Hyponatremia: Cerebral salt wasting?Marland Kitchen On Ure-Na since 11/20 and salt tabs 3 mg TID             --monitor with daily checks as continues to fluctuate--> 132-->134-->134-->132-->128--132             --po intake seems to be variable. Needs to be fed per chart review   11-23: NA 133, continue current regimen through Monday and if no worsening hyponatremia can start to wean salt tabs  11/24: Na 129 today; change Cozaar 15 mg daily to Norvasc 5 mg daily starting 11/25. Already on 0.5 mg/kg dosing for Ure-Na and 3 G TID salt tabs with fluid restriction.  Patient has been refusing urine powder due to taste; encouraged him to take and advised if continued downtrend may need more aggressive measures.   11. Pre-renal azotemia: Likely due to FR and dose of lasix yesterday.  12. Dizziness: Order vestibular evaluation. Check orthostatic vitals to monitor for drop with activity             --may need scopolamine patch is symptoms limiting and BP stable  11-23: Was extremely orthostatic on exam with PT and OT today, add TED hose and abdominal binder, may need addition of midodrine due to fluid restriction from hyponatremia  11-24: Reportedly much improved with therapies with binders and TED hose, with  blood pressure into the 160s; maintained in the 140s with TED hose only.  13. HTN: Monitor BP TID--continue Cozaar and metoprolol. Will set hold parameters.    10/21/2023    4:01 AM 10/20/2023    7:40 PM 10/20/2023    3:59 AM  Vitals with BMI  Weight 137 lbs 9 oz    BMI 20.92    Systolic 144 131 202  Diastolic 61 72 70  Pulse 60 73 60   11-23/24: Normotensive, monitor  14. T2DM: Monitor BS ac/hs. Hgb A1C- 8.4.  Po intake variable with fluctuating BS.              --continue Insulin glargline 10 units daily with SSI for elevated BS/tighter control.              --transition Insulin to metformin as intake improves. Ensure changed to ensure Max as lower carbs.   11-23: Difficult to track carbs due to wife bringing in food.  Will monitor trends today and adjust long-acting as necessary  11/24:BMP BUN/Cr looks good; intakes stable; add Metformin 500 mg BID and reduce semglee to 5 U for 11/25  Recent Labs    10/21/23 1129 10/21/23 1659 10/21/23 2032  GLUCAP 264* 160* 147*     15. CAD s/p PTCS/stent LAD: followed by Dr. Sherri Sear. Treated with metoprolol, Cozaar, ASA, lipitor             --monitor for symptoms with activity.  16. Abdominal pain: Likely due to GERD--had BM today --PPI resumed.  11-23: Increasing Senokot to 1 tab twice daily due to constipation, last bowel movement 11-19  17. Left TMJ fracture: No difficulty or pain with chewing. No facial pain or increase in weakness.  --Left facial weakness was noted  by NS at admission. Monitor for now.  18. Seizure prophylaxis: Continue Keppra bid.  No seizures since admission.    LOS: 2 days A FACE TO FACE EVALUATION WAS PERFORMED  Angelina Sheriff 10/21/2023, 9:06 AM

## 2023-10-21 NOTE — Progress Notes (Signed)
Occupational Therapy TBI Note  Patient Details  Name: Charles Dorsey MRN: [id number] Date of Birth: 12/24/1960  Today's Date: 10/21/2023 OT Individual Time: 3220-2542 OT Individual Time Calculation (min): 70 min    Short Term Goals: Week 1:  OT Short Term Goal 1 (Week 1): Patient will maintain standing with CGA in preparation for BADL tasks OT Short Term Goal 2 (Week 1): Patient will ambulate into bathroom without device and CGA OT Short Term Goal 3 (Week 1): Pt will tolerate being awak and OOB for 1 hour after therapy session.  Skilled Therapeutic Interventions/Progress Updates:    1:1 Pt eating sitting up in the recliner. Pt reports he has a 10/10 headache and it is bothering him so much.  RN and MD are already aware. Pt agrees to participating as he can. Pt did not have TEDS or binder donned and in sitting BP 122/98 HR 72 Then in standing at recliner : 101/68 HR 84 Decided to sit in w/c and be transported to shower. Stand pivot transfer to toilet with min guard and then to shower with min guard. Pt was able to wash self in seated position with setup. Pt performed stand pivot to the w/c to dress with setup of clothes provided. Assisted with donning pt's TEDS and abdominal binder- pt ambulated back to recliner and BP was then 163/76 HR 64. Doff binder and ambulated to the dayroom and BP was 139/68 HR 64 Left binder off.   Pt participated in sit to stands from the EOM while holding the Tidal Tank away from his body with shoulders at 90 degrees focus on balance and attention to task. Pt then participated in following  1-2 step direction task on paper and pen. Pt with difficulty with following some of the directions (could be culture/language related - however the interpreter was at helping the patient follow the directions. ) pt required mod A to follow directions as they were given.   Continued to assess vision functionally with tasks. Pt able to track at slow speed but at times his gaze would  deviate. Pt with difficulty with gaze stabilization with head movements especially head movement to the left. Pt reports his right eye is sometimes blurry.  Pt returned to the room carrying the Tidal tank - work hardening and left resting in recliner with safety belt donn and wife at bedside.   Therapy Documentation Precautions:  Precautions Precautions: Fall Restrictions Weight Bearing Restrictions: No  Pain: Pain initially was a 10 /10 and then by end of session was 6/10 in his head. Pt had meds before session Agitated Behavior Scale: TBI Observation Details Observation Environment: room/ dayroom Start of observation period - Date: 10/21/23 Start of observation period - Time: 0845 End of observation period - Date: 10/21/23 End of observation period - Time: 0955 Agitated Behavior Scale (DO NOT LEAVE BLANKS) Short attention span, easy distractibility, inability to concentrate: Absent Impulsive, impatient, low tolerance for pain or frustration: Absent Uncooperative, resistant to care, demanding: Absent Violent and/or threatening violence toward people or property: Absent Explosive and/or unpredictable anger: Absent Rocking, rubbing, moaning, or other self-stimulating behavior: Absent Pulling at tubes, restraints, etc.: Absent Wandering from treatment areas: Absent Restlessness, pacing, excessive movement: Absent Repetitive behaviors, motor, and/or verbal: Absent Rapid, loud, or excessive talking: Absent Sudden changes of mood: Absent Easily initiated or excessive crying and/or laughter: Absent Self-abusiveness, physical and/or verbal: Absent Agitated behavior scale total score: 14   Therapy/Group: Individual Therapy  Roney Mans [facility] 10/21/2023, 12:59 PM

## 2023-10-22 DIAGNOSIS — E871 Hypo-osmolality and hyponatremia: Secondary | ICD-10-CM | POA: Diagnosis not present

## 2023-10-22 DIAGNOSIS — E1165 Type 2 diabetes mellitus with hyperglycemia: Secondary | ICD-10-CM

## 2023-10-22 DIAGNOSIS — R7989 Other specified abnormal findings of blood chemistry: Secondary | ICD-10-CM

## 2023-10-22 DIAGNOSIS — G44311 Acute post-traumatic headache, intractable: Secondary | ICD-10-CM

## 2023-10-22 DIAGNOSIS — I10 Essential (primary) hypertension: Secondary | ICD-10-CM

## 2023-10-22 DIAGNOSIS — S069XAD Unspecified intracranial injury with loss of consciousness status unknown, subsequent encounter: Secondary | ICD-10-CM

## 2023-10-22 DIAGNOSIS — Z794 Long term (current) use of insulin: Secondary | ICD-10-CM

## 2023-10-22 LAB — CBC
HCT: 35 % — ABNORMAL LOW (ref 39.0–52.0)
Hemoglobin: 12.2 g/dL — ABNORMAL LOW (ref 13.0–17.0)
MCH: 31.5 pg (ref 26.0–34.0)
MCHC: 34.9 g/dL (ref 30.0–36.0)
MCV: 90.4 fL (ref 80.0–100.0)
Platelets: 443 10*3/uL — ABNORMAL HIGH (ref 150–400)
RBC: 3.87 MIL/uL — ABNORMAL LOW (ref 4.22–5.81)
RDW: 12 % (ref 11.5–15.5)
WBC: 9.6 10*3/uL (ref 4.0–10.5)
nRBC: 0 % (ref 0.0–0.2)

## 2023-10-22 LAB — GLUCOSE, CAPILLARY
Glucose-Capillary: 280 mg/dL — ABNORMAL HIGH (ref 70–99)
Glucose-Capillary: 137 mg/dL — ABNORMAL HIGH (ref 70–99)
Glucose-Capillary: 142 mg/dL — ABNORMAL HIGH (ref 70–99)
Glucose-Capillary: 155 mg/dL — ABNORMAL HIGH (ref 70–99)
Glucose-Capillary: 171 mg/dL — ABNORMAL HIGH (ref 70–99)

## 2023-10-22 LAB — BASIC METABOLIC PANEL
Anion gap: 6 (ref 5–15)
BUN: 27 mg/dL — ABNORMAL HIGH (ref 6–20)
CO2: 23 mmol/L (ref 22–32)
Calcium: 8.8 mg/dL — ABNORMAL LOW (ref 8.9–10.3)
Chloride: 100 mmol/L (ref 98–111)
Creatinine, Ser: 0.72 mg/dL (ref 0.61–1.24)
GFR, Estimated: >60 mL/min (ref >60)
Glucose, Bld: 144 mg/dL — ABNORMAL HIGH (ref 70–99)
Potassium: 4 mmol/L (ref 3.5–5.1)
Sodium: 129 mmol/L — ABNORMAL LOW (ref 135–145)

## 2023-10-22 MED ORDER — TRAMADOL HCL 50 MG PO TABS
50.0000 mg | ORAL_TABLET | Freq: Three times a day (TID) | ORAL | Status: DC | PRN
  Administered 2023-10-22: 50 mg via ORAL
  Filled 2023-10-22: qty 1

## 2023-10-22 NOTE — Progress Notes (Signed)
Inpatient Rehabilitation  Patient information reviewed and entered into eRehab system by Oyuki Hogan M. Eri Mcevers, M.A., CCC/SLP, PPS Coordinator.  Information including medical coding, functional ability and quality indicators will be reviewed and updated through discharge.    

## 2023-10-22 NOTE — IPOC Note (Signed)
Overall Plan of Care [facility]) Patient Details Name: Charles Dorsey MRN: [id number] DOB: 09/14/1961  Admitting Diagnosis: TBI (traumatic brain injury) [facility])  Hospital Problems: Principal Problem:   TBI (traumatic brain injury) (HCC)     Functional Problem List: Nursing Behavior, Endurance, Medication Management, Pain, Safety, Sensory  PT Balance, Pain, Safety, Endurance, Motor, Nutrition  OT Balance, Cognition, Endurance, Motor, Safety  SLP Cognition  TR         Basic ADL's: OT Grooming, Bathing, Dressing, Toileting     Advanced  ADL's: OT       Transfers: PT Bed Mobility, Bed to Chair, Car, [employer], Civil Service fast streamer, Tub/Shower     Locomotion: PT Ambulation, Stairs     Additional Impairments: OT Fuctional Use of Upper Extremity  SLP Social Cognition   Problem Solving, Memory, Attention, Awareness  TR      Anticipated Outcomes Item Anticipated Outcome  Self Feeding Mod I  Swallowing      Basic self-care  Mod I/supervision  Toileting  Multimedia programmer.  Bowel/Bladder  continent of B/B  Transfers  mod-I using LRAD  Locomotion  supervision using LRAD  Communication     Cognition  minA  Pain  less than 4  Safety/Judgment  with cues   Therapy Plan: PT Intensity: Minimum of 1-2 x/day ,45 to 90 minutes PT Frequency: 5 out of 7 days PT Duration Estimated Length of Stay: 10-12 days OT Intensity: Minimum of 1-2 x/day, 45 to 90 minutes OT Frequency: 5 out of 7 days OT Duration/Estimated Length of Stay: 10-12 days SLP Intensity: Minumum of 1-2 x/day, 30 to 90 minutes SLP Frequency: 3 to 5 out of 7 days SLP Duration/Estimated Length of Stay: 5-7 days   Team Interventions: Nursing Interventions Patient/Family Education, Disease Management/Prevention, Pain Management, Medication Management, Cognitive Remediation/Compensation, Discharge Planning  PT interventions Ambulation/gait training, Cognitive remediation/compensation,  Discharge planning, DME/adaptive equipment instruction, Functional mobility training, Pain management, Psychosocial support, Therapeutic Activities, UE/LE Strength taining/ROM, Warden/ranger, Community reintegration, Disease management/prevention, Neuromuscular re-education, Equities trader education, Museum/gallery curator, Therapeutic Exercise, UE/LE Coordination activities  OT Interventions Warden/ranger, Cognitive remediation/compensation, Firefighter, Discharge planning, Disease mangement/prevention, Fish farm manager, Functional electrical stimulation, Functional mobility training, Neuromuscular re-education, Pain management, Patient/family education, Psychosocial support, Self Care/advanced ADL retraining, Skin care/wound managment, Splinting/orthotics, Therapeutic Activities, Therapeutic Exercise, UE/LE Strength taining/ROM, UE/LE Coordination activities, Visual/perceptual remediation/compensation, Wheelchair propulsion/positioning  SLP Interventions Cognitive remediation/compensation, Internal/external aids, Financial trader, Environmental controls, Therapeutic Activities, Functional tasks, Patient/family education, Therapeutic Exercise  TR Interventions    SW/CM Interventions     Barriers to Discharge MD  Medical stability  Nursing Decreased caregiver support, Home environment access/layout, Medication compliance main level B/B with 1 ste down into home  PT Decreased caregiver support, Home environment access/layout, Lack of/limited family support    OT      SLP      SW       Team Discharge Planning: Destination: PT-Home ,OT- Home , SLP-  Projected Follow-up: PT-Outpatient PT, 24 hour supervision/assistance, OT-  Outpatient OT, SLP-Outpatient SLP Projected Equipment Needs: PT-To be determined, OT- To be determined, SLP-None recommended by SLP Equipment Details: PT- , OT-TBD Patient/family involved in discharge planning: PT- Patient,  Family member/caregiver,  OT-Patient, SLP-Family member/caregiver, Patient  MD ELOS: 10-12 days Medical Rehab Prognosis:  Excellent Assessment: The patient has been admitted for CIR therapies with the diagnosis of TBI. The team will be addressing functional mobility, strength, stamina, balance, safety, adaptive techniques and equipment, self-care, bowel  and bladder mgt, patient and caregiver education, NMR, behavior, cognition, NMR, pain control. Goals have been set at mod I for mobility and self-care and supervision to min assist with cogntion. Anticipated discharge destination is home.        See Team Conference Notes for weekly updates to the plan of care

## 2023-10-22 NOTE — Progress Notes (Signed)
Physical Therapy Session Note  Patient Details  Name: Charles Dorsey MRN: [id number] Date of Birth: 03/25/1961  Today's Date: 10/22/2023 PT Individual Time: 1045-1150 PT Individual Time Calculation (min): 65 min   Short Term Goals: Week 1:  PT Short Term Goal 1 (Week 1): Pt will perform supine<>sit mod-I PT Short Term Goal 2 (Week 1): Pt will perform bed<>chair transfers using LRAD with supervision PT Short Term Goal 3 (Week 1): Pt will ambulate at least 178ft using LRAD with CGA PT Short Term Goal 4 (Week 1): Pt will navigate at least 8 steps using HRs with CGA PT Short Term Goal 5 (Week 1): Pt will participate in standardized outcome measure to asses fall risk  Skilled Therapeutic Interventions/Progress Updates:     Pt received supine in bed and agrees to therapy. Reports headache and dizziness, but does not provide number for either. PT provides rest breaks as needed to manage pain. Pt performs bed mobility slowly with bed features. Sit to stand with minA and cues for sequencing. Stand step to [facility] with same assistance. WC transport to gym. Pt's BP assessed in sitting and standing prior to mobility. In sitting BP is 109/69 and in standing BP is 96/68. Pt des not report any worsening of symptoms in standing. Pt stands and ambulates x225' with CGA/minA and no AD, with cues for trunk rotation to improve balance, as well as safe sequencing of transition onto toilet. Pt is continent of urine and then ambulates to [facility] for seated rest break.   Pt performs standing balance activity, tasked with tapping feet on cones to challenge coordination, attention to task, balance, and activity tolerance. Pt completes with minA overall, with cues for stability and repeated commands required for correct performance of activity.   Following rest break, pt completes Nustep for endurance training, reciprocal coordination, and attention to task. Pt completes total of x10:00 but requires multiple rest break, with average  steps per minute ~35 on workload of 5. PT provides cues for hand and foot placement and completing full available ROM. Pt states is very tired and requests to return to room. Pt ambulates x100' back to room with minA for stability and cues for navigation. Pt left semi reclined in bed with alarm intact and all needs within reach.  Therapy Documentation Precautions:  Precautions Precautions: Fall Restrictions Weight Bearing Restrictions: No  Therapy/Group: Individual Therapy  Beau Fanny, PT, DPT 10/22/2023, 4:00 PM

## 2023-10-22 NOTE — Progress Notes (Addendum)
Occupational Therapy TBI Note  Patient Details  Name: Charles Dorsey MRN: [id number] Date of Birth: [birth year]  Today's Date: 10/22/2023 OT Individual Time: 6578-4696 OT Individual Time Calculation (min): 73 min    Short Term Goals: Week 1:  OT Short Term Goal 1 (Week 1): Patient will maintain standing with CGA in preparation for BADL tasks OT Short Term Goal 2 (Week 1): Patient will ambulate into bathroom without device and CGA OT Short Term Goal 3 (Week 1): Pt will tolerate being awak and OOB for 1 hour after therapy session.  Skilled Therapeutic Interventions/Progress Updates:  Pt seen for skilled OT session this am. Resting bed level but agreeable for all activity despite report of fatigue and HA. See below. CGA for self care with min-mod cues for sequncing and safety, CGA for bathroom access with gait belt only and toilet and shower stall transfers, HA limits sessions but improved tolerance to gym access this session with OT after full AM routine, RO, appropriate conversation/humor and overall mental endurance progressing steadily. Pt transported to main gym via w/c for Grip and 9 HPT assessments. Grip 55 lbs L 75 lbs R; 9 HPT 45 sec R 58 sec L. Son in room at end of session with pt returned to bed as per pt request to rest with all needs, nurse call button and bed alarm set.   VSR: 110/64 with thigh high TEDS in place; post standing and amb in room 116/70.    10/22/23 0846  Observation Details  Observation Environment room/main gym  Start of observation period - Date 10/22/23  Start of observation period - Time 0846  End of observation period - Date 10/22/23  End of observation period - Time 0959  Agitated Behavior Scale (DO NOT LEAVE BLANKS)  Short attention span, easy distractibility, inability to concentrate 1  Impulsive, impatient, low tolerance for pain or frustration 1  Uncooperative, resistant to care, demanding 1  Violent and/or threatening violence toward people or property  1  Explosive and/or unpredictable anger 1  Rocking, rubbing, moaning, or other self-stimulating behavior 1  Pulling at tubes, restraints, etc. 1  Wandering from treatment areas 1  Restlessness, pacing, excessive movement 1  Repetitive behaviors, motor, and/or verbal 1  Rapid, loud, or excessive talking 1  Sudden changes of mood 1  Easily initiated or excessive crying and/or laughter 1  Self-abusiveness, physical and/or verbal 1  Agitated behavior scale total score 14    Pain: HA pain 7/10 but able to participate full session, had meds given earlier, rest breaks and distraction and breathing strategies for relief  Therapy Documentation Precautions:  Precautions Precautions: Fall Restrictions Weight Bearing Restrictions: No   Therapy/Group: Individual Therapy  Vicenta Dunning 10/22/2023, 7:49 AM

## 2023-10-22 NOTE — Progress Notes (Signed)
Patient ID: Charles Dorsey, male   DOB: 01-23-1961, 62 y.o.   MRN: [id number]  SW spoke with [name] to introduce self, explain role, inform on ELOS, and discuss discharge process. Plan is for pt to discharge to home with primary support from pt mother. She is aware SW will follow-up with updates after team conference.   Cecile Sheerer, MSW, LCSW Office: [phone] Cell: [phone] Fax: [phone]

## 2023-10-22 NOTE — Progress Notes (Signed)
Inpatient Rehabilitation Care Coordinator Assessment and Plan Patient Details  Name: Charles Dorsey MRN: [id number] Date of Birth: Aug 25, 1961  Today's Date: 10/22/2023  Hospital Problems: Principal Problem:   TBI (traumatic brain injury) [facility])  Past Medical History:  Past Medical History:  Diagnosis Date   Acid reflux    CAD (coronary artery disease)    Diabetes mellitus without complication (HCC)    Hyperlipidemia    Hypothyroidism    Insomnia    Thyroid nodule    Past Surgical History:  Past Surgical History:  Procedure Laterality Date   CORONARY STENT INTERVENTION N/A 03/18/2020   Procedure: CORONARY STENT INTERVENTION;  Surgeon: Elder Negus, MD;  Location: [facility] INVASIVE CV LAB;  Service: Cardiovascular;  Laterality: N/A;   CORONARY ULTRASOUND/IVUS N/A 03/18/2020   Procedure: Intravascular Ultrasound/IVUS;  Surgeon: Elder Negus, MD;  Location: [facility] INVASIVE CV LAB;  Service: Cardiovascular;  Laterality: N/A;   LEFT HEART CATH AND CORONARY ANGIOGRAPHY N/A 03/18/2020   Procedure: LEFT HEART CATH AND CORONARY ANGIOGRAPHY;  Surgeon: Elder Negus, MD;  Location: [facility] INVASIVE CV LAB;  Service: Cardiovascular;  Laterality: N/A;   Social History:  reports that he has been smoking cigarettes. He has never used smokeless tobacco. He reports current alcohol use. He reports that he does not use drugs.  Family / Support Systems Marital Status: Married How Long?: 30+ years Patient Roles: Spouse, Parent Spouse/Significant Other: Lang (wife) Children: Dtr [name] Other Supports: none Anticipated Caregiver: Pt wife Ability/Limitations of Caregiver: Pt wife works 1st shift, however, pt dtr reports her mother will be primary caregiver Caregiver Availability: 24/7 Family Dynamics: Pt lives with his wife.  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 He moved to  [place] in 1988. 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 Pt reports he has annual leave and sick leave that he can use Legal History/Current Legal Issues: Denies Guardian/Conservator: N/A   Abuse/Neglect Abuse/Neglect Assessment Can Be Completed: Yes Physical Abuse: Denies Verbal Abuse: Denies Sexual Abuse: Denies Exploitation of patient/patient's resources: Denies Self-Neglect: Denies  Patient response to: Social Isolation - How often do you feel lonely or isolated from those around you?: Never  Emotional Status Pt's affect, behavior and adjustment status: Pt in good spirits at time of visit Recent Psychosocial Issues: Denies Psychiatric History: Denies Substance Abuse History: pt admits that he smokes a cigarette every once in awhile and etoh as well. HE denies rec drug use.  Patient / Family Perceptions, Expectations & Goals Pt/Family understanding of illness & functional limitations: Pt and family have a general understanding of pt care needs Premorbid pt/family roles/activities: Independent Anticipated changes in roles/activities/participation: Assistance with ADLs/IADLs Pt/family expectations/goals: pt gola is to work on "whatever is recommended."  [employer]: None Premorbid Home Care/DME Agencies: None Transportation available at discharge: TBD Is the patient able to respond to transportation needs?: Yes In the past 12 months, has lack of transportation kept you from medical appointments or from getting medications?: No In the past 12 months, has lack of transportation kept you from meetings, work, or from getting things needed for daily  living?: No Resource referrals  recommended: Neuropsychology  Discharge Planning Living Arrangements: Spouse/significant other Support Systems: Spouse/significant other, Children Type of Residence: Private residence Insurance Resources: Media planner (specify) ([employer] PPO) Financial Resources: Employment Financial Screen Referred: No Living Expenses: Banker Management: Patient, Spouse Does the patient have any problems obtaining your medications?: No Home Management: Pt reports his wife manages home care needs and meals Patient/Family Preliminary Plans: no changes Care Coordinator Barriers to Discharge: Decreased caregiver support, Lack of/limited family support, Insurance for SNF coverage Care Coordinator Anticipated Follow Up Needs: HH/OP Expected length of stay: 10-12 days  Clinical Impression SW met with pt at bedside to introduce self, explain role, and discuss discharge process. Pt is a veteran from his home country [place]. No HCPOA. No DME. SW will confirm there are no barriers to discharge.   Falesha Schommer A Alaura Schippers 10/22/2023, 1:51 PM

## 2023-10-22 NOTE — Care Management (Signed)
Inpatient Rehabilitation Center Individual Statement of Services  Patient Name:  Charles Dorsey  Date:  10/22/2023  Welcome to the Inpatient Rehabilitation Center.  Our goal is to provide you with an individualized program based on your diagnosis and situation, designed to meet your specific needs.  With this comprehensive rehabilitation program, you will be expected to participate in at least 3 hours of rehabilitation therapies Monday-Friday, with modified therapy programming on the weekends.  Your rehabilitation program will include the following services:  Physical Therapy (PT), Occupational Therapy (OT), Speech Therapy (ST), 24 hour per day rehabilitation nursing, Therapeutic Recreaction (TR), Psychology, Neuropsychology, Care Coordinator, Rehabilitation Medicine, Nutrition Services, Pharmacy Services, and Other  Weekly team conferences will be held on Tuesdays to discuss your progress.  Your Inpatient Rehabilitation Care Coordinator will talk with you frequently to get your input and to update you on team discussions.  Team conferences with you and your family in attendance may also be held.  Expected length of stay: 10-12 days    Overall anticipated outcome: Supervision  Depending on your progress and recovery, your program may change. Your Inpatient Rehabilitation Care Coordinator will coordinate services and will keep you informed of any changes. Your Inpatient Rehabilitation Care Coordinator's name and contact numbers are listed  below.  The following services may also be recommended but are not provided by the Inpatient Rehabilitation Center:  Driving Evaluations Home Health Rehabiltiation Services Outpatient Rehabilitation Services Vocational Rehabilitation   Arrangements will be made to provide these services after discharge if needed.  Arrangements include referral to agencies that provide these services.  Your insurance has been verified to be:  [employer] PPO  Your  primary doctor is:  [facility]  Pertinent information will be shared with your doctor and your insurance company.  Inpatient Rehabilitation Care Coordinator:  Susie Cassette [phone] or ([phone]  Information discussed with and copy given to patient by: Gretchen Short, 10/22/2023, 11:25 AM

## 2023-10-22 NOTE — Progress Notes (Signed)
PROGRESS NOTE   Subjective/Complaints:  Pt with ongoing headaches ("all over"), continuous and 7/10. Working on getting cleaned up and dressed with OT at sink side  ROS: Patient denies fever, rash, sore throat, blurred vision, dizziness, nausea, vomiting, diarrhea, cough, shortness of breath or chest pain, joint or back/neck pain,  or mood change.   Objective:   CT HEAD WO CONTRAST ( )  Result Date: 10/21/2023 CLINICAL DATA:  Traumatic brain injury (TBI), new or progressive neuro deficits EXAM: CT HEAD WITHOUT CONTRAST TECHNIQUE: Contiguous axial images were obtained from the base of the skull through the vertex without intravenous contrast. RADIATION DOSE REDUCTION: This exam was performed according to the departmental dose-optimization program which includes automated exposure control, adjustment of the mA and/or kV according to patient size and/or use of iterative reconstruction technique. COMPARISON:  CT Head 10/13/23 FINDINGS: Brain: There is an isodense right frontal convexity subdural hematoma measuring up to 5 mm. This previously measured 4 mm and was hyperdense. Interval decrease in size and conspicuity of the right frontal lobe hemorrhagic contusion. Interval decrease in the volume of subarachnoid hemorrhage along the right cerebral convexity. There is decreased leftward midline shift now measuring 3 mm, previously 7 mm. No evidence of intraventricular blood products. No hydrocephalus. Vascular: No hyperdense vessel or unexpected calcification. Skull: Redemonstrated left temporal bone fracture that extends into the parietal. Sinuses/Orbits: Small left mastoid and middle ear effusion paranasal sinuses are notable for frothy secretions in the right sphenoid sinus and mucosal thickening left sphenoid sinus. Orbits are unremarkable. Other: None. IMPRESSION: 1. Interval decrease in size and conspicuity of the right frontal lobe hemorrhagic  contusion. 2. Interval decrease in the volume of subarachnoid hemorrhage along the right cerebral convexity. 3. Interval decrease in leftward midline shift now measuring 3 mm, previously 7 mm. 4. Evolving right frontal convexity subdural hematoma measuring up to 5 mm. Electronically Signed   By: Lorenza Cambridge M.D.   On: 10/21/2023 16:43   Recent Labs    10/20/23 0508 10/22/23 0545  WBC 9.5 9.6  HGB 12.6* 12.2*  HCT 35.0* 35.0*  PLT 396 443*   Recent Labs    10/21/23 0522 10/22/23 0545  NA 129* 129*  K 4.3 4.0  CL 99 100  CO2 21* 23  GLUCOSE 162* 144*  BUN 19 27*  CREATININE 0.62 0.72  CALCIUM 9.2 8.8*    Intake/Output Summary (Last 24 hours) at 10/22/2023 1022 Last data filed at 10/22/2023 0939 Gross per 24 hour  Intake 596 ml  Output --  Net 596 ml        Physical Exam: Vital Signs Blood pressure 139/83, pulse 64, temperature 97.9 F (36.6 C), temperature source Oral, resp. rate 18, height 5\' 8"  (1.727 m), weight 62.3 kg, SpO2 100%. Constitutional: No distress . Vital signs reviewed. HEENT: NCAT, EOMI, oral membranes moist Neck: supple Cardiovascular: RRR without murmur. No JVD    Respiratory/Chest: CTA Bilaterally without wheezes or rales. Normal effort    GI/Abdomen: BS +, non-tender, non-distended Ext: no clubbing, cyanosis, or edema Psych: pleasant and cooperative  Skin: C/D/I. No apparent lesions. MSK:      No apparent deformity. Normal neck rom  Neurologic exam:  Cognition: AAO to person, place, time and event.  Very flat, perseverates at times. . Insight: Good  insight into current condition.  Mood: Pleasant affect, appropriate mood.  Sensation: To light touch reduced in distal RUE and RLE==stable CN:  Mild left facial weakness.  Strength: Antigravity and against resistance in all 4 extremities, --saw no focal weakness today  Assessment/Plan: 1. Functional deficits which require 3+ hours per day of interdisciplinary therapy in a comprehensive  inpatient rehab setting. Physiatrist is providing close team supervision and 24 hour management of active medical problems listed below. Physiatrist and rehab team continue to assess barriers to discharge/monitor patient progress toward functional and medical goals  Care Tool:  Bathing    Body parts bathed by patient: Right arm, Left arm, Chest, Abdomen, Front perineal area, Buttocks, Right upper leg, Left upper leg, Face, Right lower leg, Left lower leg         Bathing assist Assist Level: Supervision/Verbal cueing     Upper Body Dressing/Undressing Upper body dressing   What is the patient wearing?: Pull over shirt    Upper body assist Assist Level: Supervision/Verbal cueing    Lower Body Dressing/Undressing Lower body dressing      What is the patient wearing?: Pants, Underwear/pull up     Lower body assist Assist for lower body dressing: Supervision/Verbal cueing     Toileting Toileting    Toileting assist Assist for toileting: Moderate Assistance - Patient 50 - 74%     Transfers Chair/bed transfer  Transfers assist     Chair/bed transfer assist level: Contact Guard/Touching assist     Locomotion Ambulation   Ambulation assist      Assist level: Minimal Assistance - Patient > 75% Assistive device: No Device Max distance: 80ft   Walk 10 feet activity   Assist     Assist level: Minimal Assistance - Patient > 75% Assistive device: No Device   Walk 50 feet activity   Assist    Assist level: Minimal Assistance - Patient > 75% Assistive device: No Device    Walk 150 feet activity   Assist Walk 150 feet activity did not occur: Safety/medical concerns         Walk 10 feet on uneven surface  activity   Assist Walk 10 feet on uneven surfaces activity did not occur: Safety/medical concerns         Wheelchair     Assist Is the patient using a wheelchair?: Yes Type of Wheelchair: Manual    Wheelchair assist level:  Dependent - Patient 0%      Wheelchair 50 feet with 2 turns activity    Assist        Assist Level: Dependent - Patient 0%   Wheelchair 150 feet activity     Assist      Assist Level: Dependent - Patient 0%   Blood pressure 139/83, pulse 64, temperature 97.9 F (36.6 C), temperature source Oral, resp. rate 18, height 5\' 8"  (1.727 m), weight 62.3 kg, SpO2 100%.  1. Functional deficits secondary to TBI s/p fall with polytrauma             -patient may shower             -ELOS/Goals: 5-7 days, Mod I PT/OT, SLP SPV             -Continue CIR therapies including PT, OT, and SLP    2.  Antithrombotics: -DVT/anticoagulation:  Pharmaceutical: Lovenox             -  antiplatelet therapy: Off ASA due to TBI/bleed 3. Persistent headaches/Pain Management: Continue tylenol qid. Fioricet/oxycodone ineffective --start gabapentin 300 mg bid with 600 mg at nights for sleep and pain per discussion w/Dr. Shearon Stalls 11-23: Add Topamax 25 mg twice daily. 11-24: Increase gabapentin to 600 mg 3 times daily.  In afternoon, severe worsening of headache limiting therapies, repeat CT head stable/improved.  Increase Topamax to 50 mg twice daily for tomorrow. 11/25 gabapentin and topamax being dosed aggressively. Topamax increases today  -will try tramadol prn for headaches, has tried fioricet and oxy 4. Mood/Behavior/Sleep: LCSW to follow for evaluation and support.              -antipsychotic agents: N/A             --gabapentin 600 mg/hs for sleep and pain. Trazodone prn also on board.  5. Neuropsych/cognition: This patient may be intermittently capable of making decisions on his own behalf. 6. Skin/Wound Care: Routine pressure relief measures.  7. Fluids/Electrolytes/Nutrition: Daily BMET to monitor sodium. Continue 1200 cc FR             --change ensure to Ensure max to prevent surgar spikes. Will add prostat also  11-23: Wife bring in food from home, patient minimally eating hospital food, may  complicate picture.  Will provide education -good p.o.'s recorded.  11/25 po intake variable. Continue to encourage, food from home helps 8. FUO: Resolving. Attempt/encourage pulmonary hygiene with flutter valve qid             --leucocytosis on last CBC 11/15-->monitor for signs of infection and for rise in white count --recheck CBC in am.   -11-23: No recurrent fever, leukocytosis stable/resolved, monitor  10. Hyponatremia: Cerebral salt wasting?Marland Kitchen On Ure-Na since 11/20 and salt tabs 3 mg TID             --monitor with daily checks as continues to fluctuate--> 132-->134-->134-->132-->128--132             --po intake seems to be variable. Needs to be fed per chart review   11-23: NA 133, continue current regimen through Monday and if no worsening hyponatremia can start to wean salt tabs  11/24: Na 129 today; change Cozaar 15 mg daily to Norvasc 5 mg daily starting 11/25. Already on 0.5 mg/kg dosing for Ure-Na and 3 G TID salt tabs with fluid restriction.  Patient has been refusing urine powder due to taste; encouraged him to take and advised if continued downtrend may need more aggressive measures.   11/25 sodium holding at 129--no changes today 11. Pre-renal azotemia: Likely due to FR and dose of lasix before? -11/25 recurrent--push adequate fluids up to 1200cc. ?relax FR?   -recheck bmet tomorrow 12. Dizziness: Order vestibular evaluation. Check orthostatic vitals to monitor for drop with activity             --may need scopolamine patch is symptoms limiting and BP stable  11-23: Was extremely orthostatic on exam with PT and OT today, add TED hose and abdominal binder, may need addition of midodrine due to fluid restriction from hyponatremia  11-24: Reportedly much improved with therapies with binders and TED hose, with blood pressure into the 160s; maintained in the 140s with TED hose only.  13. HTN: Monitor BP TID--continue Cozaar and metoprolol. Will set hold parameters.    10/22/2023     7:18 AM 10/22/2023    4:09 AM 10/22/2023    4:08 AM  Vitals with BMI  Weight   137  lbs 6 oz  BMI   20.89  Systolic 139 119   Diastolic 83 70   Pulse 64 71    11-25 bp's controlled  14. T2DM: Monitor BS ac/hs. Hgb A1C- 8.4.  Po intake variable with fluctuating BS.              --continue Insulin glargline 10 units daily with SSI for elevated BS/tighter control.              --transition Insulin to metformin as intake improves. Ensure changed to ensure Max as lower carbs.   11-23: Difficult to track carbs due to wife bringing in food.  Will monitor trends today and adjust long-acting as necessary  11/24:BMP BUN/Cr looks good; intakes stable; add Metformin 500 mg BID and reduce semglee to 5 U for 11/25  11/25 cbg's fair--adjusting regimen as above--observe today Recent Labs    10/21/23 1659 10/21/23 2032 10/22/23 0611  GLUCAP 160* 147* 137*     15. CAD s/p PTCS/stent LAD: followed by Dr. Sherri Sear. Treated with metoprolol, Cozaar, ASA, lipitor             --monitor for symptoms with activity.  16. Abdominal pain: Likely due to GERD--had BM today --PPI resumed.    Senokot   1 tab twice daily due to constipation, last bowel movement 11/23  -also on miralax bid 17. Left TMJ fracture: No difficulty or pain with chewing. No facial pain or increase in weakness.  --Left facial weakness was noted by NS at admission. Monitor for now.  18. Seizure prophylaxis: Continue Keppra bid.  No seizures since admission.    LOS: 3 days A FACE TO FACE EVALUATION WAS PERFORMED  Ranelle Oyster 10/22/2023, 10:22 AM

## 2023-10-22 NOTE — Progress Notes (Signed)
Speech Language Pathology Daily Session Note  Patient Details  Name: Charles Dorsey MRN: [id number] Date of Birth: Jul 08, 1961  Today's Date: 10/22/2023 SLP Individual Time: 1345-1445 SLP Individual Time Calculation (min): 60 min  Short Term Goals: Week 1: SLP Short Term Goal 1 (Week 1): STG= LTG due to ELOS  Skilled Therapeutic Interventions: SLP conducted skilled therapy session targeting cognitive retraining goals. Interpreter present throughout to assist to communication. SLP guided patient through various tasks requiring working memory, sustained attention, emergent awareness, and scanning with patient requiring mod assist to complete overall. Patient appeared to have difficulty sustaining attention to task and benefited from frequent breaks and repetition of directions. Overall modA for sustained attention for 10 minutes bursts of time. Patient recalled events from the morning with min-modA. Patient disoriented to day of the week; provided patient with calendar to improve orientation and promote use of external aids. Patient was left in lowered bed with call bell in reach and bed alarm set. SLP will continue to target goals per plan of care.       Pain Pain Assessment Pain Scale: 0-10 Pain Score: 0-No pain Pain Type: Acute pain Pain Location: Head Pain Orientation: Right;Left Pain Descriptors / Indicators: Aching;Headache Pain Frequency: Constant Pain Onset: On-going Pain Intervention(s): Medication (See eMAR)  Therapy/Group: Individual Therapy  Jeannie Done, M.A., CCC-SLP  Yetta Barre 10/22/2023, 3:54 PM

## 2023-10-23 DIAGNOSIS — S069X9D Unspecified intracranial injury with loss of consciousness of unspecified duration, subsequent encounter: Secondary | ICD-10-CM

## 2023-10-23 DIAGNOSIS — S069X9S Unspecified intracranial injury with loss of consciousness of unspecified duration, sequela: Secondary | ICD-10-CM | POA: Diagnosis not present

## 2023-10-23 LAB — BASIC METABOLIC PANEL
Anion gap: 4 — ABNORMAL LOW (ref 5–15)
BUN: 18 mg/dL (ref 6–20)
CO2: 22 mmol/L (ref 22–32)
Calcium: 8.8 mg/dL — ABNORMAL LOW (ref 8.9–10.3)
Chloride: 106 mmol/L (ref 98–111)
Creatinine, Ser: 0.7 mg/dL (ref 0.61–1.24)
GFR, Estimated: >60 mL/min (ref >60)
Glucose, Bld: 162 mg/dL — ABNORMAL HIGH (ref 70–99)
Potassium: 3.8 mmol/L (ref 3.5–5.1)
Sodium: 132 mmol/L — ABNORMAL LOW (ref 135–145)

## 2023-10-23 LAB — GLUCOSE, CAPILLARY
Glucose-Capillary: 124 mg/dL — ABNORMAL HIGH (ref 70–99)
Glucose-Capillary: 210 mg/dL — ABNORMAL HIGH (ref 70–99)
Glucose-Capillary: 170 mg/dL — ABNORMAL HIGH (ref 70–99)

## 2023-10-23 MED ORDER — CIPROFLOXACIN-DEXAMETHASONE 0.3-0.1 % OT SUSP
4.0000 [drp] | Freq: Two times a day (BID) | OTIC | Status: DC
  Administered 2023-10-23 – 2023-10-25 (×4): 4 [drp] via OTIC
  Filled 2023-10-23: qty 7.5

## 2023-10-23 MED ORDER — METFORMIN HCL 500 MG PO TABS
1000.0000 mg | ORAL_TABLET | Freq: Two times a day (BID) | ORAL | Status: DC
  Administered 2023-10-23 – 2023-10-25 (×4): 1000 mg via ORAL
  Filled 2023-10-23 (×4): qty 2

## 2023-10-23 MED ORDER — GABAPENTIN 300 MG PO CAPS: 300.0000 mg | ORAL_CAPSULE | Freq: Three times a day (TID) | ORAL | Status: DC | PRN

## 2023-10-23 MED ORDER — TRAMADOL HCL 50 MG PO TABS
50.0000 mg | ORAL_TABLET | Freq: Four times a day (QID) | ORAL | Status: DC
  Administered 2023-10-23 – 2023-10-25 (×8): 50 mg via ORAL
  Filled 2023-10-23 (×8): qty 1

## 2023-10-23 MED ORDER — TRAMADOL HCL 50 MG PO TABS: 100.0000 mg | ORAL_TABLET | Freq: Three times a day (TID) | ORAL | Status: DC | PRN

## 2023-10-23 NOTE — Progress Notes (Signed)
Occupational Therapy Session Note  Patient Details  Name: Charles Dorsey MRN: [id number] Date of Birth: March 26, 1961  Today's Date: 10/23/2023 OT Individual Time: 800-845, 1415-1520 OT Individual Time Calculation (min): 45 min, 65 min (missed 10 min d/t fatigue)    Short Term Goals: Week 1:  OT Short Term Goal 1 (Week 1): Patient will maintain standing with CGA in preparation for BADL tasks OT Short Term Goal 2 (Week 1): Patient will ambulate into bathroom without device and CGA OT Short Term Goal 3 (Week 1): Pt will tolerate being awak and OOB for 1 hour after therapy session.  Skilled Therapeutic Interventions/Progress Updates:    Session 1:  Pt seen for early am session, lights off and pt resting bed level. Open to all presented activity but preferring full shower routine retraining later session with this clinician. Interpreter arrived for [place] to [place] interpretation during session. Pt transitioned for bed to EOB with no assistance, able to to remove LB garments for thigh high TEDS to be donned with S. OT donned TEDS for ease. MD arrived while OT ascertained baseline BP 100/66. Pt able to stand for oral care with close S. Repeated BP after 4 min standing with 108/78. Pt requested back to bed despite encouragement for OOB to recliner. Left with lights off, HOB elevated, needs and nurse call button with bed exit engaged.    Pain: 7/10 HA with minor relief with meds given, rest, hot beverage and breathing/distraction   Session 2:  Pt seen for 2nd OT session this day with handoff from PT in middle gym. Pt with decreased recall of discharge day info relayed. OT amb pt with close S back to pt's room in prep for shower. [place] Interpreter met at doorway. Pt requested brief rest prior to shower but then asked OT if shower could be tomorrow. OT agreed as pt to be discharged Thursday. OT addressed comprehensive UE HEP, balance at EOB sitting and standing as well as functional cognition. 1  lb cane ex with weighted bar scap, sh, elbow, forearm and wrist 10 reps x 2 sets (one seated and one standing set with close S). B red tband scap, sh, elbow 10 reps x 2 sets. Rests between sets. Heavy foam block had strengther 10 reps grasp and pinch Bly. Strategies for functional recall education. Pt request laying back in bed due to fatigue therefore missed 10 minutes at end of session. Will attempt to make up if able. Left pt bed level with all needs, nurse call button and bed alarm engaged.   Pain: 6/10 HA relief with hot beverage, rest, distraction  Therapy Documentation Precautions:  Precautions Precautions: Fall Restrictions Weight Bearing Restrictions: No   Therapy/Group: Individual Therapy  Vicenta Dunning 10/23/2023, 7:48 AM

## 2023-10-23 NOTE — Progress Notes (Addendum)
Patient ID: Charles Dorsey, male   DOB: 02/06/1961, 62 y.o.   MRN: [id number]  SW met with pt and [name] in room to provide updates from team conference, inform on d/c date 11/28, and discuss d/c recs- Outpatient PT/OT/SLP and shower chair. SW will send referral to [facility] ([phone]/f:[phone]) and shower chair with [employer] via parachute. Pt dtr confirms that she and mother will rotate their schedules. Plan for her mother to go to second shift, since she currently work first shift, and then they will swap off. She is unable to be present for family edu due to work schedule. SW informed medical team, and encouraged them to call if   SW ordered DME.   SW faxed outpatient referral.   Cecile Sheerer, MSW, LCSW Office: [phone] Cell: [phone] Fax: [phone]

## 2023-10-23 NOTE — Patient Care Conference (Signed)
Inpatient RehabilitationTeam Conference and Plan of Care Update Date: 10/23/2023   Time: 10:02 AM    Patient Name: Charles Dorsey      Medical Record Number: [id number]  Date of Birth: [birth year] Sex: Male         Room/Bed: 4W19C/4W19C-01 Payor Info: Payor: [employer] / Plan: [facility] PPO / Product Type: *No Product type* /    Admit Date/Time:  10/19/2023  2:57 PM  Primary Diagnosis:  TBI (traumatic brain injury) [facility])  Hospital Problems: Principal Problem:   TBI (traumatic brain injury) [facility])    Expected Discharge Date: Expected Discharge Date: 10/25/23  Team Members Present: Physician leading conference: Dr. Elijah Birk Social Worker Present: Cecile Sheerer, LCSWA Nurse Present: Vedia Pereyra, RN PT Present: Malachi Pro, PT OT Present: Valetta Fuller, OT SLP Present: Jeannie Done, SLP     Current Status/Progress Goal Weekly Team Focus  Bowel/Bladder   Pt is currently continent B/B LBM 10/23/23   Pt will retain continence   Assist pt with toileting needs qshift/prn    Swallow/Nutrition/ Hydration               ADL's   CGA for self care with min-mod cues for sequncing and safety, CGA for bathroom access with gait belt only and toilet and shower stall transfers, HA limits sessions but improved tolerance to gym access this session with OT after full AM routine, RO, appropriate conversation/humor and overall mental endurance progressing steadily   Supervision   HA and fatigue limits sessions, balance progression with falls prevention during ADL's and functional mobility, Family Educ need prior to d/c    Mobility   independent bed mobility, supervision transfers and ambulation up to 300'   supervision  DC prep, family ed, high level balance    Communication                Safety/Cognition/ Behavioral Observations  modA   supervision-minA   basic problem solving, sustained attention, basic recall    Pain   Pt verbalizes pain of 6/10  mainly headache. Pain being manage with scheduled pain medications   Pt will verbalizes pain level <3   Assess pt for pain qshift/prn and provide education regarding pharmacological/non pharcological means to relief pain    Skin   Pt skin is intact with no breakdown   Pt will maintain skin intergrity without breakdown  Assess skin and provide education to prevent skin breakdown      Discharge Planning:  Pt will d/c tohome with his wife and PRN support from dtr. Pt wife works but understands she will provide primary support. SW will confirm there are no barriers to discharge.   Team Discussion: TBI. Behavior Plan not needed. Time toileting.  Addressing constipation. Headaches main issue. Fioricet and oxycodone ineffective. Does endorse left ear feeling full but this is due to TMJ fracture. Sodium levels trending up. Continue Urea. BUN okay. Encouraging fluids. Abdominal binder and TEDs OOB. Family bringing in food from home.  1200 fluid restriction. Daily weights. AC/HS-Type I DM with on-going diet education.  Patient on target to meet rehab goals: Yes, progressing towards goals with discharge date of 10/25/23  *See Care Plan and progress notes for long and short-term goals.   Revisions to Treatment Plan:  Topamax and Tramadol added and gabapentin reduced due to N/V.  Monitor labs and VS Teaching Needs: Medications, safety, self care, diet modifications, smoking cessation, gait/transfer training, etc.   Current Barriers to Discharge: Decreased caregiver  support, Medication compliance, and Behavior  Possible Resolutions to Barriers: Family education Adheres to diet and medications modifications to improve health Improved sustained attention Order recommended DME     Medical Summary Current Status: Medically complicated by poor pain control with unremitting headaches, poor p.o. intakes, hyperglycemia, constipation, hyponatremia, orthostatic hypotension and prerenal azotemia   Barriers to Discharge: Electrolyte abnormality;Inadequate Nutritional Intake;Medical stability;Self-care education;Uncontrolled Diabetes;Uncontrolled Pain;Hypotension   Possible Resolutions to [employer] Focus: Aggressively uptitrating headache control medications, monitor daily bowel movements and adjust bowel regimen, monitor lab values and wean salt supplementation as necessary, adjusting diabetes regimen   Continued Need for Acute Rehabilitation Level of Care: The patient requires daily medical management by a physician with specialized training in physical medicine and rehabilitation for the following reasons: Direction of a multidisciplinary physical rehabilitation program to maximize functional independence : Yes Medical management of patient stability for increased activity during participation in an intensive rehabilitation regime.: Yes Analysis of laboratory values and/or radiology reports with any subsequent need for medication adjustment and/or medical intervention. : Yes   I attest that I was present, lead the team conference, and concur with the assessment and plan of the team.   Jearld Adjutant 10/24/2023, 9:47 AM

## 2023-10-23 NOTE — Progress Notes (Signed)
PROGRESS NOTE   Subjective/Complaints:  Ongoing unremitting headache. Some nausea and lethargy over the last few days, with dizziness.  Orthostatics borderline, improved after 5 minutes. Vitals stable.  NA uptrending, Bun normalized.  ROS: Patient denies fever, rash, sore throat, blurred vision,  vomiting, diarrhea, cough, shortness of breath or chest pain, joint or back/neck pain,  or mood change.   Objective:   CT HEAD WO CONTRAST ( )  Result Date: 10/21/2023 CLINICAL DATA:  Traumatic brain injury (TBI), new or progressive neuro deficits EXAM: CT HEAD WITHOUT CONTRAST TECHNIQUE: Contiguous axial images were obtained from the base of the skull through the vertex without intravenous contrast. RADIATION DOSE REDUCTION: This exam was performed according to the departmental dose-optimization program which includes automated exposure control, adjustment of the mA and/or kV according to patient size and/or use of iterative reconstruction technique. COMPARISON:  CT Head 10/13/23 FINDINGS: Brain: There is an isodense right frontal convexity subdural hematoma measuring up to 5 mm. This previously measured 4 mm and was hyperdense. Interval decrease in size and conspicuity of the right frontal lobe hemorrhagic contusion. Interval decrease in the volume of subarachnoid hemorrhage along the right cerebral convexity. There is decreased leftward midline shift now measuring 3 mm, previously 7 mm. No evidence of intraventricular blood products. No hydrocephalus. Vascular: No hyperdense vessel or unexpected calcification. Skull: Redemonstrated left temporal bone fracture that extends into the parietal. Sinuses/Orbits: Small left mastoid and middle ear effusion paranasal sinuses are notable for frothy secretions in the right sphenoid sinus and mucosal thickening left sphenoid sinus. Orbits are unremarkable. Other: None. IMPRESSION: 1. Interval decrease in size  and conspicuity of the right frontal lobe hemorrhagic contusion. 2. Interval decrease in the volume of subarachnoid hemorrhage along the right cerebral convexity. 3. Interval decrease in leftward midline shift now measuring 3 mm, previously 7 mm. 4. Evolving right frontal convexity subdural hematoma measuring up to 5 mm. Electronically Signed   By: Lorenza Cambridge M.D.   On: 10/21/2023 16:43   Recent Labs    10/22/23 0545  WBC 9.6  HGB 12.2*  HCT 35.0*  PLT 443*   Recent Labs    10/22/23 0545 10/23/23 0534  NA 129* 132*  K 4.0 3.8  CL 100 106  CO2 23 22  GLUCOSE 144* 162*  BUN 27* 18  CREATININE 0.72 0.70  CALCIUM 8.8* 8.8*    Intake/Output Summary (Last 24 hours) at 10/23/2023 1425 Last data filed at 10/23/2023 1000 Gross per 24 hour  Intake 360 ml  Output --  Net 360 ml        Physical Exam: Vital Signs Blood pressure 122/70, pulse 72, temperature 98 F (36.7 C), resp. rate 16, height 5\' 8"  (1.727 m), weight 62.3 kg, SpO2 98%. Constitutional: No distress . Vital signs reviewed. Sitting up with OT.  HEENT: NCAT, EOMI, oral membranes moist. No nystagmus on EOMI.  Neck: supple Cardiovascular: RRR without murmur. No JVD    Respiratory/Chest: CTA Bilaterally without wheezes or rales. Normal effort    GI/Abdomen: BS +, non-tender, non-distended Ext: no clubbing, cyanosis, or edema Psych: pleasant and cooperative  Skin: C/D/I. No apparent lesions. MSK:      No apparent  deformity. Normal neck rom. No TTP throughout neck and shoulders.        Neurologic exam:  Cognition: AAO to person, place, time and event.  Very flat, perseverates on headache.  Insight: Good  insight into current condition.  Mood: Pleasant affect, appropriate mood.  Sensation: To light touch reduced in distal RUE and RLE  CN:  Mild left facial weakness - resolved Strength: Antigravity and against resistance in all 4 extremities, 5/5 throughout  Assessment/Plan: 1. Functional deficits which require  3+ hours per day of interdisciplinary therapy in a comprehensive inpatient rehab setting. Physiatrist is providing close team supervision and 24 hour management of active medical problems listed below. Physiatrist and rehab team continue to assess barriers to discharge/monitor patient progress toward functional and medical goals  Care Tool:  Bathing    Body parts bathed by patient: Right arm, Left arm, Chest, Abdomen, Front perineal area, Buttocks, Right upper leg, Left upper leg, Face, Right lower leg, Left lower leg         Bathing assist Assist Level: Supervision/Verbal cueing     Upper Body Dressing/Undressing Upper body dressing   What is the patient wearing?: Pull over shirt    Upper body assist Assist Level: Supervision/Verbal cueing    Lower Body Dressing/Undressing Lower body dressing      What is the patient wearing?: Pants, Underwear/pull up     Lower body assist Assist for lower body dressing: Supervision/Verbal cueing     Toileting Toileting    Toileting assist Assist for toileting: Moderate Assistance - Patient 50 - 74%     Transfers Chair/bed transfer  Transfers assist     Chair/bed transfer assist level: Contact Guard/Touching assist     Locomotion Ambulation   Ambulation assist      Assist level: Contact Guard/Touching assist Assistive device: No Device Max distance: 150'   Walk 10 feet activity   Assist     Assist level: Supervision/Verbal cueing Assistive device: No Device   Walk 50 feet activity   Assist    Assist level: Contact Guard/Touching assist Assistive device: No Device    Walk 150 feet activity   Assist Walk 150 feet activity did not occur: Safety/medical concerns  Assist level: Contact Guard/Touching assist Assistive device: No Device    Walk 10 feet on uneven surface  activity   Assist Walk 10 feet on uneven surfaces activity did not occur: Safety/medical concerns          Wheelchair     Assist Is the patient using a wheelchair?: Yes Type of Wheelchair: Manual    Wheelchair assist level: Dependent - Patient 0%      Wheelchair 50 feet with 2 turns activity    Assist        Assist Level: Dependent - Patient 0%   Wheelchair 150 feet activity     Assist      Assist Level: Dependent - Patient 0%   Blood pressure 122/70, pulse 72, temperature 98 F (36.7 C), resp. rate 16, height 5\' 8"  (1.727 m), weight 62.3 kg, SpO2 98%.  1. Functional deficits secondary to TBI s/p fall with polytrauma             -patient may shower             -ELOS/Goals: 5-7 days, Mod I PT/OT, SLP SPV - DC 11/28             -Continue CIR therapies including PT, OT, and SLP   - 11/26:  Supposed to go home with wife and support form daughters and sons; progressing to close supervision today. Limited by headache, but easily distracted from it.  PT already supervision overall. SLP sustained attention an issue, Mod A currently.    2.  Antithrombotics: -DVT/anticoagulation:  Pharmaceutical: Lovenox             -antiplatelet therapy: Off ASA due to TBI/bleed 3. Persistent headaches/Pain Management: Continue tylenol qid. Fioricet/oxycodone ineffective --start gabapentin 300 mg bid with 600 mg at nights for sleep and pain per discussion w/Dr. Shearon Stalls 11-23: Add Topamax 25 mg twice daily. 11-24: Increase gabapentin to 600 mg 3 times daily.  In afternoon, severe worsening of headache limiting therapies, repeat CT head stable/improved.  Increase Topamax to 50 mg twice daily for tomorrow. 11/25 gabapentin and topamax being dosed aggressively. Topamax increases today  -will try tramadol prn for headaches, has tried fioricet and oxy 11.26: Gabapentin to 300 mg TID PRN and tramadol scheduled Q6H   4. Mood/Behavior/Sleep: LCSW to follow for evaluation and support.              -antipsychotic agents: N/A             --gabapentin 600 mg/hs for sleep and pain. Trazodone prn also  on board.  5. Neuropsych/cognition: This patient may be intermittently capable of making decisions on his own behalf. 6. Skin/Wound Care: Routine pressure relief measures.  7. Fluids/Electrolytes/Nutrition: Daily BMET to monitor sodium. Continue 1200 cc FR             --change ensure to Ensure max to prevent surgar spikes. Will add prostat also  11-23: Wife bring in food from home, patient minimally eating hospital food, may complicate picture.  Will provide education -good p.o.'s recorded.  11/25 po intake variable. Continue to encourage, food from home helps  8. FUO: Resolving. Attempt/encourage pulmonary hygiene with flutter valve qid             --leucocytosis on last CBC 11/15-->monitor for signs of infection and for rise in white count --recheck CBC in am.   -11-23: No recurrent fever, leukocytosis stable/resolved, monitor  10. Hyponatremia: Cerebral salt wasting?Marland Kitchen On Ure-Na since 11/20 and salt tabs 3 mg TID             --monitor with daily checks as continues to fluctuate--> 132-->134-->134-->132-->128--132             --po intake seems to be variable. Needs to be fed per chart review   11-23: NA 133, continue current regimen through Monday and if no worsening hyponatremia can start to wean salt tabs  11/24: Na 129 today; change Cozaar 15 mg daily to Norvasc 5 mg daily starting 11/25. Already on 0.5 mg/kg dosing for Ure-Na and 3 G TID salt tabs with fluid restriction.  Patient has been refusing urine powder due to taste; encouraged him to take and advised if continued downtrend may need more aggressive measures.   11/25 sodium holding at 129--no changes today  11-26: Sodium 132; improving.  Continue current regimen until normalized  11. Pre-renal azotemia: Likely due to FR and dose of lasix before? -11/25 recurrent--push adequate fluids up to 1200cc. ?relax FR?   -recheck bmet tomorrow 11-26: BUN and creatinine normal  12. Dizziness: Order vestibular evaluation. Check orthostatic  vitals to monitor for drop with activity             --may need scopolamine patch is symptoms limiting and BP stable  11-23: Was extremely orthostatic on  exam with PT and OT today, add TED hose and abdominal binder, may need addition of midodrine due to fluid restriction from hyponatremia  11-24: Reportedly much improved with therapies with binders and TED hose, with blood pressure into the 160s; maintained in the 140s with TED hose only.  11/26: Reduce gabapentin to 300 mg TID PRN due to dizziness, lethargy. Orthostats still good with TEDs,   13. HTN: Monitor BP TID--continue Cozaar and metoprolol. Will set hold parameters.    10/23/2023   12:56 PM 10/23/2023    4:52 AM 10/22/2023    7:56 PM  Vitals with BMI  Systolic 122 118 102  Diastolic 70 65 61  Pulse 72 69 76   11-25 bp's controlled  14. T2DM: Monitor BS ac/hs. Hgb A1C- 8.4.  Po intake variable with fluctuating BS.              --continue Insulin glargline 10 units daily with SSI for elevated BS/tighter control.              --transition Insulin to metformin as intake improves. Ensure changed to ensure Max as lower carbs.   11-23: Difficult to track carbs due to wife bringing in food.  Will monitor trends today and adjust long-acting as necessary  11/24:BMP BUN/Cr looks good; intakes stable; add Metformin 500 mg BID and reduce semglee to 5 U for 11/25  11/25 cbg's fair--adjusting regimen as above--observe today  11-26: BUN/creatinine with good control.  DC Semglee.  Increase metformin to 1000 mg twice daily. Recent Labs    10/22/23 1638 10/22/23 2034 10/23/23 0611  GLUCAP 155* 171* 124*     15. CAD s/p PTCS/stent LAD: followed by Dr. Sherri Sear. Treated with metoprolol, Cozaar, ASA, lipitor             --monitor for symptoms with activity.  16. Abdominal pain: Likely due to GERD--had BM today --PPI resumed.    Senokot   1 tab twice daily due to constipation, last bowel movement 11/23  -also on miralax bid Last bowel  movement medium, type II, 11-25  17. Left TMJ fracture: No difficulty or pain with chewing. No facial pain or increase in weakness.  --Left facial weakness was noted by NS at admission. Monitor for now.   - 11/26: Endorsing hearing deficits post-trauma, OP f/u ENT Dr. Elijah Birk, resume ciprodex BID  18. Seizure prophylaxis: Continue Keppra bid.  No seizures since admission.    LOS: 4 days A FACE TO FACE EVALUATION WAS PERFORMED  Angelina Sheriff 10/23/2023, 2:25 PM

## 2023-10-23 NOTE — Progress Notes (Signed)
PROGRESS NOTE   Subjective/Complaints:  Ongoing unremitting headache. Some nausea and lethargy over the last few days, with dizziness.  Orthostatics borderline, improved after 5 minutes. Vitals stable.  NA uptrending, Bun normalized.  ROS: Patient denies fever, rash, sore throat, blurred vision,  vomiting, diarrhea, cough, shortness of breath or chest pain, joint or back/neck pain,  or mood change.   Objective:   CT HEAD WO CONTRAST ( )  Result Date: 10/21/2023 CLINICAL DATA:  Traumatic brain injury (TBI), new or progressive neuro deficits EXAM: CT HEAD WITHOUT CONTRAST TECHNIQUE: Contiguous axial images were obtained from the base of the skull through the vertex without intravenous contrast. RADIATION DOSE REDUCTION: This exam was performed according to the departmental dose-optimization program which includes automated exposure control, adjustment of the mA and/or kV according to patient size and/or use of iterative reconstruction technique. COMPARISON:  CT Head 10/13/23 FINDINGS: Brain: There is an isodense right frontal convexity subdural hematoma measuring up to 5 mm. This previously measured 4 mm and was hyperdense. Interval decrease in size and conspicuity of the right frontal lobe hemorrhagic contusion. Interval decrease in the volume of subarachnoid hemorrhage along the right cerebral convexity. There is decreased leftward midline shift now measuring 3 mm, previously 7 mm. No evidence of intraventricular blood products. No hydrocephalus. Vascular: No hyperdense vessel or unexpected calcification. Skull: Redemonstrated left temporal bone fracture that extends into the parietal. Sinuses/Orbits: Small left mastoid and middle ear effusion paranasal sinuses are notable for frothy secretions in the right sphenoid sinus and mucosal thickening left sphenoid sinus. Orbits are unremarkable. Other: None. IMPRESSION: 1. Interval decrease in size  and conspicuity of the right frontal lobe hemorrhagic contusion. 2. Interval decrease in the volume of subarachnoid hemorrhage along the right cerebral convexity. 3. Interval decrease in leftward midline shift now measuring 3 mm, previously 7 mm. 4. Evolving right frontal convexity subdural hematoma measuring up to 5 mm. Electronically Signed   By: Lorenza Cambridge M.D.   On: 10/21/2023 16:43   Recent Labs    10/22/23 0545  WBC 9.6  HGB 12.2*  HCT 35.0*  PLT 443*   Recent Labs    10/22/23 0545 10/23/23 0534  NA 129* 132*  K 4.0 3.8  CL 100 106  CO2 23 22  GLUCOSE 144* 162*  BUN 27* 18  CREATININE 0.72 0.70  CALCIUM 8.8* 8.8*    Intake/Output Summary (Last 24 hours) at 10/23/2023 0801 Last data filed at 10/22/2023 1800 Gross per 24 hour  Intake 480 ml  Output --  Net 480 ml        Physical Exam: Vital Signs Blood pressure 118/65, pulse 69, temperature 98.2 F (36.8 C), resp. rate 16, height 5\' 8"  (1.727 m), weight 62.3 kg, SpO2 100%. Constitutional: No distress . Vital signs reviewed. Sitting up with OT.  HEENT: NCAT, EOMI, oral membranes moist. No nystagmus on EOMI.  Neck: supple Cardiovascular: RRR without murmur. No JVD    Respiratory/Chest: CTA Bilaterally without wheezes or rales. Normal effort    GI/Abdomen: BS +, non-tender, non-distended Ext: no clubbing, cyanosis, or edema Psych: pleasant and cooperative  Skin: C/D/I. No apparent lesions. MSK:      No apparent  deformity. Normal neck rom. No TTP throughout neck and shoulders.        Neurologic exam:  Cognition: AAO to person, place, time and event.  Very flat, perseverates on headache.  Insight: Good  insight into current condition.  Mood: Pleasant affect, appropriate mood.  Sensation: To light touch reduced in distal RUE and RLE  CN:  Mild left facial weakness - resolved Strength: Antigravity and against resistance in all 4 extremities, 5/5 throughout  Assessment/Plan: 1. Functional deficits which  require 3+ hours per day of interdisciplinary therapy in a comprehensive inpatient rehab setting. Physiatrist is providing close team supervision and 24 hour management of active medical problems listed below. Physiatrist and rehab team continue to assess barriers to discharge/monitor patient progress toward functional and medical goals  Care Tool:  Bathing    Body parts bathed by patient: Right arm, Left arm, Chest, Abdomen, Front perineal area, Buttocks, Right upper leg, Left upper leg, Face, Right lower leg, Left lower leg         Bathing assist Assist Level: Supervision/Verbal cueing     Upper Body Dressing/Undressing Upper body dressing   What is the patient wearing?: Pull over shirt    Upper body assist Assist Level: Supervision/Verbal cueing    Lower Body Dressing/Undressing Lower body dressing      What is the patient wearing?: Pants, Underwear/pull up     Lower body assist Assist for lower body dressing: Supervision/Verbal cueing     Toileting Toileting    Toileting assist Assist for toileting: Moderate Assistance - Patient 50 - 74%     Transfers Chair/bed transfer  Transfers assist     Chair/bed transfer assist level: Contact Guard/Touching assist     Locomotion Ambulation   Ambulation assist      Assist level: Minimal Assistance - Patient > 75% Assistive device: No Device Max distance: 45ft   Walk 10 feet activity   Assist     Assist level: Minimal Assistance - Patient > 75% Assistive device: No Device   Walk 50 feet activity   Assist    Assist level: Minimal Assistance - Patient > 75% Assistive device: No Device    Walk 150 feet activity   Assist Walk 150 feet activity did not occur: Safety/medical concerns         Walk 10 feet on uneven surface  activity   Assist Walk 10 feet on uneven surfaces activity did not occur: Safety/medical concerns         Wheelchair     Assist Is the patient using a  wheelchair?: Yes Type of Wheelchair: Manual    Wheelchair assist level: Dependent - Patient 0%      Wheelchair 50 feet with 2 turns activity    Assist        Assist Level: Dependent - Patient 0%   Wheelchair 150 feet activity     Assist      Assist Level: Dependent - Patient 0%   Blood pressure 118/65, pulse 69, temperature 98.2 F (36.8 C), resp. rate 16, height 5\' 8"  (1.727 m), weight 62.3 kg, SpO2 100%.  1. Functional deficits secondary to TBI s/p fall with polytrauma             -patient may shower             -ELOS/Goals: 5-7 days, Mod I PT/OT, SLP SPV - DC 11/28             -Continue CIR therapies including PT, OT, and  SLP   - 11/26: Supposed to go home with wife and support form daughters and sons; progressing to close supervision today. Limited by headache, but easily distracted from it.  PT already supervision overall. SLP sustained attention an issue, Mod A currently.    2.  Antithrombotics: -DVT/anticoagulation:  Pharmaceutical: Lovenox             -antiplatelet therapy: Off ASA due to TBI/bleed 3. Persistent headaches/Pain Management: Continue tylenol qid. Fioricet/oxycodone ineffective --start gabapentin 300 mg bid with 600 mg at nights for sleep and pain per discussion w/Dr. Shearon Stalls 11-23: Add Topamax 25 mg twice daily. 11-24: Increase gabapentin to 600 mg 3 times daily.  In afternoon, severe worsening of headache limiting therapies, repeat CT head stable/improved.  Increase Topamax to 50 mg twice daily for tomorrow. 11/25 gabapentin and topamax being dosed aggressively. Topamax increases today  -will try tramadol prn for headaches, has tried fioricet and oxy 11.26: Gabapentin to 300 mg TID PRN and tramadol scheduled Q6H   4. Mood/Behavior/Sleep: LCSW to follow for evaluation and support.              -antipsychotic agents: N/A             --gabapentin 600 mg/hs for sleep and pain. Trazodone prn also on board.  5. Neuropsych/cognition: This patient  may be intermittently capable of making decisions on his own behalf. 6. Skin/Wound Care: Routine pressure relief measures.  7. Fluids/Electrolytes/Nutrition: Daily BMET to monitor sodium. Continue 1200 cc FR             --change ensure to Ensure max to prevent surgar spikes. Will add prostat also  11-23: Wife bring in food from home, patient minimally eating hospital food, may complicate picture.  Will provide education -good p.o.'s recorded.  11/25 po intake variable. Continue to encourage, food from home helps  8. FUO: Resolving. Attempt/encourage pulmonary hygiene with flutter valve qid             --leucocytosis on last CBC 11/15-->monitor for signs of infection and for rise in white count --recheck CBC in am.   -11-23: No recurrent fever, leukocytosis stable/resolved, monitor  10. Hyponatremia: Cerebral salt wasting?Marland Kitchen On Ure-Na since 11/20 and salt tabs 3 mg TID             --monitor with daily checks as continues to fluctuate--> 132-->134-->134-->132-->128--132             --po intake seems to be variable. Needs to be fed per chart review   11-23: NA 133, continue current regimen through Monday and if no worsening hyponatremia can start to wean salt tabs  11/24: Na 129 today; change Cozaar 15 mg daily to Norvasc 5 mg daily starting 11/25. Already on 0.5 mg/kg dosing for Ure-Na and 3 G TID salt tabs with fluid restriction.  Patient has been refusing urine powder due to taste; encouraged him to take and advised if continued downtrend may need more aggressive measures.   11/25 sodium holding at 129--no changes today  11-26: Sodium 132; improving.  Continue current regimen until normalized  11. Pre-renal azotemia: Likely due to FR and dose of lasix before? -11/25 recurrent--push adequate fluids up to 1200cc. ?relax FR?   -recheck bmet tomorrow 11-26: BUN and creatinine normal  12. Dizziness: Order vestibular evaluation. Check orthostatic vitals to monitor for drop with activity              --may need scopolamine patch is symptoms limiting and BP stable  11-23: Was extremely orthostatic on exam with PT and OT today, add TED hose and abdominal binder, may need addition of midodrine due to fluid restriction from hyponatremia  11-24: Reportedly much improved with therapies with binders and TED hose, with blood pressure into the 160s; maintained in the 140s with TED hose only.  11/26: Reduce gabapentin to 300 mg TID PRN due to dizziness, lethargy. Orthostats still good with TEDs,   13. HTN: Monitor BP TID--continue Cozaar and metoprolol. Will set hold parameters.    10/23/2023    4:52 AM 10/22/2023    7:56 PM 10/22/2023    3:27 PM  Vitals with BMI  Systolic 118 103 829  Diastolic 65 61 57  Pulse 69 76 65   11-25 bp's controlled  14. T2DM: Monitor BS ac/hs. Hgb A1C- 8.4.  Po intake variable with fluctuating BS.              --continue Insulin glargline 10 units daily with SSI for elevated BS/tighter control.              --transition Insulin to metformin as intake improves. Ensure changed to ensure Max as lower carbs.   11-23: Difficult to track carbs due to wife bringing in food.  Will monitor trends today and adjust long-acting as necessary  11/24:BMP BUN/Cr looks good; intakes stable; add Metformin 500 mg BID and reduce semglee to 5 U for 11/25  11/25 cbg's fair--adjusting regimen as above--observe today  11-26: BUN/creatinine with good control.  DC Semglee.  Increase metformin to 1000 mg twice daily. Recent Labs    10/22/23 1638 10/22/23 2034 10/23/23 0611  GLUCAP 155* 171* 124*     15. CAD s/p PTCS/stent LAD: followed by Dr. Sherri Sear. Treated with metoprolol, Cozaar, ASA, lipitor             --monitor for symptoms with activity.  16. Abdominal pain: Likely due to GERD--had BM today --PPI resumed.    Senokot   1 tab twice daily due to constipation, last bowel movement 11/23  -also on miralax bid Last bowel movement medium, type II, 11-25  17. Left TMJ  fracture: No difficulty or pain with chewing. No facial pain or increase in weakness.  --Left facial weakness was noted by NS at admission. Monitor for now.   - 11/26: Endorsing hearing deficits post-trauma, OP f/u ENT Dr. Elijah Birk, resume ciprodex BID  18. Seizure prophylaxis: Continue Keppra bid.  No seizures since admission.    LOS: 4 days A FACE TO FACE EVALUATION WAS PERFORMED  Angelina Sheriff 10/23/2023, 8:01 AM

## 2023-10-23 NOTE — Progress Notes (Signed)
Physical Therapy Session Note  Patient Details  Name: Charles Dorsey MRN: [id number] Date of Birth: 1961-07-14  Today's Date: 10/23/2023 PT Individual Time: 0905-0959 PT Individual Time Calculation (min): 54 min   Short Term Goals: Week 1:  PT Short Term Goal 1 (Week 1): Pt will perform supine<>sit mod-I PT Short Term Goal 2 (Week 1): Pt will perform bed<>chair transfers using LRAD with supervision PT Short Term Goal 3 (Week 1): Pt will ambulate at least 171ft using LRAD with CGA PT Short Term Goal 4 (Week 1): Pt will navigate at least 8 steps using HRs with CGA PT Short Term Goal 5 (Week 1): Pt will participate in standardized outcome measure to asses fall risk  Skilled Therapeutic Interventions/Progress Updates:     Pt received supine in bed and agrees to therapy. Reports 6-7/10 headache pain. Pt states that pain is constant and does not get better or worse. PT alerts RN regarding pain and provides pt with rest breaks throughout session to manage. Pt performs supine to sit independently. Pt dons shoes at EOB with setup assistance. Pt stands with cues for initiation and ambulates x300' without AD, with close supervision and cues to increase gait speed to improve balance and decrease risk for falls. PT has discussion with pt regarding current functional status and whether pt feels he is safe for discharge. Pt reports that he feels like he could go home safely, and has no concerns other than headache and feeling slightly unsteady when walking. Following rest break, pt ambulates x300' with same assistance and cues. Pt completes Nustep for endurance training as well as reciprocal coordination and attention to task challenge. Pt completes x10:00 at workload of 5 with average steps per minute ~35. PT provides cues for hand and foot placement and completing full available ROM.   Pt completes alternating foot taps on 3.5" step, x10 with RLE and x10 with LLE. Following rest break, activity progressed by  having pt perform step ups with alternating legs, x10 with each. PT provides close supervision and cues to utilize upright and stable gaze to promote optimal balance. Pt ambulates back to room. Pt performs toilet transfer with cues for positioning and use of grab bar. Following, pt ambulates to bed with cues for positioning. Left supine with alarm intact and all needs within reach.   Therapy Documentation Precautions:  Precautions Precautions: Fall Restrictions Weight Bearing Restrictions: No   Therapy/Group: Individual Therapy  Beau Fanny, PT, DPT 10/23/2023, 3:56 PM

## 2023-10-23 NOTE — Progress Notes (Signed)
Physical Therapy TBI Note  Patient Details  Name: Charles Dorsey MRN: [id number] Date of Birth: 1961-09-08  Today's Date: 10/23/2023 PT Individual Time: 1345-1415 PT Individual Time Calculation (min): 30 min   Short Term Goals: Week 1:  PT Short Term Goal 1 (Week 1): Pt will perform supine<>sit mod-I PT Short Term Goal 2 (Week 1): Pt will perform bed<>chair transfers using LRAD with supervision PT Short Term Goal 3 (Week 1): Pt will ambulate at least 182ft using LRAD with CGA PT Short Term Goal 4 (Week 1): Pt will navigate at least 8 steps using HRs with CGA PT Short Term Goal 5 (Week 1): Pt will participate in standardized outcome measure to asses fall risk   Skilled Therapeutic Interventions/Progress Updates:    Pt presents in bed and agreeable to session despite headache that is ongoing. Pt overall close supervision to occasional CGA needed for mild LOB during functional mobility training on unit without device > 150'. Administered DGI (see results) below for fall risk assessment. Stair training with Monitored BP after activity (see below) - pt not reporting increase or changes in symptoms.Pt requires extra time for processing and multi step commands. Hand off to OT at end of session.   Therapy Documentation Precautions:  Precautions Precautions: Fall Restrictions Weight Bearing Restrictions: No   Vital Signs: Seated after activity BP = 105/56 mmHg; HR = 75 bpm Standing 1 min = 86/54 mmHg; HR = 80 bpm Standing after 2 min = 88/53 mmHg; Reports no change in symptoms Pain: Pain Assessment Pain Scale: 0-10 Pain Score: 7  Pain Type: Acute pain Pain Location: Head No intervention needed at this time.   Agitated Behavior Scale: TBI Observation Details Observation Environment: therapy Start of observation period - Date: 10/23/23 Start of observation period - Time: 1345 End of observation period - Date: 10/23/23 End of observation period - Time: 1415 Agitated Behavior Scale (DO  NOT LEAVE BLANKS) Short attention span, easy distractibility, inability to concentrate: Absent Impulsive, impatient, low tolerance for pain or frustration: Absent Uncooperative, resistant to care, demanding: Absent Violent and/or threatening violence toward people or property: Absent Explosive and/or unpredictable anger: Absent Rocking, rubbing, moaning, or other self-stimulating behavior: Absent Pulling at tubes, restraints, etc.: Absent Wandering from treatment areas: Absent Restlessness, pacing, excessive movement: Absent Repetitive behaviors, motor, and/or verbal: Absent Rapid, loud, or excessive talking: Absent Sudden changes of mood: Absent Easily initiated or excessive crying and/or laughter: Absent Self-abusiveness, physical and/or verbal: Absent Agitated behavior scale total score: 14  Balance: Dynamic Gait Index Level Surface: Mild Impairment Change in Gait Speed: Mild Impairment Gait with Horizontal Head Turns: Mild Impairment Gait with Vertical Head Turns: Mild Impairment Gait and Pivot Turn: Moderate Impairment Step Over Obstacle: Moderate Impairment Step Around Obstacles: Moderate Impairment Steps: Mild Impairment Total Score: 13    Therapy/Group: Individual Therapy  Karolee Stamps Darrol Poke, PT, DPT, CBIS  10/23/2023, 2:24 PM

## 2023-10-24 ENCOUNTER — Other Ambulatory Visit (HOSPITAL_COMMUNITY): Payer: Self-pay

## 2023-10-24 DIAGNOSIS — E871 Hypo-osmolality and hyponatremia: Secondary | ICD-10-CM | POA: Insufficient documentation

## 2023-10-24 DIAGNOSIS — E119 Type 2 diabetes mellitus without complications: Secondary | ICD-10-CM

## 2023-10-24 DIAGNOSIS — R519 Headache, unspecified: Secondary | ICD-10-CM | POA: Insufficient documentation

## 2023-10-24 DIAGNOSIS — D62 Acute posthemorrhagic anemia: Secondary | ICD-10-CM | POA: Insufficient documentation

## 2023-10-24 DIAGNOSIS — S069X9S Unspecified intracranial injury with loss of consciousness of unspecified duration, sequela: Secondary | ICD-10-CM | POA: Diagnosis not present

## 2023-10-24 LAB — BASIC METABOLIC PANEL
Anion gap: 7 (ref 5–15)
BUN: 19 mg/dL (ref 6–20)
CO2: 22 mmol/L (ref 22–32)
Calcium: 9.2 mg/dL (ref 8.9–10.3)
Chloride: 105 mmol/L (ref 98–111)
Creatinine, Ser: 0.62 mg/dL (ref 0.61–1.24)
GFR, Estimated: >60 mL/min (ref >60)
Glucose, Bld: 122 mg/dL — ABNORMAL HIGH (ref 70–99)
Potassium: 3.9 mmol/L (ref 3.5–5.1)
Sodium: 134 mmol/L — ABNORMAL LOW (ref 135–145)

## 2023-10-24 LAB — GLUCOSE, CAPILLARY
Glucose-Capillary: 114 mg/dL — ABNORMAL HIGH (ref 70–99)
Glucose-Capillary: 94 mg/dL (ref 70–99)
Glucose-Capillary: 122 mg/dL — ABNORMAL HIGH (ref 70–99)
Glucose-Capillary: 154 mg/dL — ABNORMAL HIGH (ref 70–99)

## 2023-10-24 MED ORDER — LOPERAMIDE HCL 2 MG PO CAPS: 2.0000 mg | ORAL_CAPSULE | ORAL | Status: DC | PRN

## 2023-10-24 MED ORDER — ADULT MULTIVITAMIN W/MINERALS CH
1.0000 | ORAL_TABLET | Freq: Every day | ORAL | 0 refills | Status: AC
  Filled 2023-10-24: qty 30, 30d supply, fill #0

## 2023-10-24 MED ORDER — ACETAMINOPHEN 500 MG PO TABS
1000.0000 mg | ORAL_TABLET | Freq: Three times a day (TID) | ORAL | 0 refills | Status: AC
  Filled 2023-10-24: qty 200, 34d supply, fill #0

## 2023-10-24 MED ORDER — CIPROFLOXACIN-DEXAMETHASONE 0.3-0.1 % OT SUSP
4.0000 [drp] | Freq: Two times a day (BID) | OTIC | 0 refills | Status: AC
  Filled 2023-10-24: qty 7.5, 19d supply, fill #0

## 2023-10-24 MED ORDER — POLYETHYLENE GLYCOL 3350 17 GM/SCOOP PO POWD
17.0000 g | Freq: Two times a day (BID) | ORAL | 0 refills | Status: DC
  Filled 2023-10-24: qty 238, 7d supply, fill #0

## 2023-10-24 MED ORDER — FOLIC ACID 1 MG PO TABS
1.0000 mg | ORAL_TABLET | Freq: Every day | ORAL | 0 refills | Status: AC
  Filled 2023-10-24: qty 30, 30d supply, fill #0

## 2023-10-24 MED ORDER — NAPHAZOLINE-GLYCERIN 0.012-0.25 % OP SOLN
2.0000 [drp] | Freq: Four times a day (QID) | OPHTHALMIC | 0 refills | Status: AC
  Filled 2023-10-24: qty 30, 75d supply, fill #0

## 2023-10-24 MED ORDER — DICLOFENAC SODIUM 1 % EX GEL
2.0000 g | Freq: Four times a day (QID) | CUTANEOUS | 0 refills | Status: AC
  Filled 2023-10-24: qty 300, 30d supply, fill #0

## 2023-10-24 MED ORDER — TRAMADOL HCL 50 MG PO TABS
50.0000 mg | ORAL_TABLET | Freq: Four times a day (QID) | ORAL | 0 refills | Status: AC
  Filled 2023-10-24: qty 20, 5d supply, fill #0

## 2023-10-24 MED ORDER — MELATONIN 5 MG PO TABS
5.0000 mg | ORAL_TABLET | Freq: Every evening | ORAL | 0 refills | Status: AC | PRN
  Filled 2023-10-24: qty 30, 30d supply, fill #0

## 2023-10-24 MED ORDER — LEVETIRACETAM 500 MG PO TABS
500.0000 mg | ORAL_TABLET | Freq: Two times a day (BID) | ORAL | 0 refills | Status: AC
  Filled 2023-10-24: qty 60, 30d supply, fill #0

## 2023-10-24 MED ORDER — PANTOPRAZOLE SODIUM 20 MG PO TBEC
20.0000 mg | DELAYED_RELEASE_TABLET | Freq: Every day | ORAL | 0 refills | Status: AC
  Filled 2023-10-24: qty 30, 30d supply, fill #0

## 2023-10-24 MED ORDER — SENNOSIDES-DOCUSATE SODIUM 8.6-50 MG PO TABS
1.0000 | ORAL_TABLET | Freq: Two times a day (BID) | ORAL | 0 refills | Status: AC
  Filled 2023-10-24: qty 60, 30d supply, fill #0

## 2023-10-24 MED ORDER — TOPIRAMATE 50 MG PO TABS
50.0000 mg | ORAL_TABLET | Freq: Two times a day (BID) | ORAL | 0 refills | Status: DC
  Filled 2023-10-24: qty 60, 30d supply, fill #0

## 2023-10-24 MED ORDER — METOPROLOL SUCCINATE ER 50 MG PO TB24
50.0000 mg | ORAL_TABLET | Freq: Every day | ORAL | 0 refills | Status: AC
  Filled 2023-10-24: qty 30, 30d supply, fill #0

## 2023-10-24 MED ORDER — AMLODIPINE BESYLATE 5 MG PO TABS
5.0000 mg | ORAL_TABLET | Freq: Every day | ORAL | 0 refills | Status: DC
  Filled 2023-10-24: qty 30, 30d supply, fill #0

## 2023-10-24 MED ORDER — METFORMIN HCL 1000 MG PO TABS
1000.0000 mg | ORAL_TABLET | Freq: Two times a day (BID) | ORAL | 0 refills | Status: AC
  Filled 2023-10-24: qty 60, 30d supply, fill #0

## 2023-10-24 MED ORDER — PNEUMOCOCCAL 20-VAL CONJ VACC 0.5 ML IM SUSY
0.5000 mL | PREFILLED_SYRINGE | INTRAMUSCULAR | Status: DC
  Filled 2023-10-24: qty 0.5

## 2023-10-24 MED ORDER — SODIUM CHLORIDE 1 G PO TABS
3.0000 g | ORAL_TABLET | Freq: Three times a day (TID) | ORAL | 0 refills | Status: AC
  Filled 2023-10-24: qty 100, 11d supply, fill #0

## 2023-10-24 NOTE — Discharge Summary (Signed)
Physician Discharge Summary  Patient ID: Charles Dorsey MRN: [id number] DOB/AGE: [birth year] 62 y.o.  Admit date: 10/19/2023 Discharge date: 10/25/2023  Discharge Diagnoses:  Principal Problem:   TBI (traumatic brain injury) [facility]) Active Problems:   Essential hypertension   SDH (subdural hematoma) (HCC)   Closed fracture of temporal bone (HCC)   Type 2 diabetes mellitus (HCC)   Hyponatremia   Acute blood loss anemia   Frequent headaches   Discharged Condition: stable  Significant Diagnostic Studies: CT HEAD WO CONTRAST ( )  Result Date: 10/21/2023 CLINICAL DATA:  Traumatic brain injury (TBI), new or progressive neuro deficits EXAM: CT HEAD WITHOUT CONTRAST TECHNIQUE: Contiguous axial images were obtained from the base of the skull through the vertex without intravenous contrast. RADIATION DOSE REDUCTION: This exam was performed according to the departmental dose-optimization program which includes automated exposure control, adjustment of the mA and/or kV according to patient size and/or use of iterative reconstruction technique. COMPARISON:  CT Head 10/13/23 FINDINGS: Brain: There is an isodense right frontal convexity subdural hematoma measuring up to 5 mm. This previously measured 4 mm and was hyperdense. Interval decrease in size and conspicuity of the right frontal lobe hemorrhagic contusion. Interval decrease in the volume of subarachnoid hemorrhage along the right cerebral convexity. There is decreased leftward midline shift now measuring 3 mm, previously 7 mm. No evidence of intraventricular blood products. No hydrocephalus. Vascular: No hyperdense vessel or unexpected calcification. Skull: Redemonstrated left temporal bone fracture that extends into the parietal. Sinuses/Orbits: Small left mastoid and middle ear effusion paranasal sinuses are notable for frothy secretions in the right sphenoid sinus and mucosal thickening left sphenoid sinus. Orbits are unremarkable. Other: None.  IMPRESSION: 1. Interval decrease in size and conspicuity of the right frontal lobe hemorrhagic contusion. 2. Interval decrease in the volume of subarachnoid hemorrhage along the right cerebral convexity. 3. Interval decrease in leftward midline shift now measuring 3 mm, previously 7 mm. 4. Evolving right frontal convexity subdural hematoma measuring up to 5 mm. Electronically Signed   By: Lorenza Cambridge M.D.   On: 10/21/2023 16:43    Labs:  Basic Metabolic Panel: Recent Labs  Lab 10/20/23 0508 10/21/23 0522 10/22/23 0545 10/23/23 0534 10/24/23 0510 10/25/23 0558  NA 133* 129* 129* 132* 134* 131*  K 4.1 4.3 4.0 3.8 3.9 3.7  CL 99 99 100 106 105 103  CO2 25 21* 23 22 22  18*  GLUCOSE 119* 162* 144* 162* 122* 100*  BUN 17 19 27* 18 19 16   CREATININE 0.76 0.62 0.72 0.70 0.62 0.68  CALCIUM 9.1 9.2 8.8* 8.8* 9.2 8.9    CBC:    Latest Ref Rng & Units 10/25/2023    5:58 AM 10/22/2023    5:45 AM 10/20/2023    5:08 AM  CBC  WBC 4.0 - 10.5 K/uL 8.7  9.6  9.5   Hemoglobin 13.0 - 17.0 g/dL 82.9  56.2  13.0   Hematocrit 39.0 - 52.0 % 35.1  35.0  35.0   Platelets 150 - 400 K/uL 473  443  396      CBG: Recent Labs  Lab 10/24/23 0611 10/24/23 1149 10/24/23 1707 10/24/23 2158 10/25/23 0606  GLUCAP 114* 94 122* 154* 96    Brief HPI:   Charles Dorsey is a 62 y.o. male ***   Hospital Course: Charles Dorsey was admitted to rehab 10/19/2023 for inpatient therapies to consist of PT, ST and OT at least three hours five days a week. Past admission physiatrist,  therapy team and rehab RN have worked together to provide customized collaborative inpatient rehab.   Blood pressures were monitored on TID basis and   Diabetes has been monitored with ac/hs CBG checks and SSI was use prn for tighter BS control.  He will continue to receive outpatient PT and OT at [facility] after disharge.    Rehab course: During patient's stay in rehab weekly team conferences were held to monitor patient's  progress, set goals and discuss barriers to discharge. At admission, patient required  He  has had improvement in activity tolerance, balance, postural control as well as ability to compensate for deficits. He is able to complete ADL tasks with supervision. He is independent for transfers and requires supervision to ambulate 300' without AD. He requires supervision with min verbal cues to self correct and attend to structured tasks. Family education has been completed.    Disposition: Home  Diet: Diabetic diet. Limit fluids to 1200 cc  Special Instructions:  Discharge Instructions     Ambulatory referral to Occupational Therapy   Complete by: As directed    Eval and treat   Ambulatory referral to Physical Medicine Rehab   Complete by: As directed    Ambulatory referral to Physical Therapy   Complete by: As directed    Eval and treat   Ambulatory referral to Speech Therapy   Complete by: As directed    Eval and treat      Allergies as of 10/25/2023       Reactions   Penicillins    Patient states he used to be allergic to it. But unsure of reaction. Per spouse, he is not allergic.         Medication List     STOP taking these medications    aspirin EC 81 MG tablet   atorvastatin 40 MG tablet Commonly known as: Lipitor   losartan 25 MG tablet Commonly known as: COZAAR       TAKE these medications    Acetaminophen Extra Strength 500 MG Tabs Take 2 tablets (1,000 mg total) by mouth every 8 (eight) hours. What changed:  how much to take when to take this reasons to take this   amLODipine 5 MG tablet Commonly known as: NORVASC Take 1 tablet (5 mg total) by mouth daily.   CertaVite/Antioxidants Tabs Take 1 tablet by mouth daily.   ciprofloxacin-dexamethasone OTIC suspension Commonly known as: CIPRODEX Place 4 drops into the left ear 2 (two) times daily.   diclofenac Sodium 1 % Gel Commonly known as: VOLTAREN Apply 2 g topically 4 (four) times daily. To  neck and shoulders   folic acid 1 MG tablet Commonly known as: FOLVITE Take 1 tablet (1 mg total) by mouth daily.   levETIRAcetam 500 MG tablet Commonly known as: KEPPRA Take 1 tablet (500 mg total) by mouth 2 (two) times daily.   melatonin 5 MG Tabs Take 1 tablet (5 mg total) by mouth at bedtime as needed.   metFORMIN 1000 MG tablet Commonly known as: GLUCOPHAGE Take 1 tablet (1,000 mg total) by mouth 2 (two) times daily with a meal.   metoprolol succinate 50 MG 24 hr tablet Commonly known as: TOPROL-XL Take 1 tablet (50 mg total) by mouth daily. Take with or immediately following a meal.   naphazoline-glycerin 0.012-0.25 % Soln Commonly known as: CLEAR EYES REDNESS Place 2 drops into both eyes 4 (four) times daily.   nitroGLYCERIN 0.4 MG SL tablet Commonly known as: NITROSTAT PLACE 1  TABLET UNDER TONGUE EVERY 5 MINUTES AS NEEDED CHEST PAIN   pantoprazole 20 MG tablet Commonly known as: PROTONIX Take 1 tablet (20 mg total) by mouth daily.   polyethylene glycol powder 17 GM/SCOOP powder Commonly known as: GLYCOLAX/MIRALAX Take 1 capful (17 g) by mouth 2 (two) times daily. Notes to patient: Laxative   Senna-S 8.6-50 MG tablet Generic drug: senna-docusate Take 1 tablet by mouth 2 (two) times daily. Notes to patient: Laxative    sodium chloride 1 g tablet Take 3 tablets (3 g total) by mouth 3 (three) times daily with meals. Notes to patient: Will need labs on Tuesday at [place]. They will tell you how long to keep taking this medication   topiramate 50 MG tablet Commonly known as: TOPAMAX Take 1 tablet (50 mg total) by mouth 2 (two) times daily.   traMADol 50 MG tablet Commonly known as: ULTRAM Take 1 tablet (50 mg total) by mouth every 6 (six) hours. Notes to patient: For pain--you have been getting it four times a day--wean to three times a day for a  couple of days then to wean to twice a day then as needed        Follow-up Information     [facility] Call on 10/30/2023.   Why: Be there 4:15 for 4:30 pm appointment. Contact information: [address] [place] [address] [phone]         Christia Reading, MD Follow up on 10/29/2023.   Specialty: Otolaryngology Why: Appointment on 4:15/be there at 4 pm. Bring your insurance card, ID and copay. Contact information: [address] [place] [address] [phone]         Elijah Birk C, DO Follow up.   Specialty: Physical Medicine and Rehabilitation Why: office will call you with follow up appointment Contact information: [address] [place] [address] [phone]         Coletta Memos, MD. Call.   Specialty: Neurosurgery Why: for follow up appointment Contact information: [address] [place] [address] [phone]                 Signed: Jacquelynn Cree 10/26/2023, 4:08 PM

## 2023-10-24 NOTE — Progress Notes (Signed)
Speech Language Pathology Discharge Summary  Patient Details  Name: Charles Dorsey MRN: [id number] Date of Birth: [birth year]  Date of Discharge from SLP service:October 24, 2023  Today's Date: 10/24/2023 SLP Individual Time: 5284-1324 SLP Individual Time Calculation (min): 55 min   Skilled Therapeutic Interventions:  Pt seen for skilled SLP intervention to address cognitive-linguistic goals. Live interpreter present.  Today's session focused on safety awareness at home and with visual safety picture.  Pt required min-mod verbal cues to identify various safety hazards in a pictured scene. Cueing appeared to be needed due to reduced attention, as pt would often go on a tangent and require redirection to task. Pt able to reason through various hypothetical safety situations with less cueing. Pt left sitting upright in bed with bed alarm activated and call bell in reach. Discharge summary completed below.     Patient has met 4 of 4 long term goals.  Patient to discharge at overall Supervision level.  Reasons goals not met:   n/a  Clinical Impression/Discharge Summary: Patient has demonstrated functional gains toward communication and cognitive goals meeting 4 of 4 long-term goals this admission. Pt is currently completing cognitive tasks at the Supervision level with min verbal cues to self correct and re-direct attention to structured tasks. Patient and family education is complete and patient to discharge at overall supervision level. Patient's care partner is independent to provide the necessary physical and cognitive-communication assistance at discharge. Patient would benefit from continued SLP services in outpatient setting to maximize communication skills and functional independence and reduce caregiver burden.    Care Partner:  Caregiver Able to Provide Assistance: Yes  Type of Caregiver Assistance: Physical;Cognitive  Recommendation:  Outpatient SLP;24 hour supervision/assistance   Rationale for SLP Follow Up: Maximize cognitive function and independence;Reduce caregiver burden   Equipment:   none per SLP  Reasons for discharge: Discharged from hospital;Treatment goals met   Patient/Family Agrees with Progress Made and Goals Achieved: Yes    [name] 10/24/2023, 10:04 AM

## 2023-10-24 NOTE — Plan of Care (Signed)
  Problem: RH Problem Solving Goal: LTG Patient will demonstrate problem solving for (SLP) Description: LTG:  Patient will demonstrate problem solving for basic/complex daily situations with cues  (SLP) Outcome: Completed/Met Flowsheets (Taken 10/20/2023 1522 by Ann Lions, CCC-SLP) LTG: Patient will demonstrate problem solving for (SLP): Basic daily situations   Problem: RH Memory Goal: LTG Patient will use memory compensatory aids to (SLP) Description: LTG:  Patient will use memory compensatory aids to recall biographical/new, daily complex information with cues (SLP) Outcome: Completed/Met Flowsheets (Taken 10/20/2023 1522 by Ann Lions, CCC-SLP) LTG: Patient will use memory compensatory aids to (SLP): Minimal Assistance - Patient > 75%   Problem: RH Attention Goal: LTG Patient will demonstrate this level of attention during functional activites (SLP) Description: LTG:  Patient will will demonstrate this level of attention during functional activites (SLP) Outcome: Completed/Met Flowsheets (Taken 10/20/2023 1522 by Ann Lions, CCC-SLP) Patient will demonstrate during cognitive/linguistic activities the attention type of: Selective Patient will demonstrate this level of attention during cognitive/linguistic activities in: Controlled LTG: Patient will demonstrate this level of attention during cognitive/linguistic activities with assistance of (SLP): Minimal Assistance - Patient > 75%   Problem: RH Awareness Goal: LTG: Patient will demonstrate awareness during functional activites type of (SLP) Description: LTG: Patient will demonstrate awareness during functional activites type of (SLP) Flowsheets (Taken 10/20/2023 1522 by Ann Lions, CCC-SLP) Patient will demonstrate during cognitive/linguistic activities awareness type of: Emergent LTG: Patient will demonstrate awareness during cognitive/linguistic activities with assistance of (SLP): Independent

## 2023-10-24 NOTE — Progress Notes (Signed)
Inpatient Rehabilitation Discharge Medication Review by a Pharmacist  A complete drug regimen review was completed for this patient to identify any potential clinically significant medication issues.  High Risk Drug Classes Is patient taking? Indication by Medication  Antipsychotic No   Anticoagulant No   Antibiotic Yes Ciprodex - ear infection  Opioid Yes Tramadol - pain  Antiplatelet No   Hypoglycemics/insulin Yes Metformin - DM  Vasoactive Medication Yes Toprol/amlodipine - HTN   Chemotherapy No   Other Yes Keppra- TBI, sz ppx  NTG PRN- chest pain/ACS  sodium chloride tabs- hyponatremia  Pantoprazole- reflux Melatonin - sleep  Voltaren gel - pain Folic acid - anemia Topamax - headache     Type of Medication Issue Identified Description of Issue Recommendation(s)  Drug Interaction(s) (clinically significant)     Duplicate Therapy     Allergy     No Medication Administration End Date     Incorrect Dose     Additional Drug Therapy Needed     Significant med changes from prior encounter (inform family/care partners about these prior to discharge).    Other       Clinically significant medication issues were identified that warrant physician communication and completion of prescribed/recommended actions by midnight of the next day:  No  Name of provider notified for urgent issues identified:   Provider Method of Notification:     Pharmacist comments:   Time spent performing this drug regimen review (minutes):  20   Ulyses Southward, PharmD, [place], AAHIVP, CPP Infectious Disease Pharmacist 10/24/2023 11:16 AM

## 2023-10-24 NOTE — Discharge Instructions (Addendum)
Inpatient Rehab Discharge Instructions  Charles Dorsey Discharge date and time:  10/25/23  Activities/Precautions/ Functional Status: Activity: no lifting, driving, or strenuous exercise till cleared by MD Diet: diabetic diet Limit fluids to 5 cups a day (1200 cc/day) Wound Care: none needed   Functional status:  ___ No restrictions     ___ Walk up steps independently _X__ 24/7 supervision/assistance   ___ Walk up steps with assistance ___ Intermittent supervision/assistance  ___ Bathe/dress independently ___ Walk with walker     ___ Bathe/dress with assistance ___ Walk Independently    ___ Shower independently ___ Walk with assistance    _X__ Shower with assistance _X__ No alcohol     ___ Return to work/school ________  Special Instructions: Outpatient PT, OT, and ST at [facility] on [address]. [phone]) They will call with appointment.    My questions have been answered and I understand these instructions. I will adhere to these goals and the provided educational materials after my discharge from the hospital.  Patient/Caregiver Signature _______________________________ Date __________  Clinician Signature _______________________________________ Date __________  Please bring this form and your medication list with you to all your follow-up doctor's appointments.

## 2023-10-24 NOTE — Plan of Care (Signed)
  Problem: RH Balance Goal: LTG: Patient will maintain dynamic sitting balance (OT) Description: LTG:  Patient will maintain dynamic sitting balance with assistance during activities of daily living (OT) Outcome: Completed/Met Goal: LTG Patient will maintain dynamic standing with ADLs (OT) Description: LTG:  Patient will maintain dynamic standing balance with assist during activities of daily living (OT)  Outcome: Completed/Met   Problem: Sit to Stand Goal: LTG:  Patient will perform sit to stand in prep for activites of daily living with assistance level (OT) Description: LTG:  Patient will perform sit to stand in prep for activites of daily living with assistance level (OT) Outcome: Completed/Met   Problem: RH Bathing Goal: LTG Patient will bathe all body parts with assist levels (OT) Description: LTG: Patient will bathe all body parts with assist levels (OT) Outcome: Completed/Met   Problem: RH Dressing Goal: LTG Patient will perform upper body dressing (OT) Description: LTG Patient will perform upper body dressing with assist, with/without cues (OT). Outcome: Completed/Met Goal: LTG Patient will perform lower body dressing w/assist (OT) Description: LTG: Patient will perform lower body dressing with assist, with/without cues in positioning using equipment (OT) Outcome: Completed/Met   Problem: RH Toileting Goal: LTG Patient will perform toileting task (3/3 steps) with assistance level (OT) Description: LTG: Patient will perform toileting task (3/3 steps) with assistance level (OT)  Outcome: Completed/Met   Problem: RH Toilet Transfers Goal: LTG Patient will perform toilet transfers w/assist (OT) Description: LTG: Patient will perform toilet transfers with assist, with/without cues using equipment (OT) Outcome: Completed/Met   Problem: RH Tub/Shower Transfers Goal: LTG Patient will perform tub/shower transfers w/assist (OT) Description: LTG: Patient will perform tub/shower  transfers with assist, with/without cues using equipment (OT) Outcome: Completed/Met   Problem: RH Attention Goal: LTG Patient will demonstrate this level of attention during functional activites (OT) Description: LTG:  Patient will demonstrate this level of attention during functional activites  (OT) Outcome: Completed/Met

## 2023-10-24 NOTE — Progress Notes (Signed)
Occupational Therapy Discharge Summary  Patient Details  Name: STONY LIPPER MRN: [id number] Date of Birth: 15-Jul-1961  Date of Discharge from OT service:October 24, 2023  Today's Date: 10/24/2023 OT Individual Time: 6433-2951 OT Individual Time Calculation (min): 45 min    Patient has met 10 of 10 long term goals due to improved activity tolerance, improved balance, postural control, ability to compensate for deficits, functional use of  RIGHT upper, RIGHT lower, LEFT upper, and LEFT lower extremity, improved attention, improved awareness, and improved coordination.  Patient to discharge at overall Supervision level.  Patient's care partner unavailable in person but pt able to direct care to provide the necessary physical and cognitive assistance at discharge.    Reasons goals not met: n/a   Recommendation:  Patient will benefit from ongoing skilled OT services in outpatient setting to continue to advance functional skills in the area of iADL, Vocation, and Reduce care partner burden.  Equipment: Shower chair   Reasons for discharge: treatment goals met  Patient/family agrees with progress made and goals achieved: Yes  OT Discharge Precautions/Restrictions  Precautions Precautions: Fall Restrictions Weight Bearing Restrictions: No  Vital Signs  110/80, HR 76 bpm, SpO2 100%   Pain Pain Assessment Pain Scale: 0-10 Pain Score: 0-No pain Pain Type: Acute pain Pain Location: Head Pain Descriptors / Indicators: Aching Pain Onset: On-going Patients Stated Pain Goal: 2 Pain Intervention(s): Pain med given for lower pain score than stated, per patient request;Repositioned;Shower;Distraction;Emotional support;Relaxation Multiple Pain Sites: No ADL ADL Equipment Provided: Long-handled sponge Eating: Independent Where Assessed-Eating: Edge of bed Grooming: Modified independent Where Assessed-Grooming: Sitting at sink, Edge of bed Upper Body Bathing: Supervision/safety Where  Assessed-Upper Body Bathing: Shower Lower Body Bathing: Supervision/safety Where Assessed-Lower Body Bathing: Shower Upper Body Dressing: Supervision/safety Where Assessed-Upper Body Dressing: Sitting at sink Lower Body Dressing: Supervision/safety Where Assessed-Lower Body Dressing: Sitting at sink, Standing at sink Toileting: Supervision/safety Where Assessed-Toileting: Teacher, adult education: Close supervision Toilet Transfer Method: Insurance claims handler: Close supervison Web designer Method: Ship broker: Information systems manager with back Film/video editor: Close supervision Film/video editor Method: Designer, industrial/product: Information systems manager with back ADL Comments: S for all mobility and sstanding level ADL's without AD due to balance and orthostatic risk Vision Baseline Vision/History: 1 Wears glasses Perception  Perception: Within Functional Limits Praxis Praxis: WFL Cognition Cognition Overall Cognitive Status: Impaired/Different from baseline Arousal/Alertness: Awake/alert Orientation Level: Person;Situation;Place Memory: Impaired Memory Impairment: Retrieval deficit Attention: Focused;Sustained;Selective Focused Attention: Appears intact Sustained Attention: Impaired Sustained Attention Impairment: Verbal complex Selective Attention: Impaired Awareness: Impaired Awareness Impairment: Anticipatory impairment Problem Solving: Impaired Problem Solving Impairment: Functional basic Executive Function: Landscape architect: Impaired Behaviors: Impulsive Safety/Judgment: Impaired Comments: mild impulsivity but easily redirected [employer] Scales of Cognitive Functioning: Purposeful, Appropriate: Stand-by Assistance Brief Interview for Mental Status (BIMS) Repetition of Three Words (First Attempt): 3 Temporal Orientation: Year: Correct Temporal Orientation: Month: Accurate within 5 days Temporal Orientation: Day:  Correct Recall: "Sock": Yes, no cue required Recall: "Blue": Yes, no cue required Recall: "Bed": Yes, no cue required BIMS Summary Score: 15 Sensation Sensation Light Touch: Appears Intact Hot/Cold: Appears Intact Proprioception: Appears Intact Stereognosis: Appears Intact Coordination Gross Motor Movements are Fluid and Coordinated: No Fine Motor Movements are Fluid and Coordinated: Yes Coordination and Movement Description: higher level balance deficits Finger Nose Finger Test: [facility] 9 Hole Peg Test: R 22 sec, L 24 sec Motor  Motor Motor: Other (comment) Motor - Discharge Observations: continues to present with higher level balance  deficits especially when HA worse requiring S Mobility  Bed Mobility Bed Mobility: Sit to Supine;Supine to Sit Supine to Sit: Independent Sit to Supine: Independent Transfers Sit to Stand: Supervision/Verbal cueing Stand to Sit: Supervision/Verbal cueing  Trunk/Postural Assessment  Cervical Assessment Cervical Assessment: Within Functional Limits Thoracic Assessment Thoracic Assessment: Within Functional Limits Lumbar Assessment Lumbar Assessment: Within Functional Limits Postural Control Postural Control: Deficits on evaluation Righting Reactions: delayed  Balance Balance Balance Assessed: Yes Standardized Balance Assessment Standardized Balance Assessment: (P) Berg Balance Test Berg Balance Test Sit to Stand: (P) Able to stand without using hands and stabilize independently Sitting with Back Unsupported but Feet Supported on Floor or Stool: (P) Able to sit safely and securely 2 minutes Stand to Sit: (P) Controls descent by using hands Transfers: (P) Able to transfer safely, minor use of hands Standing Unsupported with Eyes Closed: (P) Able to stand 10 seconds with supervision Standing Ubsupported with Feet Together: (P) Able to place feet together independently and stand for 1 minute with supervision From Standing, Reach Forward with  Outstretched Arm: (P) Can reach forward >12 cm safely (5") From Standing Position, Pick up Object from Floor: (P) Able to pick up shoe, needs supervision From Standing Position, Turn to Look Behind Over each Shoulder: (P) Turn sideways only but maintains balance Turn 360 Degrees: (P) Able to turn 360 degrees safely but slowly Standing Unsupported, Alternately Place Feet on Step/Stool: (P) Able to stand independently and complete 8 steps >20 seconds Standing Unsupported, One Foot in Front: (P) Able to plae foot ahead of the other independently and hold 30 seconds Standing on One Leg: (P) Able to lift leg independently and hold equal to or more than 3 seconds Static Standing Balance Static Standing - Balance Support: During functional activity Static Standing - Level of Assistance: 5: Stand by assistance Dynamic Standing Balance Dynamic Standing - Balance Support: During functional activity Dynamic Standing - Level of Assistance: 5: Stand by assistance Dynamic Standing - Balance Activities: Reaching for objects Extremity/Trunk Assessment RUE Assessment RUE Assessment: Within Functional Limits LUE Assessment LUE Assessment: Within Functional Limits  OT Intervention/Treatment:  Pt seen for final skilled OT session with discharge reassessment and education completed. [place] Interpreter present for session. Pt with improved HA this am reporting 4-5/10 throughout session. Pt refusing TED hose this am despite encouragement but VSS. See above for levels for full am ADL shower routine and standing tolerance/balance sink side. Pt's shower chair was delivered for d/c and pt aware and able to teach back of OT rec for 25 hr S for family at home, use of gait belt at all times with mobility, B UE HEP provided last session, use of shower chair and LH sponge, and follow up OT. Hand off to ST for their session. All goals met and no further OT needs at [facility].    Vicenta Dunning 10/24/2023, 11:27 AM

## 2023-10-24 NOTE — Progress Notes (Signed)
Inpatient Rehabilitation Care Coordinator Discharge Note   Patient Details  Name: Charles Dorsey MRN: [id number] Date of Birth: 1961/08/05   Discharge location: D/c to home with support from wife and dtr  Length of Stay: 5 days  Discharge activity level: Supervision  Home/community participation: Limited  Patient response VV:OHYWVP Literacy - How often do you need to have someone help you when you read instructions, pamphlets, or other written material from your doctor or pharmacy?: Rarely  Patient response XT:GGYIRS Isolation - How often do you feel lonely or isolated from those around you?: Never  Services provided included: MD, RD, PT, OT, RN, CM, Pharmacy, Neuropsych, SW, TR, SLP  Financial Services:  Field seismologist Utilized: Barrister's clerk  Choices offered to/list presented to: patient and pt dtr  Follow-up services arranged:  DME, Outpatient    Outpatient Servicies: [facility] for Outpatient PT/OT/SLP DME : [employer] for shower chair    Patient response to transportation need: Is the patient able to respond to transportation needs?: Yes In the past 12 months, has lack of transportation kept you from medical appointments or from getting medications?: No In the past 12 months, has lack of transportation kept you from meetings, work, or from getting things needed for daily living?: No   Patient/Family verbalized understanding of follow-up arrangements:  Yes  Individual responsible for coordination of the follow-up plan: contact [name] [phone]  Confirmed correct DME delivered: Gretchen Short 10/24/2023    Comments (or additional information):  Summary of Stay    Date/Time Discharge Planning CSW  10/23/23 0934 Pt will d/c tohome with his wife and PRN support from dtr. Pt wife works but understands she will provide primary support. SW will confirm there are no barriers to discharge. AAC       [name]

## 2023-10-24 NOTE — Progress Notes (Signed)
Physical Therapy Discharge Summary  Patient Details  Name: Charles Dorsey MRN: [id number] Date of Birth: Sep 19, 1961  Date of Discharge from PT service:October 24, 2023  Today's Date: 10/24/2023 PT Individual Time: 1031-1059 PT Individual Time Calculation (min): 28 min   Today's Date: 10/24/2023 PT Individual Time: 2952-8413 PT Individual Time Calculation (min): 69 min    Patient has met 9 of 9 long term goals due to improved activity tolerance, improved balance, improved postural control, improved attention, and improved awareness.  Patient to discharge at an ambulatory level Supervision.   Patient's care partner is independent to provide the necessary physical and cognitive assistance at discharge.  Reasons goals not met: NA  Recommendation:  Patient will benefit from ongoing skilled PT services in outpatient setting to continue to advance safe functional mobility, address ongoing impairments in high level balance, ambulation, endurance, and minimize fall risk.  Equipment: No equipment provided  Reasons for discharge: treatment goals met and discharge from hospital  Patient/family agrees with progress made and goals achieved: Yes  Skilled Therapeutic Interventions:  1st Session: Pt received supine in bed and agrees to therapy. Reports pain in head, 7/10. PT provides rest breaks as needed to manage pain. Pt perform bed mobility independently. Pt stands independently and ambulates x100' to gym. Following rest break, pt completes [employer] test, as detailed below. Pt then ambulates back to room and transfers onto toilet with cues for positioning. Pt ambulates from toilet to bed with cues for safety and navigating crowded environment. Pt left supine with alarm intact and all needs within reach.   2nd Session: Pt received supine in bed and agrees to therapy. Reports pain in head, 7/10. PT provides rest breaks as needed to manage pain. Pt perform bed mobility independently. Pt stands  independently and ambulates x300' to gym without AD, with cues for upright gaze to improve posture and balance and increasing trunk rotation and arm swing to decrease risk for falls. Following rest break, pt completes ramp navigation and car transfer with cues for sequencing. Pt ambulates to main gym and completes x12 6" steps with bilateral handrails and cues for safe step sequencing. Pt then ambulates to dayroom and completes Nustep for endurance training and reciprocal coordination training. Pt completes x15:00 at workload of 7 with average step per minute ~40. PT provides cues for hand and foot placement and completing full available ROM.  Pt completes dynamic, high level gait challenge, tasked with ambulating over level ground and stpping over 6 inch hurdles and up and over 4 inch step. Pt requires CGA and cueing for safe sequencing of gait challenge, consistently contacting hurdles initially during task, but improving performance with repeated trials. Pt takes several seated rest breaks during activity. Pt ambulates back to room. Left supine with alarm intact and all needs within reach.    PT Discharge Precautions/Restrictions   Pain Interference Pain Interference Pain Effect on Sleep: 3. Frequently Pain Interference with Therapy Activities: 3. Frequently Pain Interference with Day-to-Day Activities: 3. Frequently Vision/Perception  Vision - History Ability to See in Adequate Light: 0 Adequate Perception Perception: Within Functional Limits Praxis Praxis: WFL  Cognition Overall Cognitive Status: Impaired/Different from baseline Arousal/Alertness: Awake/alert Orientation Level: Oriented X4 Year: 2024 Month: November Day of Week: Correct Attention: Focused;Sustained;Selective Focused Attention: Appears intact Sustained Attention: Impaired Sustained Attention Impairment: Verbal complex Selective Attention: Impaired Awareness Impairment: Anticipatory impairment Safety/Judgment:  Impaired [employer] Scales of Cognitive Functioning: Purposeful, Appropriate: Stand-by Assistance Sensation Sensation Light Touch: Appears Intact Coordination Gross  Motor Movements are Fluid and Coordinated: No Fine Motor Movements are Fluid and Coordinated: Yes Coordination and Movement Description: higher level balance deficits Finger Nose Finger Test: [facility] Motor  Motor Motor: Within Functional Limits Motor - Discharge Observations: continues to present with higher level balance deficits especially when HA worse requiring S  Mobility Bed Mobility Bed Mobility: Sit to Supine;Supine to Sit Supine to Sit: Independent Sit to Supine: Independent Transfers Transfers: Sit to Stand;Stand to Sit;Stand Pivot Transfers Sit to Stand: Independent Stand to Sit: Independent Stand Pivot Transfers: Independent Transfer (Assistive device): None Locomotion  Gait Ambulation: Yes Gait Assistance: Supervision/Verbal cueing Gait Distance (Feet): 300 Feet Assistive device: None Gait Assistance Details: Verbal cues for technique Gait Gait Pattern: Impaired Gait Pattern: Decreased trunk rotation;Decreased stride length Stairs / Additional Locomotion Stairs: Yes Stairs Assistance: Supervision/Verbal cueing Stair Management Technique: Two rails Number of Stairs: 12 Height of Stairs: 6 Ramp: Supervision/Verbal cueing Curb: Supervision/Verbal cueing Wheelchair Mobility Wheelchair Mobility: No  Trunk/Postural Assessment  Cervical Assessment Cervical Assessment: Within Functional Limits Thoracic Assessment Thoracic Assessment: Within Functional Limits Lumbar Assessment Lumbar Assessment: Within Functional Limits Postural Control Postural Control: Deficits on evaluation Righting Reactions: delayed  Balance Balance Balance Assessed: Yes Static Standing Balance Static Standing - Balance Support: During functional activity Static Standing - Level of Assistance: 5: Stand by  assistance Dynamic Standing Balance Dynamic Standing - Balance Support: During functional activity Dynamic Standing - Level of Assistance: 5: Stand by assistance Dynamic Standing - Balance Activities: Reaching for objects Extremity Assessment  RLE Assessment RLE Assessment: Within Functional Limits LLE Assessment LLE Assessment: Within Functional Limits   Beau Fanny, PT, DPT 10/24/2023, 4:17 PM

## 2023-10-24 NOTE — Progress Notes (Signed)
PROGRESS NOTE   Subjective/Complaints:  Ongoing unremitting headache -however he does say that it is less intense this a.m. No PRN use, Refusing multiple medications BG elevated, Na 134 today Intakes poor 240 ccs daily, however again mostly eating outside food so inaccurate. LBM liquid 11/26; multiple liquid Bms recently   ROS: Ongoing headache -improved.  Patient denies fever, rash, sore throat, blurred vision,  vomiting, diarrhea, cough, shortness of breath or chest pain, joint or back/neck pain,  or mood change.   Objective:   No results found. Recent Labs    10/22/23 0545  WBC 9.6  HGB 12.2*  HCT 35.0*  PLT 443*   Recent Labs    10/23/23 0534 10/24/23 0510  NA 132* 134*  K 3.8 3.9  CL 106 105  CO2 22 22  GLUCOSE 162* 122*  BUN 18 19  CREATININE 0.70 0.62  CALCIUM 8.8* 9.2    Intake/Output Summary (Last 24 hours) at 10/24/2023 0837 Last data filed at 10/24/2023 0729 Gross per 24 hour  Intake 480 ml  Output --  Net 480 ml        Physical Exam: Vital Signs Blood pressure 112/70, pulse 66, temperature 98.9 F (37.2 C), temperature source Oral, resp. rate 16, height 5\' 8"  (1.727 m), weight 62.3 kg, SpO2 100%. Constitutional: No distress . Vital signs reviewed.  Lying in bed. HEENT: NCAT, EOMI, oral membranes moist. No nystagmus on EOMI.  Cardiovascular: RRR without murmur. No JVD    Respiratory/Chest: CTA Bilaterally without wheezes or rales. Normal effort    GI/Abdomen: BS +, non-tender, non-distended Ext: no clubbing, cyanosis, or edema Psych: pleasant and cooperative .  Perseverative on headaches. Skin: C/D/I. No apparent lesions. MSK:      No apparent deformity. Normal neck rom. No TTP throughout neck and shoulders.        Neurologic exam:  Cognition: AAO to person, place, time and event.   Insight: Good  insight into current condition.  Sensation: To light touch reduced in distal RUE and  RLE  CN: No further appreciable weakness in left face.  Intact. Strength: Antigravity and against resistance in all 4 extremities, 5/5 throughout  Assessment/Plan: 1. Functional deficits which require 3+ hours per day of interdisciplinary therapy in a comprehensive inpatient rehab setting. Physiatrist is providing close team supervision and 24 hour management of active medical problems listed below. Physiatrist and rehab team continue to assess barriers to discharge/monitor patient progress toward functional and medical goals  Care Tool:  Bathing    Body parts bathed by patient: Right arm, Left arm, Chest, Abdomen, Front perineal area, Buttocks, Right upper leg, Left upper leg, Face, Right lower leg, Left lower leg         Bathing assist Assist Level: Supervision/Verbal cueing     Upper Body Dressing/Undressing Upper body dressing   What is the patient wearing?: Pull over shirt    Upper body assist Assist Level: Supervision/Verbal cueing    Lower Body Dressing/Undressing Lower body dressing      What is the patient wearing?: Pants, Underwear/pull up     Lower body assist Assist for lower body dressing: Supervision/Verbal cueing     Toileting Toileting  Toileting assist Assist for toileting: Moderate Assistance - Patient 50 - 74%     Transfers Chair/bed transfer  Transfers assist     Chair/bed transfer assist level: Contact Guard/Touching assist     Locomotion Ambulation   Ambulation assist      Assist level: Contact Guard/Touching assist Assistive device: No Device Max distance: 150'   Walk 10 feet activity   Assist     Assist level: Supervision/Verbal cueing Assistive device: No Device   Walk 50 feet activity   Assist    Assist level: Contact Guard/Touching assist Assistive device: No Device    Walk 150 feet activity   Assist Walk 150 feet activity did not occur: Safety/medical concerns  Assist level: Contact Guard/Touching  assist Assistive device: No Device    Walk 10 feet on uneven surface  activity   Assist Walk 10 feet on uneven surfaces activity did not occur: Safety/medical concerns         Wheelchair     Assist Is the patient using a wheelchair?: Yes Type of Wheelchair: Manual    Wheelchair assist level: Dependent - Patient 0%      Wheelchair 50 feet with 2 turns activity    Assist        Assist Level: Dependent - Patient 0%   Wheelchair 150 feet activity     Assist      Assist Level: Dependent - Patient 0%   Blood pressure 112/70, pulse 66, temperature 98.9 F (37.2 C), temperature source Oral, resp. rate 16, height 5\' 8"  (1.727 m), weight 62.3 kg, SpO2 100%.  1. 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 Limited by headache, but easily distracted from it.  PT already supervision overall. SLP sustained attention an issue, Mod A currently.   The patient is medically ready for discharge to home on 11/28 and will need follow-up with [facility]. In addition, they will need to follow up with their PCP, Neurosurgery and ENT.   2.  Antithrombotics: -DVT/anticoagulation:  Pharmaceutical: Lovenox             -antiplatelet therapy: Off ASA due to TBI/bleed 3. Persistent headaches/Pain Management: Continue tylenol qid. Fioricet/oxycodone ineffective --start gabapentin 300 mg bid with 600 mg at nights for sleep and pain per discussion w/Dr. Shearon Stalls 11-23: Add Topamax 25 mg twice daily. 11-24: Increase gabapentin to 600 mg 3 times daily.  In afternoon, severe worsening of headache limiting therapies, repeat CT head stable/improved.  Increase Topamax to 50 mg twice daily for  tomorrow. 11/25 gabapentin and topamax being dosed aggressively. Topamax increases today  -will try tramadol prn for headaches, has tried fioricet and oxy 11.26: Gabapentin to 300 mg TID PRN and tramadol scheduled Q6H  11-27: Headache slightly improved with regimen, not using gabapentin as needed, can discontinue at discharge.  4. Mood/Behavior/Sleep: LCSW to follow for evaluation and support.              -antipsychotic agents: N/A             --gabapentin  600 mg/hs for sleep and pain. Trazodone prn also on board.  5. Neuropsych/cognition: This patient may be intermittently capable of making decisions on his own behalf. 6. Skin/Wound Care: Routine pressure relief measures.  7. Fluids/Electrolytes/Nutrition: Daily BMET to monitor sodium. Continue 1200 cc FR             --change ensure to Ensure max to prevent surgar spikes. Will add prostat also  11-23: Wife bring in food from home, patient minimally eating hospital food, may complicate picture.  Will provide education -good p.o.'s recorded.  11/25 po intake variable. Continue to encourage, food from home helps  8. FUO: Resolving. Attempt/encourage pulmonary hygiene with flutter valve qid             --leucocytosis on last CBC 11/15-->monitor for signs of infection and for rise in white count --recheck CBC in am.   -11-23: No recurrent fever, leukocytosis stable/resolved, monitor  10. Hyponatremia: Cerebral salt wasting?Marland Kitchen On Ure-Na since 11/20 and salt tabs 3 mg TID             --monitor with daily checks as continues to fluctuate--> 132-->134-->134-->132-->128--132             --po intake seems to be variable. Needs to be fed per chart review   11-23: NA 133, continue current regimen through Monday and if no worsening hyponatremia can start to wean salt tabs  11/24: Na 129 today; change Cozaar 15 mg daily to Norvasc 5 mg daily starting 11/25. Already on 0.5 mg/kg dosing for Ure-Na and 3 G TID salt tabs with fluid restriction.  Patient has  been refusing urine powder due to taste; encouraged him to take and advised if continued downtrend may need more aggressive measures.   11/25 sodium holding at 129--no changes today  11-26: Sodium 132; improving.  Continue current regimen until normalized  11-20; sodium 134.  Can trial off of Urena at discharge with follow-up labs next week.  11. Pre-renal azotemia: Likely due to FR and dose of lasix before? -11/25 recurrent--push adequate fluids up to 1200cc. ?relax FR?   -recheck bmet tomorrow 11-26: BUN and creatinine normal  12. Dizziness: Order vestibular evaluation. Check orthostatic vitals to monitor for drop with activity             --may need scopolamine patch is symptoms limiting and BP stable  11-23: Was extremely orthostatic on exam with PT and OT today, add TED hose and abdominal binder, may need addition of midodrine due to fluid restriction from hyponatremia  11-24: Reportedly much improved with therapies with binders and TED hose, with blood pressure into the 160s; maintained in the 140s with TED hose only.  11/26: Reduce gabapentin to 300 mg TID PRN due to dizziness, lethargy. Orthostats still good with TEDs.  11-27: Symptoms improved  13. HTN: Monitor BP TID--continue Cozaar and metoprolol. Will set hold parameters.    10/23/2023    7:52 PM 10/23/2023   12:56 PM 10/23/2023    4:52 AM  Vitals with BMI  Systolic 112 122 347  Diastolic 70 70 65  Pulse 66 72 69   11-25 bp's controlled  14. T2DM: Monitor BS ac/hs. Hgb A1C- 8.4.  Po intake variable with fluctuating BS.              --continue Insulin glargline 10 units daily with SSI for elevated BS/tighter control.              --transition Insulin to metformin as intake improves. Ensure  changed to ensure Max as lower carbs.   11-23: Difficult to track carbs due to wife bringing in food.  Will monitor trends today and adjust long-acting as necessary  11/24:BMP BUN/Cr looks good; intakes stable; add Metformin 500 mg BID  and reduce semglee to 5 U for 11/25  11/25 cbg's fair--adjusting regimen as above--observe today  11-26: BUN/creatinine with good control.  DC Semglee.  Increase metformin to 1000 mg twice daily.  11-27: Some mild highs, but overall well-controlled. Recent Labs    10/23/23 1751 10/23/23 2119 10/24/23 0611  GLUCAP 210* 170* 114*     15. CAD s/p PTCS/stent LAD: followed by Dr. Sherri Sear. Treated with metoprolol, Cozaar, ASA, lipitor             --monitor for symptoms with activity.  16. Abdominal pain: Likely due to GERD--had BM today --PPI resumed.    Senokot   1 tab twice daily due to constipation, last bowel movement 11/23  -also on miralax bid Last bowel movement medium, type II, 11-25 11-27: Had diarrhea stool overnight.  Patient endorses some food intolerances.  Added as needed Imodium.  17. Left TMJ fracture: No difficulty or pain with chewing. No facial pain or increase in weakness.  --Left facial weakness was noted by NS at admission. Monitor for now.   - 11/26: Endorsing hearing deficits post-trauma, not appreciated on exam.  OP f/u ENT Dr. Elijah Birk, resume ciprodex BID  18. Seizure prophylaxis: Continue Keppra bid.  No seizures since admission.    LOS: 5 days A FACE TO FACE EVALUATION WAS PERFORMED  Angelina Sheriff 10/24/2023, 8:37 AM

## 2023-10-25 DIAGNOSIS — E871 Hypo-osmolality and hyponatremia: Secondary | ICD-10-CM | POA: Diagnosis not present

## 2023-10-25 DIAGNOSIS — R7989 Other specified abnormal findings of blood chemistry: Secondary | ICD-10-CM | POA: Diagnosis not present

## 2023-10-25 DIAGNOSIS — G44311 Acute post-traumatic headache, intractable: Secondary | ICD-10-CM | POA: Diagnosis not present

## 2023-10-25 DIAGNOSIS — S069XAD Unspecified intracranial injury with loss of consciousness status unknown, subsequent encounter: Secondary | ICD-10-CM | POA: Diagnosis not present

## 2023-10-25 LAB — GLUCOSE, CAPILLARY: Glucose-Capillary: 96 mg/dL (ref 70–99)

## 2023-10-25 LAB — BASIC METABOLIC PANEL
Anion gap: 10 (ref 5–15)
BUN: 16 mg/dL (ref 6–20)
CO2: 18 mmol/L — ABNORMAL LOW (ref 22–32)
Calcium: 8.9 mg/dL (ref 8.9–10.3)
Chloride: 103 mmol/L (ref 98–111)
Creatinine, Ser: 0.68 mg/dL (ref 0.61–1.24)
GFR, Estimated: >60 mL/min (ref >60)
Glucose, Bld: 100 mg/dL — ABNORMAL HIGH (ref 70–99)
Potassium: 3.7 mmol/L (ref 3.5–5.1)
Sodium: 131 mmol/L — ABNORMAL LOW (ref 135–145)

## 2023-10-25 LAB — CBC
HCT: 35.1 % — ABNORMAL LOW (ref 39.0–52.0)
Hemoglobin: 12.5 g/dL — ABNORMAL LOW (ref 13.0–17.0)
MCH: 32.1 pg (ref 26.0–34.0)
MCHC: 35.6 g/dL (ref 30.0–36.0)
MCV: 90.2 fL (ref 80.0–100.0)
Platelets: 473 10*3/uL — ABNORMAL HIGH (ref 150–400)
RBC: 3.89 MIL/uL — ABNORMAL LOW (ref 4.22–5.81)
RDW: 12.2 % (ref 11.5–15.5)
WBC: 8.7 10*3/uL (ref 4.0–10.5)
nRBC: 0 % (ref 0.0–0.2)

## 2023-10-25 NOTE — Progress Notes (Signed)
PROGRESS NOTE   Subjective/Complaints:  Pt with continued headache. Just took tramadol. Headaches generally better. He's aware of discharge today  ROS: Patient denies fever, rash, sore throat, blurred vision, dizziness, nausea, vomiting, diarrhea, cough, shortness of breath or chest pain, joint or back/neck pain,   or mood change.   Objective:   No results found. Recent Labs    10/25/23 0558  WBC 8.7  HGB 12.5*  HCT 35.1*  PLT 473*   Recent Labs    10/24/23 0510 10/25/23 0558  NA 134* 131*  K 3.9 3.7  CL 105 103  CO2 22 18*  GLUCOSE 122* 100*  BUN 19 16  CREATININE 0.62 0.68  CALCIUM 9.2 8.9    Intake/Output Summary (Last 24 hours) at 10/25/2023 0758 Last data filed at 10/24/2023 1324 Gross per 24 hour  Intake 0 ml  Output --  Net 0 ml        Physical Exam: Vital Signs Blood pressure 120/70, pulse 65, temperature 97.8 F (36.6 C), resp. rate 17, height 5\' 8"  (1.727 m), weight 61.9 kg, SpO2 99%. Constitutional: No distress . Vital signs reviewed. HEENT: NCAT, EOMI, oral membranes moist Neck: supple Cardiovascular: RRR without murmur. No JVD    Respiratory/Chest: CTA Bilaterally without wheezes or rales. Normal effort    GI/Abdomen: BS +, non-tender, non-distended Ext: no clubbing, cyanosis, or edema Psych: flat Skin: C/D/I. No apparent lesions. MSK:      No apparent deformity. Normal neck rom. No TTP throughout neck and shoulders.        Neurologic exam:  Cognition: AAO to person, place, time and event.   Insight: Good  insight into current condition.  Sensation: To light touch reduced in distal RUE and RLE  CN: No further appreciable weakness in left face.  Intact. Strength: Antigravity and against resistance in all 4 extremities, 5/5 throughout--neuro exam stable 11/28  Assessment/Plan: 1. Functional deficits which require 3+ hours per day of interdisciplinary therapy in a comprehensive  inpatient rehab setting. Physiatrist is providing close team supervision and 24 hour management of active medical problems listed below. Physiatrist and rehab team continue to assess barriers to discharge/monitor patient progress toward functional and medical goals  Care Tool:  Bathing    Body parts bathed by patient: Right arm, Left arm, Chest, Abdomen, Front perineal area, Buttocks, Right upper leg, Left upper leg, Face, Right lower leg, Left lower leg         Bathing assist Assist Level: Supervision/Verbal cueing     Upper Body Dressing/Undressing Upper body dressing   What is the patient wearing?: Pull over shirt    Upper body assist Assist Level: Supervision/Verbal cueing    Lower Body Dressing/Undressing Lower body dressing      What is the patient wearing?: Pants, Underwear/pull up     Lower body assist Assist for lower body dressing: Supervision/Verbal cueing     Toileting Toileting    Toileting assist Assist for toileting: Moderate Assistance - Patient 50 - 74%     Transfers Chair/bed transfer  Transfers assist     Chair/bed transfer assist level: Independent     Locomotion Ambulation   Ambulation assist  Assist level: Supervision/Verbal cueing Assistive device: No Device Max distance: >300'   Walk 10 feet activity   Assist     Assist level: Supervision/Verbal cueing Assistive device: No Device   Walk 50 feet activity   Assist    Assist level: Supervision/Verbal cueing Assistive device: No Device    Walk 150 feet activity   Assist Walk 150 feet activity did not occur: Safety/medical concerns  Assist level: Supervision/Verbal cueing Assistive device: No Device    Walk 10 feet on uneven surface  activity   Assist Walk 10 feet on uneven surfaces activity did not occur: Safety/medical concerns   Assist level: Supervision/Verbal cueing     Wheelchair     Assist Is the patient using a wheelchair?: No Type of  Wheelchair: Manual    Wheelchair assist level: Dependent - Patient 0%      Wheelchair 50 feet with 2 turns activity    Assist        Assist Level: Dependent - Patient 0%   Wheelchair 150 feet activity     Assist      Assist Level: Dependent - Patient 0%   Blood pressure 120/70, pulse 65, temperature 97.8 F (36.6 C), resp. rate 17, height 5\' 8"  (1.727 m), weight 61.9 kg, SpO2 99%.  1. 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 Limited by headache, but easily distracted from it.  PT already supervision overall. SLP sustained attention an issue, Mod A currently.    11/28 dc home today: follow up with Dr. Shearon Stalls, r PCP, Neurosurgery and ENT.   2.  Antithrombotics: -DVT/anticoagulation:  Pharmaceutical: Lovenox             -antiplatelet therapy: Off ASA due to TBI/bleed 3. Persistent headaches/Pain Management: Continue tylenol qid. Fioricet/oxycodone ineffective --start gabapentin 300 mg bid with 600 mg at nights for sleep and pain per discussion w/Dr. Shearon Stalls 11-23: Add Topamax 25 mg twice daily. 11-24: Increase gabapentin to 600 mg 3 times daily.  In afternoon, severe worsening of headache limiting therapies, repeat CT head stable/improved.  Increase Topamax to 50 mg twice daily for tomorrow. 11/25 gabapentin and topamax being dosed aggressively. Topamax increases today  -will try tramadol prn for headaches, has tried fioricet and oxy 11.26: Gabapentin to 300 mg TID PRN and tramadol scheduled Q6H  11-27: Headache slightly improved with regimen, not using gabapentin as needed, can discontinue at discharge. 11/28 continue above regimen, use tramadol prn for breakthrough  4. Mood/Behavior/Sleep: LCSW to follow for evaluation and support.              -antipsychotic agents: N/A              --gabapentin 600 mg/hs for sleep and pain. Trazodone prn also on board.  5. Neuropsych/cognition: This patient may be intermittently capable of making decisions on his own behalf. 6. Skin/Wound Care: Routine pressure relief measures.  7. Fluids/Electrolytes/Nutrition: Daily BMET to monitor sodium. Continue 1200 cc FR             --change ensure to Ensure max to prevent surgar spikes. Will add prostat also  11-23: Wife bring in food from home, patient minimally eating hospital food, may complicate picture.  Will provide  education -good p.o.'s recorded.  11/25 po intake variable. Continue to encourage, food from home helps  8. FUO: Resolving. Attempt/encourage pulmonary hygiene with flutter valve qid             --leucocytosis on last CBC 11/15-->monitor for signs of infection and for rise in white count --recheck CBC in am.   -11-23: No recurrent fever, leukocytosis stable/resolved, monitor  10. Hyponatremia: Cerebral salt wasting?Marland Kitchen On Ure-Na since 11/20 and salt tabs 3 mg TID             --monitor with daily checks as continues to fluctuate--> 132-->134-->134-->132-->128--132             --po intake seems to be variable. Needs to be fed per chart review   11-23: NA 133, continue current regimen through Monday and if no worsening hyponatremia can start to wean salt tabs  11/24: Na 129 today; change Cozaar 15 mg daily to Norvasc 5 mg daily starting 11/25. Already on 0.5 mg/kg dosing for Ure-Na and 3 G TID salt tabs with fluid restriction.  Patient has been refusing urine powder due to taste; encouraged him to take and advised if continued downtrend may need more aggressive measures.   11/25 sodium holding at 129--no changes today  11-26: Sodium 132; improving.  Continue current regimen until normalized  11-20; sodium 134.  Can trial off of Urena at discharge with follow-up labs next week.  11/28 Sodium sl down to 131 today without any changes to regimen. Will not go home with urea   -I think he's  ok to go home   -continue fluid restriction 1200cc, salt tabs   -recheck labs monday  11. Pre-renal azotemia: Likely due to FR and dose of lasix before? -11/25 recurrent--push adequate fluids up to 1200cc. ?relax FR?   -recheck bmet tomorrow 11-26: BUN and creatinine normal  12. Dizziness: Order vestibular evaluation. Check orthostatic vitals to monitor for drop with activity             --may need scopolamine patch is symptoms limiting and BP stable  11-23: Was extremely orthostatic on exam with PT and OT today, add TED hose and abdominal binder, may need addition of midodrine due to fluid restriction from hyponatremia  11-24: Reportedly much improved with therapies with binders and TED hose, with blood pressure into the 160s; maintained in the 140s with TED hose only.  11/26: Reduce gabapentin to 300 mg TID PRN due to dizziness, lethargy. Orthostats still good with TEDs.  11-27: Symptoms improved  13. HTN: Monitor BP TID--continue Cozaar and metoprolol. Will set hold parameters.    10/25/2023    5:02 AM 10/24/2023    7:37 PM 10/24/2023    2:24 PM  Vitals with BMI  Systolic 120 104 409  Diastolic 70 62 62  Pulse 65 73 66   11-27 bp's controlled  14. T2DM: Monitor BS ac/hs. Hgb A1C- 8.4.  Po intake variable with fluctuating BS.              --continue Insulin glargline 10 units daily with SSI for elevated BS/tighter control.              --transition Insulin to metformin as intake improves. Ensure changed to ensure Max as lower carbs.   11-23: Difficult to track carbs due to wife bringing in food.  Will monitor trends today and adjust long-acting as necessary  11/24:BMP BUN/Cr looks good; intakes stable; add Metformin 500 mg BID and reduce semglee to 5 U  for 11/25  11/25 cbg's fair--adjusting regimen as above--observe today  11-26: BUN/creatinine with good control.  DC Semglee.  Increase metformin to 1000 mg twice daily.  11-27: Some mild highs, but overall  well-controlled. Recent Labs    10/24/23 1707 10/24/23 2158 10/25/23 0606  GLUCAP 122* 154* 96     15. CAD s/p PTCS/stent LAD: followed by Dr. Sherri Sear. Treated with metoprolol, Cozaar, ASA, lipitor             --monitor for symptoms with activity.  16. Abdominal pain: Likely due to GERD--had BM today --PPI resumed.    Senokot   1 tab twice daily due to constipation, last bowel movement 11/23  -also on miralax bid Last bowel movement medium, type II, 11-25 11-27: Had diarrhea stool overnight.  Patient endorses some food intolerances.  Added as needed Imodium. 11/28 no stools in last 24 hours 17. Left TMJ fracture: No difficulty or pain with chewing. No facial pain or increase in weakness.  --Left facial weakness was noted by NS at admission. Monitor for now.   - 11/26: Endorsing hearing deficits post-trauma, not appreciated on exam.  OP f/u ENT Dr. Elijah Birk, resume ciprodex BID  18. Seizure prophylaxis: Continue Keppra bid.  No seizures since admission.    LOS: 6 days A FACE TO FACE EVALUATION WAS PERFORMED  Ranelle Oyster 10/25/2023, 7:58 AM

## 2023-10-26 ENCOUNTER — Telehealth: Payer: Self-pay | Admitting: Physical Medicine & Rehabilitation

## 2023-10-26 DIAGNOSIS — E871 Hypo-osmolality and hyponatremia: Secondary | ICD-10-CM

## 2023-10-26 NOTE — Telephone Encounter (Signed)
Ordered bmet so that patient could have sodium checked today. He may go to any [facility] for blood draw.

## 2023-10-27 LAB — BASIC METABOLIC PANEL
BUN/Creatinine Ratio: 21 (ref 10–24)
BUN: 16 mg/dL (ref 8–27)
CO2: 19 mmol/L — ABNORMAL LOW (ref 20–29)
Calcium: 9.3 mg/dL (ref 8.6–10.2)
Chloride: 99 mmol/L (ref 96–106)
Creatinine, Ser: 0.75 mg/dL — ABNORMAL LOW (ref 0.76–1.27)
Glucose: 126 mg/dL — ABNORMAL HIGH (ref 70–99)
Potassium: 4.4 mmol/L (ref 3.5–5.2)
Sodium: 132 mmol/L — ABNORMAL LOW (ref 134–144)
eGFR: 103 mL/min/{1.73_m2} (ref >59)

## 2023-10-29 ENCOUNTER — Telehealth: Payer: Self-pay

## 2023-10-29 NOTE — Telephone Encounter (Signed)
Transitional Care call--[name]    Are you/is patient experiencing any problems since coming home? No appetite Are there any questions regarding any aspect of care? No Are there any questions regarding medications administration/dosing? Yes, reviewed med list Are meds being taken as prescribed?  Yes Patient should review meds with caller to confirm Have there been any falls? NO Has Home Health been to the house and/or have they contacted you? Outpatient. No If not, have you tried to contact them? No Can we help you contact them? Told her call back if they don't contact her.  Are bowels and bladder emptying properly? Yes Are there any unexpected incontinence issues? No If applicable, is patient following bowel/bladder programs? Any fevers, problems with breathing, unexpected pain? No Are there any skin problems or new areas of breakdown? No  Has the patient/family member arranged specialty MD follow up (ie cardiology/neurology/renal/surgical/etc)? Yes  Can we help arrange? Does the patient need any other services or support that we can help arrange? No  Are caregivers following through as expected in assisting the patient? Yes Has the patient quit smoking, drinking alcohol, or using drugs as recommended? No  Appointment time 3:20 pm, arrive time 3:00 pm with Dr. Shearon Stalls [address] [phone]

## 2023-11-07 ENCOUNTER — Encounter
Payer: BC Managed Care – PPO | Attending: Physical Medicine and Rehabilitation | Admitting: Physical Medicine and Rehabilitation

## 2023-11-07 ENCOUNTER — Encounter: Payer: BC Managed Care – PPO | Admitting: Physical Medicine and Rehabilitation

## 2023-11-07 ENCOUNTER — Encounter: Payer: Self-pay | Admitting: Physical Medicine and Rehabilitation

## 2023-11-07 VITALS — BP 112/67 | HR 65 | Ht 68.0 in | Wt 130.4 lb

## 2023-11-07 DIAGNOSIS — S069X9S Unspecified intracranial injury with loss of consciousness of unspecified duration, sequela: Secondary | ICD-10-CM | POA: Insufficient documentation

## 2023-11-07 DIAGNOSIS — I951 Orthostatic hypotension: Secondary | ICD-10-CM | POA: Diagnosis present

## 2023-11-07 DIAGNOSIS — R2689 Other abnormalities of gait and mobility: Secondary | ICD-10-CM | POA: Insufficient documentation

## 2023-11-07 DIAGNOSIS — R519 Headache, unspecified: Secondary | ICD-10-CM | POA: Insufficient documentation

## 2023-11-07 DIAGNOSIS — S0219XD Other fracture of base of skull, subsequent encounter for fracture with routine healing: Secondary | ICD-10-CM

## 2023-11-07 DIAGNOSIS — S069X9D Unspecified intracranial injury with loss of consciousness of unspecified duration, subsequent encounter: Secondary | ICD-10-CM | POA: Diagnosis not present

## 2023-11-07 DIAGNOSIS — S0219XS Other fracture of base of skull, sequela: Secondary | ICD-10-CM | POA: Diagnosis present

## 2023-11-07 NOTE — Patient Instructions (Addendum)
    Coletta Memos, MD. Call.   Specialty: Neurosurgery Why: for follow up appointment Contact information: [address] [place] [address] [phone]    Follow up with neurosurgery as above  Stop norvasc, topomax and tramadol. I will message you before the end of this week about stopping keppra.  Follow up with me at the end of January to look into work leave extension vs return to work.

## 2023-11-07 NOTE — Progress Notes (Signed)
Subjective:    Patient ID: Charles Dorsey, male    DOB: [birth year], 62 y.o.   MRN: [id number]  HPI  Charles Dorsey is a 62 y.o. year old male  who  has a past medical history of Acid reflux, CAD (coronary artery disease), Diabetes mellitus without complication (HCC), Hyperlipidemia, Hypothyroidism, Insomnia, and Thyroid nodule.   They are presenting to PM&R clinic for follow up related to IPR stay 11/22 to 10/25/23 for TBI s/p fall off a 10 foot ladder while intoxicated, with resulting R SDH/SAH and L mastoid/parietal fracture.  Seen today with his wife and daughter, who is his caregiver and Nurse, learning disability.   Interval Hx:  - Therapies: First appointment is 12/19. He's getting PT, OT, and SLP. He is trying to do home exercises with walking; "when I wake up, if I try to go down I am done".    - Follow ups: Sees PCP already. Rescheduling ENT appointment for 12/26 with Dr. Allena Katz. Do not have follow up with Dr. Alphonzo Lemmings.   - Falls:none  - WUJ:WJXB  - Medications:   Takes tramadol when headaches are severe; otherwise manages with Tylenol. Does think the tramadol causes stomach upset and diarrhea.   Have not taken topomax because of concerns with dizziness as side effect  Not on laxatives.  Stopped salt tabs after 2 days "because he gave me some pushback on the pills".  Still on anti-seizure medication, BP meds,  and the metformin.    - Other concerns: Ongoing severe headaches, top of his head, bilateral, alternating sifdes, 4/10 pain whereas was 7/10 in hospital.   Has pain in his eyes when he tried to focus his eyes.    Patient states he "blacks out" several times a day; daughter clarifies he gets dizzy and sees spots when he gets up quickly, stays 1-2 minutes then goes away.   Pain Inventory Average Pain 5 Pain Right Now 5 My pain is  pounding  LOCATION OF PAIN  head  BOWEL Number of stools per week: 7-14  BLADDER Normal  Mobility walk without assistance how many minutes can you  walk? 5-10 ability to climb steps?  yes do you drive?  no  Function employed # of hrs/week 40 on leave right now  Neuro/Psych weakness dizziness  Prior Studies Any changes since last visit?  no  Physicians involved in your care Any changes since last visit?  no   Family History  Problem Relation Age of Onset   Heart disease Father    Diabetes Father    Hyperlipidemia Father    Social History   Socioeconomic History   Marital status: Married    Spouse name: Not on file   Number of children: 5   Years of education: Not on file   Highest education level: Not on file  Occupational History   Not on file  Tobacco Use   Smoking status: Every Day    Types: Cigarettes   Smokeless tobacco: Never  Vaping Use   Vaping status: Never Used  Substance and Sexual Activity   Alcohol use: Yes    Comment: occ   Drug use: Never   Sexual activity: Not on file  Other Topics Concern   Not on file  Social History Narrative   Not on file   Social Determinants of Health   Financial Resource Strain: Not on file  Food Insecurity: No Food Insecurity (10/11/2023)   Hunger Vital Sign    Worried About Running Out of Food  in the Last Year: Never true    Ran Out of Food in the Last Year: Never true  Transportation Needs: No Transportation Needs (10/11/2023)   PRAPARE - Administrator, Civil Service (Medical): No    Lack of Transportation (Non-Medical): No  Physical Activity: Not on file  Stress: Not on file  Social Connections: Not on file   Past Surgical History:  Procedure Laterality Date   CORONARY STENT INTERVENTION N/A 03/18/2020   Procedure: CORONARY STENT INTERVENTION;  Surgeon: Elder Negus, MD;  Location: [facility];  Service: Cardiovascular;  Laterality: N/A;   CORONARY ULTRASOUND/IVUS N/A 03/18/2020   Procedure: Intravascular Ultrasound/IVUS;  Surgeon: Elder Negus, MD;  Location: [facility];  Service: Cardiovascular;  Laterality:  N/A;   LEFT HEART CATH AND CORONARY ANGIOGRAPHY N/A 03/18/2020   Procedure: LEFT HEART CATH AND CORONARY ANGIOGRAPHY;  Surgeon: Elder Negus, MD;  Location: [facility];  Service: Cardiovascular;  Laterality: N/A;   Past Medical History:  Diagnosis Date   Acid reflux    CAD (coronary artery disease)    Diabetes mellitus without complication (HCC)    Hyperlipidemia    Hypothyroidism    Insomnia    Thyroid nodule    BP 112/67   Pulse 65   Ht 5\' 8"  (1.727 m)   Wt 130 lb 6.4 oz (59.1 kg)   SpO2 99%   BMI 19.83 kg/m   Opioid Risk Score:   Fall Risk Score:  `1  Depression screen [facility] 2/9     11/07/2023    3:39 PM  Depression screen PHQ 2/9  Decreased Interest 3  Down, Depressed, Hopeless 0  PHQ - 2 Score 3  Altered sleeping 3  Tired, decreased energy 3  Change in appetite 2  Feeling bad or failure about yourself  0  Trouble concentrating 3  Moving slowly or fidgety/restless 3  Suicidal thoughts 0  PHQ-9 Score 17  Difficult doing work/chores Somewhat difficult     Review of Systems  Constitutional:  Positive for unexpected weight change (wt loss).  Eyes: Negative.   Respiratory: Negative.    Cardiovascular: Negative.   Gastrointestinal: Negative.   Endocrine:       High blood sugar  Genitourinary: Negative.   Musculoskeletal: Negative.   Skin: Negative.   Allergic/Immunologic: Negative.   Neurological:  Positive for dizziness and weakness.  Hematological: Negative.   Psychiatric/Behavioral:  Positive for dysphoric mood.   All other systems reviewed and are negative.      Objective:   Physical Exam  On exam, 20 point BP drop with sit to stand on orthostatic vitals; resolved with sitting down.   Constitutional: No apparent distress. Appropriate appearance for age.  HENT: No JVD. Neck Supple. Trachea midline. Atraumatic, normocephalic.  Eyes: PERRLA. EOMI - difficulty with convergence. Visual fields grossly intact.  Cardiovascular: RRR, no  murmurs/rub/gallops. No Edema. Peripheral pulses 2+  Respiratory: CTAB. No rales, rhonchi, or wheezing. On RA.  Abdomen: + bowel sounds, normoactive. No distention or tenderness.  Skin: C/D/I. No apparent lesions. MSK:      No apparent deformity.       Neurologic exam:  Cognition: AAO to person, place, time and event. Language: Fluent, No substitutions or neoglisms. No dysarthria.  Memory: No apparent deficits  Insight: Good  insight into current condition.  Mood: Pleasant affect, appropriate mood.  Sensation: To light touch intact in BL UEs and LEs  Reflexes: 2+ in BL UE and  LEs. Negative Hoffman's and babinski signs bilaterally.  CN: 2-12 grossly intact.  Coordination: No apparent tremors. No ataxia on FTN, HTS bilaterally.  + Balance difficulty with tandem gait. Spasticity: MAS 0 in all extremities.  Strength:                RUE: 5/5 SA, 5/5 EF, 5/5 EE, 5/5 WE, 5/5 FF, 5/5 FA                 LUE: 5/5 SA, 5/5 EF, 5/5 EE, 5/5 WE, 5/5 FF, 5/5 FA                 RLE: 5/5 HF, 5/5 KE, 5/5 DF, 5/5 EHL, 5/5 PF                 LLE:  5/5 HF, 5/5 KE, 5/5 DF, 5/5 EHL, 5/5 PF   Cervical Exam:  Inspection: Shoulder girdles were   even. The cervical lordotic curvature was   WNL.  Palpatory exam: . There was no muscle spasm. There were  no trigger points noted. ROM: Flexion  WNL, Extension  WNL, Rotation  WNL b/l, Sidebending  WNL b/l  Radiculopathy Special Tests: Spurling's maneuver : - UMN signs: - Balance Impairement: + Lhermitte Sign: -  Information in () parenthesis is normals/details of specific exam.       Assessment & Plan:   Charles Dorsey is a 62 y.o. year old male  who  has a past medical history of Acid reflux, CAD (coronary artery disease), Diabetes mellitus without complication (HCC), Hyperlipidemia, Hypothyroidism, Insomnia, and Thyroid nodule.    They are presenting to PM&R clinic for follow up related to IPR stay 11/22 to 10/25/23 for TBI s/p fall off a 10 foot ladder  while intoxicated, with resulting R SDH/SAH and L mastoid/parietal fracture.  Traumatic brain injury with loss of consciousness, sequela (HCC) Closed fracture of temporal bone, sequela (HCC)  Follow up with neurosurgery:   Coletta Memos, MD. Call.   Specialty: Neurosurgery Why: for follow up appointment Contact information: [address] [place] [address] [phone]    Rescheduling ENT appointment for 12/26   I will message you before the end of this week about stopping keppra.  - addendum 12/13: On chart review, no witness seizures or + EEG during hospitalization. No noted indication for extending keppra past 7 day window. Messaged patient stating they may stop this medication and monitor for signs of seizure.   Follow up with me at the end of January to look into work leave extension vs return to work.   Frequent headaches Improving, currently managing with tylenol due to side effects from topomax and tramadol; agree with continuing tylenol and re-assessing at follow up if no improvement  Balance problem Orthostatic hypotension   Stop norvasc, topomax and tramadol.

## 2023-11-09 ENCOUNTER — Encounter: Payer: Self-pay | Admitting: Physical Medicine and Rehabilitation

## 2023-11-14 DIAGNOSIS — I951 Orthostatic hypotension: Secondary | ICD-10-CM | POA: Insufficient documentation

## 2023-11-14 DIAGNOSIS — R2689 Other abnormalities of gait and mobility: Secondary | ICD-10-CM | POA: Insufficient documentation

## 2023-11-15 ENCOUNTER — Ambulatory Visit: Payer: BC Managed Care – PPO | Admitting: Physical Therapy

## 2023-11-15 ENCOUNTER — Ambulatory Visit: Payer: BC Managed Care – PPO | Attending: Physical Medicine and Rehabilitation | Admitting: Occupational Therapy

## 2023-11-15 ENCOUNTER — Other Ambulatory Visit: Payer: Self-pay

## 2023-11-15 ENCOUNTER — Encounter: Payer: Self-pay | Admitting: Speech Pathology

## 2023-11-15 ENCOUNTER — Encounter: Payer: Self-pay | Admitting: Physical Therapy

## 2023-11-15 ENCOUNTER — Ambulatory Visit: Payer: BC Managed Care – PPO | Admitting: Speech Pathology

## 2023-11-15 DIAGNOSIS — R41841 Cognitive communication deficit: Secondary | ICD-10-CM

## 2023-11-15 DIAGNOSIS — S069X9S Unspecified intracranial injury with loss of consciousness of unspecified duration, sequela: Secondary | ICD-10-CM

## 2023-11-15 DIAGNOSIS — R42 Dizziness and giddiness: Secondary | ICD-10-CM | POA: Diagnosis present

## 2023-11-15 DIAGNOSIS — S069X9A Unspecified intracranial injury with loss of consciousness of unspecified duration, initial encounter: Secondary | ICD-10-CM | POA: Insufficient documentation

## 2023-11-15 DIAGNOSIS — M6281 Muscle weakness (generalized): Secondary | ICD-10-CM

## 2023-11-15 NOTE — Therapy (Signed)
.. OUTPATIENT SPEECH LANGUAGE PATHOLOGY EVALUATION   Patient Name: Charles Dorsey MRN: [id number] DOB:23-Oct-1961, 62 y.o., male Today's Date: 11/15/2023  PCP: [facility] REFERRING PROVIDER: Jacquelynn Cree PA-C  END OF SESSION:  End of Session - 11/15/23 1020     Visit Number 1    Number of Visits 17    Date for SLP Re-Evaluation 01/16/24    SLP Start Time 1015    SLP Stop Time  1055    SLP Time Calculation (min) 40 min    Activity Tolerance Patient tolerated treatment well             Past Medical History:  Diagnosis Date   Acid reflux    CAD (coronary artery disease)    Diabetes mellitus without complication (HCC)    Hyperlipidemia    Hypothyroidism    Insomnia    Thyroid nodule    Past Surgical History:  Procedure Laterality Date   CORONARY STENT INTERVENTION N/A 03/18/2020   Procedure: CORONARY STENT INTERVENTION;  Surgeon: Elder Negus, MD;  [facility]: [facility];  Service: Cardiovascular;  Laterality: N/A;   CORONARY ULTRASOUND/IVUS N/A 03/18/2020   Procedure: Intravascular Ultrasound/IVUS;  Surgeon: Elder Negus, MD;  Location: [facility];  Service: Cardiovascular;  Laterality: N/A;   LEFT HEART CATH AND CORONARY ANGIOGRAPHY N/A 03/18/2020   Procedure: LEFT HEART CATH AND CORONARY ANGIOGRAPHY;  Surgeon: Elder Negus, MD;  [facility]: [facility];  Service: Cardiovascular;  Laterality: N/A;   Patient Active Problem List   Diagnosis Date Noted   Balance problem 11/14/2023   Orthostatic hypotension 11/14/2023   Type 2 diabetes mellitus (HCC) 10/24/2023   Hyponatremia 10/24/2023   Acute blood loss anemia 10/24/2023   Frequent headaches 10/24/2023   TBI (traumatic brain injury) (HCC) 10/19/2023   Malnutrition of moderate degree 10/09/2023   SDH (subdural hematoma) (HCC) 10/07/2023   Closed fracture of temporal bone (HCC) 10/07/2023   Coronary artery disease of native artery of native heart with stable angina  pectoris (HCC) 12/25/2022   Type 1 diabetes mellitus without complication (HCC) 09/11/2020   Essential hypertension 09/11/2020   Unstable angina (HCC) 03/18/2020   Family history of early CAD 03/18/2020   Hyperlipidemia 03/18/2020   Post PTCA 03/18/2020   Exertional chest pain 03/10/2020    ONSET DATE: 10/23/2023   REFERRING DIAG: W09.8J1B (ICD-10-CM) - Traumatic brain injury with loss of consciousness, sequela (HCC  THERAPY DIAG:  Cognitive communication deficit  Rationale for Evaluation and Treatment: Rehabilitation  SUBJECTIVE:   SUBJECTIVE STATEMENT: Pt was pleasant and cooperative throughout today's assessment.  Pt accompanied by: family member; daughter [name]  PERTINENT HISTORY: Charles Dorsey is a 61 y.o. year old male  who  has a past medical history of Acid reflux, CAD (coronary artery disease), Diabetes mellitus without complication (HCC), Hyperlipidemia, Hypothyroidism, Insomnia, and Thyroid nodule  PAIN:  Are you having pain? No  FALLS: Has patient fallen in last 6 months?  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  DIAGNOSTIC FINDINGS: Per chart Review  FINDINGS: CT HEAD FINDINGS   Brain: There is subarachnoid hemorrhage along the right cerebral convexity. There is also a or mm  subdural hematoma along the right cerebral convexity. Subdural blood products extend along the right tentorial leaflet in the posterior falx no mastoid. No hydrocephalus. No evidence of intraventricular extension. No CT evidence of an acute cortical infarct.   Vascular: No hyperdense vessel or unexpected calcification.   Skull: There is a  longitudinal fracture of the left temporal bone with blood products in the left-sided mastoid air cells and in the middle ear. Fracture extends into the left parietal bone. See separately dictated temporal bone CT for better characterization.   Sinuses/Orbits: Pansinus mucosal thickening. Orbits are unremarkable.   Other: None.    Electronically Signed   By: Lorenza Cambridge M.D.   On: 10/07/2023 14:47  COGNITION: Overall cognitive status: Impaired and family report they feel he is functioning at baseline - pt reports he feels he has always had difficulty with attention Areas of impairment:  Attention: Impaired: Divided Memory: Impaired: Short term Executive function: Impaired: Organization, Planning, and Error awareness Functional deficits: Difficulty completing pill box task; however, they do not have a pill box at home.  COGNITIVE COMMUNICATION: Following directions: Follows multi-step commands inconsistently  Auditory comprehension: WFL; english as 2nd language Verbal expression: WFL Functional communication: Impaired: reports premorbid difficulty with remembering conversations.  ORAL MOTOR EXAMINATION: Overall status: Did not assess Comments: NA  STANDARDIZED ASSESSMENTS: SLUMS: 23/30  Medication management task - has never used a pill box. Required clarification on using both sides of pill box for pills AM and PM. Problem solving - forgot he had more pills.   PATIENT REPORTED OUTCOME MEASURES (PROM): NA   TODAY'S TREATMENT:                                                                                                                                         DATE:    PATIENT EDUCATION: Education details: cognitive-communication impairment Person educated: Patient and Child(ren) Education method: Explanation Education comprehension: needs further education   GOALS:  Denied therapy services at this time.  ASSESSMENT:  CLINICAL IMPRESSION: Pt is a 62 yo male  who presents to ST OP for evaluation post TBI. Pt was accompanied by daughter. They both feel as if pt is back to baseline.  Pt was assessed using SLUMs - pt scored 23/30 indicating mild cognitive impairment. SLP had pt complete medication management task.Pt does not use a medication box at home and has never used them. Once SLP described how to complete AM and PM, pt only made 1 error.  SLP observed some deficits in attention and auditory memory, though pt and daughter report this is baseline. SLP rec skilled ST services to address cognitive-communication impairment; however, pt and daughter decline at this time. They understand they can return if they notice any difficulty once returning to doing more things independently.       OBJECTIVE IMPAIRMENTS: include attention and memory. These impairments are limiting patient from return to work,  managing medications, managing appointments, managing finances, household responsibilities, and effectively communicating at home and in community. Factors affecting potential to achieve goals and functional outcome are cooperation/participation level.. Patient will benefit from skilled SLP services to address above impairments and improve overall function.  REHAB POTENTIAL: DENIED THERAPY SERVICES  PLAN:  SLP FREQUENCY: one time visit  SLP DURATION: other: NA  PLANNED INTERVENTIONS:  NA    [employer], CCC-SLP 11/15/2023, 10:21 AM

## 2023-11-15 NOTE — Therapy (Signed)
OUTPATIENT PHYSICAL THERAPY NEURO EVALUATION   Patient Name: Charles Dorsey MRN: [id number] DOB:Nov 13, 1961, 62 y.o., male Today's Date: 11/15/2023   PCP: [facility] REFERRING PROVIDER: Jacquelynn Cree, PA-C  END OF SESSION:  PT End of Session - 11/15/23 0929     Visit Number 1    Number of Visits 2    Date for PT Re-Evaluation 12/13/23    Authorization Type BC[employer]    PT Start Time 0930    PT Stop Time 1010    PT Time Calculation (min) 40 min    Activity Tolerance Patient tolerated treatment well             Past Medical History:  Diagnosis Date   Acid reflux    CAD (coronary artery disease)    Diabetes mellitus without complication (HCC)    Hyperlipidemia    Hypothyroidism    Insomnia    Thyroid nodule    Past Surgical History:  Procedure Laterality Date   CORONARY STENT INTERVENTION N/A 03/18/2020   Procedure: CORONARY STENT INTERVENTION;  Surgeon: Elder Negus, MD;  Location: [facility];  Service: Cardiovascular;  Laterality: N/A;   CORONARY ULTRASOUND/IVUS N/A 03/18/2020   Procedure: Intravascular Ultrasound/IVUS;  Surgeon: Elder Negus, MD;  Location: [facility];  Service: Cardiovascular;  Laterality: N/A;   LEFT HEART CATH AND CORONARY ANGIOGRAPHY N/A 03/18/2020   Procedure: LEFT HEART CATH AND CORONARY ANGIOGRAPHY;  Surgeon: Elder Negus, MD;  Location: [facility];  Service: Cardiovascular;  Laterality: N/A;   Patient Active Problem List   Diagnosis Date Noted   Balance problem 11/14/2023   Orthostatic hypotension 11/14/2023   Type 2 diabetes mellitus (HCC) 10/24/2023   Hyponatremia 10/24/2023   Acute blood loss anemia 10/24/2023   Frequent headaches 10/24/2023   TBI (traumatic brain injury) (HCC) 10/19/2023   Malnutrition of moderate degree 10/09/2023   SDH (subdural hematoma) (HCC) 10/07/2023   Closed fracture of temporal bone (HCC) 10/07/2023   Coronary artery disease of native artery of native  heart with stable angina pectoris (HCC) 12/25/2022   Type 1 diabetes mellitus without complication (HCC) 09/11/2020   Essential hypertension 09/11/2020   Unstable angina (HCC) 03/18/2020   Family history of early CAD 03/18/2020   Hyperlipidemia 03/18/2020   Post PTCA 03/18/2020   Exertional chest pain 03/10/2020    ONSET DATE: 10/07/23 fall off ladder  REFERRING DIAG: S06.9X9S (ICD-10-CM) - Traumatic brain injury with loss of consciousness, sequela (HCC)  THERAPY DIAG:  Traumatic brain injury with loss of consciousness, sequela (HCC)  Dizziness and giddiness  Muscle weakness (generalized)  Rationale for Evaluation and Treatment: Rehabilitation  SUBJECTIVE:  SUBJECTIVE STATEMENT: Pt states he can walk good. Pt reports a little headache controlled with medication. Pt states his balance has been good now and can do all things at home. Does note some dizziness with initially waking up -- feels his vision is blurred/not clear.  Pt accompanied by: self  PERTINENT HISTORY: From inpatient rehab discharge summary: Charles Dorsey is a 62 y.o. male with history of T2DM, CAD, ETOH use who was admitted on 10/07/23 after fall off a ladder with amnesia of events and TBI. He was found to have SAH and ADH along right cerebral convexity with SDH extending along right tenotorial leaflet and posterior falx, fracture of left temporal bone with bold products in left mastoid ear cells and middle ear with fracture extending into left parietal bone and Left TMJ. JADYN OMDAHL was admitted to rehab 10/19/2023 to 10/25/2023.  PAIN:  Are you having pain? Yes: NPRS scale: 1; at worst gets 4-5 Pain location: headache Pain description: dull/pounding Aggravating factors: not known Relieving factors: medication  PRECAUTIONS:  None  RED FLAGS: None   WEIGHT BEARING RESTRICTIONS: No  FALLS: Has patient fallen in last 6 months? No and Yes. 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  DIAGNOSTIC FINDINGS: IMPRESSION: 1. Interval decrease in size and conspicuity of the right frontal lobe hemorrhagic contusion. 2. Interval decrease in the volume of subarachnoid hemorrhage along the right cerebral convexity. 3. Interval decrease in leftward midline shift now measuring 3 mm, previously 7 mm. 4. Evolving right frontal convexity subdural hematoma measuring up to 5 mm. Electronically Signed By: Lorenza Cambridge M.D. On: 10/21/2023 16:43  COGNITION: Overall cognitive status: Within functional limits for tasks assessed   SENSATION: Intermittent L hand tingling  COORDINATION: WNL  EDEMA:  none  MUSCLE TONE: none noted  MUSCLE LENGTH: Did not assess  DTRs:  Did not assess  POSTURE: rounded shoulders  LOWER EXTREMITY ROM:     Active  Right Eval Left Eval  Hip flexion    Hip extension    Hip abduction    Hip adduction    Hip internal rotation    Hip external rotation    Knee flexion    Knee extension    Ankle dorsiflexion    Ankle plantarflexion    Ankle inversion    Ankle eversion     (Blank rows = not tested)  UPPER EXTREMITY MMT: grossly 5/5  LOWER EXTREMITY MMT:    MMT Right Eval Left Eval  Hip flexion 4 5  Hip extension 4+ 4+  Hip abduction 4- 4-  Hip adduction    Hip internal rotation    Hip external rotation    Knee flexion 5 5  Knee extension 5 5  Ankle  dorsiflexion    Ankle plantarflexion    Ankle inversion    Ankle eversion    (Blank rows = not tested)  BED MOBILITY:  Sit to supine Complete Independence Supine to sit Complete Independence Rolling to Right Complete Independence Rolling to Left Complete Independence  TRANSFERS: Independent with all transfers  STAIRS: Level of Assistance: Complete Independence Stair  Negotiation Technique: Alternating Pattern  with No Rails Number of Stairs: 5  Height of Stairs: 4-6"    GAIT: Gait pattern: WFL and step through pattern Distance walked: 100' Assistive device utilized: None Level of assistance: Complete Independence  CANALITH TESTING: Dix-Hallpike (-) R, (+) L with L beating upward torsional nystagmus for ~15 sec  FUNCTIONAL TESTING:  30 second sit to stand: 16x FGA: 27/30  [facility] PT Assessment - 11/15/23 0001       Functional Gait  Assessment   Gait assessed  Yes    Gait Level Surface Walks 20 ft in less than 5.5 sec, no assistive devices, good speed, no evidence for imbalance, normal gait pattern, deviates no more than 6 in outside of the 12 in walkway width.    Change in Gait Speed Able to smoothly change walking speed without loss of balance or gait deviation. Deviate no more than 6 in outside of the 12 in walkway width.    Gait with Horizontal Head Turns Performs head turns smoothly with slight change in gait velocity (eg, minor disruption to smooth gait path), deviates 6-10 in outside 12 in walkway width, or uses an assistive device.    Gait with Vertical Head Turns Performs head turns with no change in gait. Deviates no more than 6 in outside 12 in walkway width.    Gait and Pivot Turn Pivot turns safely within 3 sec and stops quickly with no loss of balance.    Step Over Obstacle Is able to step over 2 stacked shoe boxes taped together (9 in total height) without changing gait speed. No evidence of imbalance.    Gait with Narrow Base of Support Ambulates 7-9 steps.     Gait with Eyes Closed Walks 20 ft, uses assistive device, slower speed, mild gait deviations, deviates 6-10 in outside 12 in walkway width. Ambulates 20 ft in less than 9 sec but greater than 7 sec.    Ambulating Backwards Walks 20 ft, no assistive devices, good speed, no evidence for imbalance, normal gait    Steps Alternating feet, no rail.    Total Score 27    FGA comment: 27/30              TODAY'S TREATMENT:                                                                                                                              DATE: 11/15/23 Canalith repositioning: Epley maneuver x2    PATIENT EDUCATION: Education details: Exam findings, POC, BPPV education Person educated: Patient and Child(ren) Education method: Explanation, Demonstration, and Handouts Education comprehension: verbalized understanding, returned demonstration, and needs further education  HOME EXERCISE PROGRAM: Did not provide  GOALS: Goals reviewed with patient? Yes  SHORT/LONG TERM GOALS: Target date: 12/13/2023   Pt will be ind with self canalith repositioning as indicated Baseline: Goal status: INITIAL  2.  Pt will demo no s/s of BPPV with canalith testing  Baseline:  Goal status: INITIAL  3.  Pt will demo FGA of 30/30 for safe return to work Baseline:  Goal status: INITIAL     ASSESSMENT:  CLINICAL IMPRESSION: Patient is a 62 y.o. M who was seen today for physical therapy evaluation and treatment s/p TBI on 10/07/23. Assessment significant for (+) BPPV in L posterior canalith likely causing pt's dizziness and slight decrease in balance from baseline. Did not see any nystagmus on 2nd check with Dix-Hallpike so hopefully it is now cleared. Pt does demo some hip weakness but this appears equal and may have been prior to his fall. Pt does note some continued headache symptoms. Pt will benefit from PT follow for his BPPV and ensure safe return to work.   OBJECTIVE IMPAIRMENTS: Abnormal  gait, decreased balance, decreased strength, dizziness, and pain.   ACTIVITY LIMITATIONS: standing, transfers, bed mobility, and locomotion level  PARTICIPATION LIMITATIONS: community activity and occupation  PERSONAL FACTORS: Age, Fitness, Past/current experiences, Profession, and Time since onset of injury/illness/exacerbation are also affecting patient's functional outcome.   REHAB POTENTIAL: Good  CLINICAL DECISION MAKING: Stable/uncomplicated  EVALUATION COMPLEXITY: Low  PLAN:  PT FREQUENCY:  1 more visit follow up  PT DURATION: 4 weeks  PLANNED INTERVENTIONS: 97164- PT Re-evaluation, 97110-Therapeutic exercises, 97530- Therapeutic activity, 97112- Neuromuscular re-education, 97535- Self Care, 16109- Manual therapy, 95992- Canalith repositioning, Patient/Family education, Balance training, Cryotherapy, and Moist heat  PLAN FOR NEXT SESSION: Recheck for BPPV. Canalith repositioning as indicated. Educate pt on home epley. Check lifting and pushing tasks for work   [facility] April Ma L Haworth, PT 11/15/2023, 12:07 PM

## 2023-11-15 NOTE — Therapy (Signed)
  OUTPATIENT OCCUPATIONAL THERAPY NEURO Treatment  Patient Name: Charles Dorsey MRN: [id number] DOB:[birth year], 62 y.o., male Today's Date: 11/15/2023  PCP: [facility] REFERRING PROVIDER: Dr. Shearon Stalls Pt arrived accompanied by his dtr who assisted with translation. 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 Pt declines OT eval at this time. Pt was made aware that he can return if he has needs in the future. No charge for today's visit.    Margarita Bobrowski, OT 11/15/2023, 2:38 PM

## 2023-11-22 ENCOUNTER — Ambulatory Visit (INDEPENDENT_AMBULATORY_CARE_PROVIDER_SITE_OTHER): Payer: BC Managed Care – PPO

## 2023-11-29 ENCOUNTER — Ambulatory Visit: Payer: Self-pay

## 2023-12-24 ENCOUNTER — Encounter: Payer: Self-pay | Admitting: Physical Medicine and Rehabilitation

## 2024-02-26 ENCOUNTER — Other Ambulatory Visit: Payer: Self-pay | Admitting: Cardiology

## 2024-02-26 ENCOUNTER — Other Ambulatory Visit: Payer: Self-pay

## 2024-02-26 DIAGNOSIS — R079 Chest pain, unspecified: Secondary | ICD-10-CM

## 2024-02-26 DIAGNOSIS — I25118 Atherosclerotic heart disease of native coronary artery with other forms of angina pectoris: Secondary | ICD-10-CM
